# Patient Record
Sex: Female | Born: 2000 | Race: White | Hispanic: No | Marital: Single | Smoking: Never smoker
Health system: Southern US, Community
[De-identification: ages and names within clinical notes are randomized; demographics above are authoritative.]

## PROBLEM LIST (undated history)

## (undated) DIAGNOSIS — R112 Nausea with vomiting, unspecified: Secondary | ICD-10-CM

## (undated) DIAGNOSIS — F419 Anxiety disorder, unspecified: Secondary | ICD-10-CM

## (undated) DIAGNOSIS — E119 Type 2 diabetes mellitus without complications: Secondary | ICD-10-CM

## (undated) DIAGNOSIS — J45909 Unspecified asthma, uncomplicated: Secondary | ICD-10-CM

## (undated) DIAGNOSIS — F32A Depression, unspecified: Secondary | ICD-10-CM

## (undated) HISTORY — PX: ADENOIDECTOMY: SUR15

## (undated) HISTORY — PX: APPENDECTOMY: SHX54

## (undated) HISTORY — DX: Nausea with vomiting, unspecified: R11.2

## (undated) HISTORY — PX: OTHER SURGICAL HISTORY: SHX169

---
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
## 2018-01-09 ENCOUNTER — Ambulatory Visit: Payer: PRIVATE HEALTH INSURANCE | Admitting: Pediatrics

## 2018-01-19 ENCOUNTER — Other Ambulatory Visit: Payer: Self-pay | Admitting: Pediatrics

## 2018-01-19 DIAGNOSIS — N915 Oligomenorrhea, unspecified: Secondary | ICD-10-CM

## 2018-02-15 ENCOUNTER — Other Ambulatory Visit: Payer: Self-pay | Admitting: Pediatrics

## 2018-02-15 DIAGNOSIS — N915 Oligomenorrhea, unspecified: Secondary | ICD-10-CM

## 2018-04-08 ENCOUNTER — Other Ambulatory Visit: Payer: Self-pay | Admitting: Pediatrics

## 2018-04-08 DIAGNOSIS — J309 Allergic rhinitis, unspecified: Secondary | ICD-10-CM

## 2018-04-08 DIAGNOSIS — J4520 Mild intermittent asthma, uncomplicated: Secondary | ICD-10-CM

## 2018-05-14 ENCOUNTER — Encounter (HOSPITAL_COMMUNITY): Payer: Self-pay | Admitting: Emergency Medicine

## 2018-05-14 ENCOUNTER — Other Ambulatory Visit: Payer: Self-pay

## 2018-05-14 ENCOUNTER — Emergency Department (HOSPITAL_COMMUNITY)
Admission: EM | Admit: 2018-05-14 | Discharge: 2018-05-15 | Disposition: A | Discharge status: Home / Self Care | Payer: PRIVATE HEALTH INSURANCE | Attending: Emergency Medicine | Admitting: Emergency Medicine

## 2018-05-14 DIAGNOSIS — R51 Headache: Secondary | ICD-10-CM | POA: Diagnosis not present

## 2018-05-14 DIAGNOSIS — Z79899 Other long term (current) drug therapy: Secondary | ICD-10-CM | POA: Insufficient documentation

## 2018-05-14 DIAGNOSIS — J45909 Unspecified asthma, uncomplicated: Secondary | ICD-10-CM | POA: Diagnosis not present

## 2018-05-14 DIAGNOSIS — R519 Headache, unspecified: Secondary | ICD-10-CM

## 2018-05-14 LAB — GROUP A STREP BY PCR: Group A Strep by PCR: NOT DETECTED

## 2018-05-14 MED ORDER — KETOROLAC TROMETHAMINE 30 MG/ML IJ SOLN
30.0000 mg | Freq: Once | INTRAMUSCULAR | Status: AC
  Administered 2018-05-14: 30 mg via INTRAVENOUS
  Filled 2018-05-14: qty 1

## 2018-05-14 MED ORDER — METOCLOPRAMIDE HCL 5 MG/ML IJ SOLN
10.0000 mg | Freq: Once | INTRAMUSCULAR | Status: AC
  Administered 2018-05-14: 10 mg via INTRAVENOUS
  Filled 2018-05-14: qty 2

## 2018-05-14 MED ORDER — DIPHENHYDRAMINE HCL 25 MG PO CAPS
25.0000 mg | ORAL_CAPSULE | Freq: Once | ORAL | Status: AC
  Administered 2018-05-14: 25 mg via ORAL
  Filled 2018-05-14: qty 1

## 2018-05-14 MED ORDER — SODIUM CHLORIDE 0.9 % IV BOLUS
1000.0000 mL | Freq: Once | INTRAVENOUS | Status: AC
  Administered 2018-05-14: 1000 mL via INTRAVENOUS

## 2018-05-14 NOTE — ED Triage Notes (Signed)
Pt c/o headache since Sunday with n/v.

## 2018-05-15 MED ORDER — BUTALBITAL-APAP-CAFFEINE 50-325-40 MG PO TABS: 1.0000 | ORAL_TABLET | ORAL | 0 refills | Status: DC | PRN

## 2018-05-15 NOTE — Discharge Instructions (Addendum)
Follow-up with your doctor for recheck if needed.  Return here for any worsening symptoms

## 2018-05-16 NOTE — ED Provider Notes (Signed)
[facility]   CSN: [id number] Arrival date & time: 05/14/18  2038     History   Chief Complaint Chief Complaint  Patient presents with  . Headache    HPI Ruth DixonSierra M King is a 17 y.o. female.  HPI   Ruth DixonSierra M King is a 17 y.o. female who presents to the Emergency Department complaining of gradual onset of frontal headache for 1 day.  She describes the pain is a throbbing sensation across the front of her head and into her temples.  Headache is associated with nausea and vomiting.  She has been able to keep down small amounts of fluids.  She reports history of headaches that are similar to her current headache.  She is tried over-the-counter medications with minimal relief.  She denies sudden onset, neck pain, neck stiffness, fever, chest pain, shortness of breath, or visual changes.   Past Medical History:  Diagnosis Date  . Asthma   . Nausea & vomiting     Patient Active Problem List   Diagnosis Date Noted  . 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 Asthma, chronic 02/25/2016  . PCOS (polycystic ovarian syndrome) 09/23/2015  . Development delay 09/23/2015  . Learning disability 09/23/2015  . Nausea & vomiting     Past Surgical History:  Procedure Laterality Date  . ADENOIDECTOMY    . APPENDECTOMY    . tubes in ears       OB History   None      Home Medications    Prior to Admission medications   Medication Sig Start Date End Date Taking? Authorizing Provider  albuterol (PROVENTIL HFA;VENTOLIN HFA) 108 (90 Base) MCG/ACT inhaler Inhale 2 puffs into the lungs every 6 (six) hours as needed for wheezing or shortness of breath. 05/26/16  Yes Dettinger, Elige RadonJoshua A, MD  cetirizine (ZYRTEC) 10 MG tablet TAKE ONE TABLET BY MOUTH EVERY DAY Patient taking differently: Take 10 mg by mouth at bedtime.  04/09/18  Yes [name], Carol L, MD  fluticasone (FLONASE) 50 MCG/ACT nasal spray Place 2 sprays into both nostrils daily. Patient  taking differently: Place 2 sprays into both nostrils daily as needed for allergies or rhinitis.  05/26/16  Yes Dettinger, Elige RadonJoshua A, MD  LARIN FE 1.5/30 1.5-30 MG-MCG tablet TAKE 1 TABLET BY MOUTH DAILY Patient taking differently: Take 1 tablet by mouth at bedtime.  02/15/18  Yes [name], Carol L, MD  montelukast (SINGULAIR) 10 MG tablet TAKE ONE TABLET BY MOUTH AT BEDTIME Patient taking differently: Take 10 mg by mouth every morning.  10/30/17  Yes [name], Carol L, MD  butalbital-acetaminophen-caffeine (FIORICET, ESGIC) [id number] MG tablet Take 1 tablet by mouth every 4 (four) hours as needed for headache. 05/15/18 05/15/19  Pauline Ausriplett, Roselynne Lortz, PA-C    Family History Family History  Problem Relation Age of Onset  . Asthma Mother   . Gout Father   . Seizures Maternal Grandmother     Social History Social History   Tobacco Use  . Smoking status: Never Smoker  . Smokeless tobacco: Never Used  Substance Use Topics  . Alcohol use: No  . Drug use: No     Allergies   Bee venom; Amoxicillin; Azithromycin; and Cephalosporins   Review of Systems Review of Systems  Constitutional: Positive for appetite change. Negative for activity change and fever.  HENT: Negative for facial swelling and trouble swallowing.   Eyes: Negative for photophobia, pain and visual disturbance.  Respiratory: Negative for shortness of breath.   Cardiovascular:  Negative for chest pain.  Gastrointestinal: Positive for nausea and vomiting.  Musculoskeletal: Negative for neck pain and neck stiffness.  Skin: Negative for rash and wound.  Neurological: Positive for headaches. Negative for dizziness, syncope, facial asymmetry, speech difficulty, weakness and numbness.  Psychiatric/Behavioral: Negative for confusion and decreased concentration.  All other systems reviewed and are negative.    Physical Exam Updated Vital Signs BP 123/62 (BP Location: Left Arm)   Pulse 77   Temp 99.4 F (37.4 C) (Oral)   Resp 18    Ht 6' (1.829 m)   Wt 113.4 kg   LMP 05/06/2018   SpO2 100%   BMI 33.91 kg/m   Physical Exam  Constitutional: She is oriented to person, place, and time. She appears well-developed and well-nourished. No distress.  HENT:  Head: Normocephalic.  Mouth/Throat: Oropharynx is clear and moist.  Eyes: Pupils are equal, round, and reactive to light. Conjunctivae and EOM are normal.  Neck: Normal range of motion and phonation normal. Neck supple. No spinous process tenderness and no muscular tenderness present. No neck rigidity. No Kernig's sign noted.  Cardiovascular: Normal rate, regular rhythm and intact distal pulses.  Pulmonary/Chest: Effort normal and breath sounds normal. No respiratory distress.  Abdominal: Soft. She exhibits no distension. There is no tenderness.  Musculoskeletal: Normal range of motion.  Neurological: She is alert and oriented to person, place, and time. She has normal strength. No cranial nerve deficit or sensory deficit. Gait normal. GCS eye subscore is 4. GCS verbal subscore is 5. GCS motor subscore is 6.  Reflex Scores:      Tricep reflexes are 2+ on the right side and 2+ on the left side.      Bicep reflexes are 2+ on the right side and 2+ on the left side. CN III-XII grossly intact.  Speech clear, no pronator drift.  Grip strength is strong and symmetrical bilaterally.  Nml finger nose testing bilaterally  Skin: Skin is warm and dry. Capillary refill takes less than 2 seconds. No rash noted.  Psychiatric: She has a normal mood and affect. Thought content normal.  Nursing note and vitals reviewed.    ED Treatments / Results  Labs (all labs ordered are listed, but only abnormal results are displayed) Labs Reviewed  GROUP A STREP BY PCR    EKG None  Radiology No results found.  Procedures Procedures (including critical care time)  Medications Ordered in ED Medications  sodium chloride 0.9 % bolus 1,000 mL (0 mLs Intravenous Stopped 05/15/18 0034)    ketorolac (TORADOL) 30 MG/ML injection 30 mg (30 mg Intravenous Given 05/14/18 2255)  diphenhydrAMINE (BENADRYL) capsule 25 mg (25 mg Oral Given 05/14/18 2255)  metoCLOPramide (REGLAN) injection 10 mg (10 mg Intravenous Given 05/14/18 2255)     Initial Impression / Assessment and Plan / ED Course  I have reviewed the triage vital signs and the nursing notes.  Pertinent labs & imaging results that were available during my care of the patient were reviewed by me and considered in my medical decision making (see chart for details).     Patient with gradual onset of frontal headache.  No nuchal rigidity.  Labs reviewed.  Abdomen is soft and nontender on exam.  On recheck, patient reports feeling better after medications.  Nausea has resolved.  She states that she is ready for discharge home agrees to close outpatient follow-up if needed.  Return precautions discussed.  Final Clinical Impressions(s) / ED Diagnoses   Final diagnoses:  Headache  disorder    ED Discharge Orders         Ordered    butalbital-acetaminophen-caffeine (FIORICET, ESGIC) 50-325-40 MG tablet  Every 4 hours PRN     05/15/18 0033           Pauline Ausriplett, Elayah Klooster, PA-C 05/16/18 1309    Bethann BerkshireZammit, Joseph, MD 05/17/18 1219

## 2018-06-08 ENCOUNTER — Other Ambulatory Visit: Payer: Self-pay | Admitting: Pediatrics

## 2018-06-08 DIAGNOSIS — N915 Oligomenorrhea, unspecified: Secondary | ICD-10-CM

## 2018-06-08 NOTE — Telephone Encounter (Signed)
Last seen 12/27/08  Dr Darlyn Read  Dr Oswaldo Done PCP

## 2018-06-09 NOTE — Telephone Encounter (Signed)
Will call to schedule appt  

## 2018-06-20 ENCOUNTER — Encounter: Payer: Self-pay | Admitting: Family Medicine

## 2018-06-20 ENCOUNTER — Ambulatory Visit (INDEPENDENT_AMBULATORY_CARE_PROVIDER_SITE_OTHER): Payer: PRIVATE HEALTH INSURANCE | Admitting: Family Medicine

## 2018-06-20 VITALS — BP 150/100 | HR 97 | Temp 99.0°F | Ht 72.0 in | Wt 281.0 lb

## 2018-06-20 DIAGNOSIS — J069 Acute upper respiratory infection, unspecified: Secondary | ICD-10-CM | POA: Diagnosis not present

## 2018-06-20 LAB — CULTURE, GROUP A STREP

## 2018-06-20 LAB — RAPID STREP SCREEN (MED CTR MEBANE ONLY): Strep Gp A Ag, IA W/Reflex: NEGATIVE

## 2018-06-20 NOTE — Progress Notes (Signed)
BP (!) 150/100   Pulse 97   Temp 99 F (37.2 C) (Oral)   Ht 6' (1.829 m)   Wt 281 lb (127.5 kg)   BMI 38.11 kg/m    Subjective:    Patient ID: Ruth King, female    DOB: 01/31/2001, 17 y.o.   MRN: [id number]  HPI: Ruth King is a 17 y.o. female presenting on 06/20/2018 for Sore Throat (Wants meds for headaches refilled and inhaler); Headache; Nausea; and Generalized Body Aches   HPI Cough and body aches and congestion and sore throat Patient is coming in for cough and congestion and body aches and sore throat this been going on for just under a day.  She says it all started yesterday and is been worsening overnight.  She does admit to having a sister that is been sick since 3 days ago with similar symptoms but she just started last night.  She went to work today and just was generally not feeling well and achy so she came home from work and that is why she is here today.  She denies having used anything for it yet but is trying to stay hydrated.  She says she has been getting a little bit of diarrhea today and a little bit of nausea today as well.  She normally takes Singulair and Flonase and Zyrtec as needed.  Relevant past medical, surgical, family and social history reviewed and updated as indicated. Interim medical history since our last visit reviewed. Allergies and medications reviewed and updated.  Review of Systems  Constitutional: Positive for chills and fever.  HENT: Positive for congestion, postnasal drip, rhinorrhea, sinus pressure, sneezing and sore throat. Negative for ear discharge and ear pain.   Eyes: Negative for pain, redness and visual disturbance.  Respiratory: Positive for cough. Negative for chest tightness and shortness of breath.   Cardiovascular: Negative for chest pain and leg swelling.  Genitourinary: Negative for difficulty urinating and dysuria.  Musculoskeletal: Positive for myalgias. Negative for back pain and gait problem.  Skin: Negative  for rash.  Neurological: Negative for light-headedness and headaches.  Psychiatric/Behavioral: Negative for agitation and behavioral problems.  All other systems reviewed and are negative.   Per HPI unless specifically indicated above   Allergies as of 06/20/2018      Reactions   Bee Venom Shortness Of Breath   Amoxicillin Hives   Azithromycin Other (See Comments)   Not effective   Cephalosporins Hives      Medication List        Accurate as of 06/20/18  6:45 PM. Always use your most recent med list.          albuterol 108 (90 Base) MCG/ACT inhaler Commonly known as:  PROVENTIL HFA;VENTOLIN HFA Inhale 2 puffs into the lungs every 6 (six) hours as needed for wheezing or shortness of breath.   butalbital-acetaminophen-caffeine 50-325-40 MG tablet Commonly known as:  FIORICET, ESGIC Take 1 tablet by mouth every 4 (four) hours as needed for headache.   cetirizine 10 MG tablet Commonly known as:  ZYRTEC TAKE ONE TABLET BY MOUTH EVERY DAY   fluticasone 50 MCG/ACT nasal spray Commonly known as:  FLONASE Place 2 sprays into both nostrils daily.   montelukast 10 MG tablet Commonly known as:  SINGULAIR TAKE ONE TABLET BY MOUTH AT BEDTIME   norethindrone-ethinyl estradiol-iron 1.5-30 MG-MCG tablet Commonly known as:  MICROGESTIN FE,GILDESS FE,LOESTRIN FE Take 1 tablet by mouth daily. Needs to be seen for more refills  Objective:    BP (!) 150/100   Pulse 97   Temp 99 F (37.2 C) (Oral)   Ht 6' (1.829 m)   Wt 281 lb (127.5 kg)   BMI 38.11 kg/m   Wt Readings from Last 3 Encounters:  06/20/18 281 lb (127.5 kg) (>99 %, Z= 2.67)*  05/14/18 250 lb (113.4 kg) (>99 %, Z= 2.47)*  12/27/17 263 lb 8 oz (119.5 kg) (>99 %, Z= 2.54)*   * Growth percentiles are based on CDC (Girls, 2-20 Years) data.    Physical Exam  Constitutional: She is oriented to person, place, and time. She appears well-developed and well-nourished. No distress.  HENT:  Right Ear: Tympanic  membrane, external ear and ear canal normal.  Left Ear: Tympanic membrane, external ear and ear canal normal.  Nose: Mucosal edema and rhinorrhea present. No epistaxis. Right sinus exhibits no maxillary sinus tenderness and no frontal sinus tenderness. Left sinus exhibits no maxillary sinus tenderness and no frontal sinus tenderness.  Mouth/Throat: Uvula is midline and mucous membranes are normal. Posterior oropharyngeal edema present. No oropharyngeal exudate, posterior oropharyngeal erythema or tonsillar abscesses.  Eyes: Conjunctivae are normal.  Cardiovascular: Normal rate, regular rhythm, normal heart sounds and intact distal pulses.  No murmur heard. Pulmonary/Chest: Effort normal and breath sounds normal. No respiratory distress. She has no wheezes.  Musculoskeletal: Normal range of motion. She exhibits no edema.  Neurological: She is alert and oriented to person, place, and time. Coordination normal.  Skin: Skin is warm and dry. No rash noted. She is not diaphoretic.  Psychiatric: She has a normal mood and affect. Her behavior is normal.  Vitals reviewed.       Assessment & Plan:   Problem List Items Addressed This Visit    None    Visit Diagnoses    Viral upper respiratory infection    -  Primary   Relevant Orders   Rapid Strep Screen ([facility] ONLY)    Recommended Flonase and Mucinex and ibuprofen and nasal saline and humidifier and call back if worsens or does not improve after 5 to 10 days.  Follow up plan: Return if symptoms worsen or fail to improve.  Counseling provided for all of the vaccine components Orders Placed This Encounter  Procedures  . Rapid Strep Screen ([facility] ONLY)    Arville Care, MD [facility] 06/20/2018, 6:45 PM

## 2018-06-28 ENCOUNTER — Other Ambulatory Visit: Payer: Self-pay | Admitting: *Deleted

## 2018-06-28 DIAGNOSIS — J309 Allergic rhinitis, unspecified: Secondary | ICD-10-CM

## 2018-06-28 DIAGNOSIS — J4520 Mild intermittent asthma, uncomplicated: Secondary | ICD-10-CM

## 2018-06-28 MED ORDER — MONTELUKAST SODIUM 10 MG PO TABS: 10.0000 mg | ORAL_TABLET | Freq: Every day | ORAL | 0 refills | Status: DC

## 2018-07-02 DIAGNOSIS — Z79899 Other long term (current) drug therapy: Secondary | ICD-10-CM | POA: Diagnosis not present

## 2018-07-02 DIAGNOSIS — J45909 Unspecified asthma, uncomplicated: Secondary | ICD-10-CM | POA: Diagnosis not present

## 2018-07-02 DIAGNOSIS — R1012 Left upper quadrant pain: Secondary | ICD-10-CM | POA: Diagnosis present

## 2018-07-03 ENCOUNTER — Emergency Department (HOSPITAL_COMMUNITY)
Admission: EM | Admit: 2018-07-03 | Discharge: 2018-07-03 | Disposition: A | Discharge status: Home / Self Care | Payer: PRIVATE HEALTH INSURANCE | Attending: Emergency Medicine | Admitting: Emergency Medicine

## 2018-07-03 ENCOUNTER — Encounter (HOSPITAL_COMMUNITY): Payer: Self-pay | Admitting: *Deleted

## 2018-07-03 ENCOUNTER — Other Ambulatory Visit: Payer: Self-pay

## 2018-07-03 DIAGNOSIS — R1012 Left upper quadrant pain: Secondary | ICD-10-CM

## 2018-07-03 LAB — CBC WITH DIFFERENTIAL/PLATELET
Basophils Absolute: 0 10*3/uL (ref 0.0–0.1)
Basophils Relative: 0 %
EOS ABS: 0.2 10*3/uL (ref 0.0–0.7)
EOS PCT: 1 %
HCT: 38.6 % (ref 36.0–46.0)
Hemoglobin: 12.5 g/dL (ref 12.0–15.0)
LYMPHS ABS: 2.9 10*3/uL (ref 0.7–4.0)
LYMPHS PCT: 25 %
MCH: 28.6 pg (ref 26.0–34.0)
MCHC: 32.4 g/dL (ref 30.0–36.0)
MCV: 88.3 fL (ref 78.0–100.0)
MONO ABS: 0.6 10*3/uL (ref 0.1–1.0)
MONOS PCT: 5 %
Neutro Abs: 8 10*3/uL — ABNORMAL HIGH (ref 1.7–7.7)
Neutrophils Relative %: 69 %
PLATELETS: 369 10*3/uL (ref 150–400)
RBC: 4.37 MIL/uL (ref 3.87–5.11)
RDW: 13.9 % (ref 11.5–15.5)
WBC: 11.7 10*3/uL — ABNORMAL HIGH (ref 4.0–10.5)

## 2018-07-03 LAB — COMPREHENSIVE METABOLIC PANEL
ALBUMIN: 3.4 g/dL — AB (ref 3.5–5.0)
ALK PHOS: 71 U/L (ref 38–126)
ALT: 29 U/L (ref 0–44)
ANION GAP: 7 (ref 5–15)
AST: 29 U/L (ref 15–41)
BILIRUBIN TOTAL: 0.2 mg/dL — AB (ref 0.3–1.2)
BUN: 11 mg/dL (ref 6–20)
CALCIUM: 8.7 mg/dL — AB (ref 8.9–10.3)
CO2: 22 mmol/L (ref 22–32)
CREATININE: 0.52 mg/dL (ref 0.44–1.00)
Chloride: 111 mmol/L (ref 98–111)
GFR calc Af Amer: >60 mL/min (ref >60)
GFR calc non Af Amer: >60 mL/min (ref >60)
GLUCOSE: 166 mg/dL — AB (ref 70–99)
Potassium: 3.8 mmol/L (ref 3.5–5.1)
SODIUM: 140 mmol/L (ref 135–145)
Total Protein: 6.9 g/dL (ref 6.5–8.1)

## 2018-07-03 LAB — POC URINE PREG, ED: Preg Test, Ur: NEGATIVE

## 2018-07-03 LAB — URINALYSIS, ROUTINE W REFLEX MICROSCOPIC
Bilirubin Urine: NEGATIVE
Glucose, UA: 150 mg/dL — AB
Hgb urine dipstick: NEGATIVE
KETONES UR: NEGATIVE mg/dL
Leukocytes, UA: NEGATIVE
NITRITE: NEGATIVE
PROTEIN: NEGATIVE mg/dL
SPECIFIC GRAVITY, URINE: 1.02 (ref 1.005–1.030)
pH: 6 (ref 5.0–8.0)

## 2018-07-03 LAB — LIPASE, BLOOD: Lipase: 28 U/L (ref 11–51)

## 2018-07-03 MED ORDER — GI COCKTAIL ~~LOC~~
30.0000 mL | Freq: Once | ORAL | Status: AC
  Administered 2018-07-03: 30 mL via ORAL

## 2018-07-03 MED ORDER — DICYCLOMINE HCL 10 MG PO CAPS
ORAL_CAPSULE | ORAL | Status: AC
  Filled 2018-07-03: qty 1

## 2018-07-03 MED ORDER — DICYCLOMINE HCL 20 MG PO TABS: 20.0000 mg | ORAL_TABLET | Freq: Three times a day (TID) | ORAL | 0 refills | Status: DC

## 2018-07-03 MED ORDER — DICYCLOMINE HCL 10 MG PO CAPS
10.0000 mg | ORAL_CAPSULE | Freq: Once | ORAL | Status: AC
  Administered 2018-07-03: 10 mg via ORAL

## 2018-07-03 MED ORDER — PANTOPRAZOLE SODIUM 40 MG PO TBEC: 40.0000 mg | DELAYED_RELEASE_TABLET | Freq: Every day | ORAL | 2 refills | Status: DC

## 2018-07-03 MED ORDER — GI COCKTAIL ~~LOC~~
ORAL | Status: AC
  Filled 2018-07-03: qty 30

## 2018-07-03 NOTE — ED Provider Notes (Signed)
[facility] EMERGENCY DEPARTMENT Provider Note   CSN: [id number] Arrival date & time: 07/02/18  2357     History   Chief Complaint Chief Complaint  Patient presents with  . Abdominal Pain    HPI Ruth King is a 17 y.o. female.  Patient presents to the ER for abdominal pain.  Patient reports that she had onset of left upper abdominal pain after dinner tonight.  Initially the pain was severe and constant, now is improving somewhat.  She reports that the pain is waxing, waning, coming and going currently.  Initially she had nausea but that has resolved.  She has not had any diarrhea or constipation.  Denies urinary symptoms.  She has not had any fever.     Past Medical History:  Diagnosis Date  . Asthma   . Nausea & vomiting     Patient Active Problem List   Diagnosis Date Noted  . 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 Asthma, chronic 02/25/2016  . PCOS (polycystic ovarian syndrome) 09/23/2015  . Development delay 09/23/2015  . Learning disability 09/23/2015  . Nausea & vomiting     Past Surgical History:  Procedure Laterality Date  . ADENOIDECTOMY    . APPENDECTOMY    . tubes in ears       OB History   None      Home Medications    Prior to Admission medications   Medication Sig Start Date End Date Taking? Authorizing Provider  albuterol (PROVENTIL HFA;VENTOLIN HFA) 108 (90 Base) MCG/ACT inhaler Inhale 2 puffs into the lungs every 6 (six) hours as needed for wheezing or shortness of breath. 05/26/16   Dettinger, Elige Radon, MD  butalbital-acetaminophen-caffeine (FIORICET, ESGIC) [phone] MG tablet Take 1 tablet by mouth every 4 (four) hours as needed for headache. 05/15/18 05/15/19  Triplett, Tammy, PA-C  cetirizine (ZYRTEC) 10 MG tablet TAKE ONE TABLET BY MOUTH EVERY DAY Patient taking differently: Take 10 mg by mouth at bedtime.  04/09/18   Johna Sheriff, MD  dicyclomine (BENTYL) 20 MG tablet Take 1 tablet (20 mg total) by mouth 3 (three) times  daily before meals. 07/03/18   Gilda Crease, MD  fluticasone (FLONASE) 50 MCG/ACT nasal spray Place 2 sprays into both nostrils daily. Patient taking differently: Place 2 sprays into both nostrils daily as needed for allergies or rhinitis.  05/26/16   Dettinger, Elige Radon, MD  montelukast (SINGULAIR) 10 MG tablet Take 1 tablet (10 mg total) by mouth at bedtime. (Needs to be seen before next refill) 06/28/18   Johna Sheriff, MD  norethindrone-ethinyl estradiol-iron Elmarie Shiley FE 1.5/30) 1.5-30 MG-MCG tablet Take 1 tablet by mouth daily. Needs to be seen for more refills 06/08/18   Johna Sheriff, MD  pantoprazole (PROTONIX) 40 MG tablet Take 1 tablet (40 mg total) by mouth daily. 07/03/18   Gilda Crease, MD    Family History Family History  Problem Relation Age of Onset  . Asthma Mother   . Gout Father   . Seizures Maternal Grandmother     Social History Social History   Tobacco Use  . Smoking status: Never Smoker  . Smokeless tobacco: Never Used  Substance Use Topics  . Alcohol use: No  . Drug use: No     Allergies   Bee venom; Amoxicillin; Azithromycin; and Cephalosporins   Review of Systems Review of Systems  Gastrointestinal: Positive for abdominal pain and nausea.  All other systems reviewed and are negative.    Physical Exam  Updated Vital Signs BP 124/78 (BP Location: Right Arm)   Pulse 97   Temp 97.9 F (36.6 C) (Oral)   Resp 18   Ht 6' (1.829 m)   Wt 127.4 kg   LMP 06/06/2018   SpO2 100%   BMI 38.09 kg/m   Physical Exam  Constitutional: She is oriented to person, place, and time. She appears well-developed and well-nourished. No distress.  HENT:  Head: Normocephalic and atraumatic.  Right Ear: Hearing normal.  Left Ear: Hearing normal.  Nose: Nose normal.  Mouth/Throat: Oropharynx is clear and moist and mucous membranes are normal.  Eyes: Pupils are equal, round, and reactive to light. Conjunctivae and EOM are normal.  Neck: Normal  range of motion. Neck supple.  Cardiovascular: Regular rhythm, S1 normal and S2 normal. Exam reveals no gallop and no friction rub.  No murmur heard. Pulmonary/Chest: Effort normal and breath sounds normal. No respiratory distress. She exhibits no tenderness.  Abdominal: Soft. Normal appearance and bowel sounds are normal. There is no hepatosplenomegaly. There is tenderness in the left upper quadrant. There is no rebound, no guarding, no tenderness at McBurney's point and negative Murphy's sign. No hernia.  Musculoskeletal: Normal range of motion.  Neurological: She is alert and oriented to person, place, and time. She has normal strength. No cranial nerve deficit or sensory deficit. Coordination normal. GCS eye subscore is 4. GCS verbal subscore is 5. GCS motor subscore is 6.  Skin: Skin is warm, dry and intact. No rash noted. No cyanosis.  Psychiatric: She has a normal mood and affect. Her speech is normal and behavior is normal. Thought content normal.  Nursing note and vitals reviewed.    ED Treatments / Results  Labs (all labs ordered are listed, but only abnormal results are displayed) Labs Reviewed  CBC WITH DIFFERENTIAL/PLATELET - Abnormal; Notable for the following components:      Result Value   WBC 11.7 (*)    Neutro Abs 8.0 (*)    All other components within normal limits  COMPREHENSIVE METABOLIC PANEL - Abnormal; Notable for the following components:   Glucose, Bld 166 (*)    Calcium 8.7 (*)    Albumin 3.4 (*)    Total Bilirubin 0.2 (*)    All other components within normal limits  URINALYSIS, ROUTINE W REFLEX MICROSCOPIC - Abnormal; Notable for the following components:   Glucose, UA 150 (*)    All other components within normal limits  LIPASE, BLOOD  POC URINE PREG, ED    EKG None  Radiology No results found.  Procedures Procedures (including critical care time)  Medications Ordered in ED Medications  gi cocktail suspension (has no administration in time  range)  dicyclomine (BENTYL) 10 MG capsule (has no administration in time range)  gi cocktail (Maalox,Lidocaine,Donnatal) (30 mLs Oral Given 07/03/18 0127)  dicyclomine (BENTYL) capsule 10 mg (10 mg Oral Given 07/03/18 0127)     Initial Impression / Assessment and Plan / ED Course  I have reviewed the triage vital signs and the nursing notes.  Pertinent labs & imaging results that were available during my care of the patient were reviewed by me and considered in my medical decision making (see chart for details).     Resents to the ER for evaluation of abdominal pain that began after eating spaghetti for dinner tonight.  Pain was left upper abdomen.  Initially the pain was constant but at arrival to the ER it was coming and going.  She was given GI  cocktail and Bentyl and pain has completely resolved.  Her examination when she was having pain revealed very slight epigastric discomfort, predominantly left upper quadrant tenderness.  She did not have any right upper quadrant tenderness, negative Murphy's.  LFTs normal.  Lipase normal.  Urinalysis unremarkable.  Likely related to acid reflux, possibly intestinal cramping.  She has been having normal bowel movements, no concern for obstruction.  Will treat with Protonix, follow-up with GI if no improvement.  Final Clinical Impressions(s) / ED Diagnoses   Final diagnoses:  Left upper quadrant pain    ED Discharge Orders         Ordered    pantoprazole (PROTONIX) 40 MG tablet  Daily     07/03/18 0218    dicyclomine (BENTYL) 20 MG tablet  3 times daily before meals     07/03/18 0218           Gilda Crease, MD 07/03/18 [phone]

## 2018-07-03 NOTE — ED Triage Notes (Signed)
Pt c/o left upper abdominal pain that started tonight; pt states she feels nauseous but has not vomited; pt denies any diarrhea and states her last BM was today and it was normal

## 2018-07-05 ENCOUNTER — Other Ambulatory Visit: Payer: Self-pay | Admitting: Pediatrics

## 2018-07-05 DIAGNOSIS — N915 Oligomenorrhea, unspecified: Secondary | ICD-10-CM

## 2018-07-16 ENCOUNTER — Ambulatory Visit: Payer: PRIVATE HEALTH INSURANCE | Admitting: Pediatrics

## 2018-07-16 ENCOUNTER — Encounter: Payer: Self-pay | Admitting: Pediatrics

## 2018-07-16 VITALS — BP 119/81 | HR 89 | Temp 98.1°F | Ht 72.0 in | Wt 272.8 lb

## 2018-07-16 DIAGNOSIS — N915 Oligomenorrhea, unspecified: Secondary | ICD-10-CM

## 2018-07-16 DIAGNOSIS — I10 Essential (primary) hypertension: Secondary | ICD-10-CM

## 2018-07-16 DIAGNOSIS — J309 Allergic rhinitis, unspecified: Secondary | ICD-10-CM

## 2018-07-16 MED ORDER — NORETHIN ACE-ETH ESTRAD-FE 1.5-30 MG-MCG PO TABS: 1.0000 | ORAL_TABLET | Freq: Every day | ORAL | 1 refills | Status: DC

## 2018-07-16 MED ORDER — CETIRIZINE HCL 10 MG PO TABS: 10.0000 mg | ORAL_TABLET | Freq: Every day | ORAL | 3 refills | Status: DC

## 2018-07-16 MED ORDER — MONTELUKAST SODIUM 10 MG PO TABS: 10.0000 mg | ORAL_TABLET | Freq: Every day | ORAL | 3 refills | Status: DC

## 2018-07-16 NOTE — Progress Notes (Signed)
  Subjective:   Patient ID: Ruth King, female    DOB: 04/03/2001, 17 y.o.   MRN: [id number] CC: Contraception  HPI: Ruth King is a 17 y.o. female   Has been feeling well. No trouble recently with knee. Graduated from high school this past spring.  Now working at [employer] in [place], lifting, walking, carrying things all day.  Trying to keep sodas down, drinks sometimes one a day, sometimes a gatorade or green tea as well. Drinking water regularly.  Having regular periods on OCP.  Some ongoing allergy symptoms, cetirizine and singulair help some.  Relevant past medical, surgical, family and social history reviewed. Allergies and medications reviewed and updated. Social History   Tobacco Use  Smoking Status Never Smoker  Smokeless Tobacco Never Used   ROS: Per HPI   Objective:    BP 119/81   Pulse 89   Temp 98.1 F (36.7 C) (Oral)   Ht 6' (1.829 m)   Wt 272 lb 12.8 oz (123.7 kg)   BMI 37.00 kg/m   Wt Readings from Last 3 Encounters:  07/16/18 272 lb 12.8 oz (123.7 kg) (>99 %, Z= 2.63)*  07/03/18 280 lb 13.9 oz (127.4 kg) (>99 %, Z= 2.67)*  06/20/18 281 lb (127.5 kg) (>99 %, Z= 2.67)*   * Growth percentiles are based on CDC (Girls, 2-20 Years) data.    Gen: NAD, alert, cooperative with exam, NCAT EYES: EOMI, no conjunctival injection, or no icterus CV: NRRR, normal S1/S2, no murmur, distal pulses 2+ b/l Resp: CTABL, no wheezes, normal WOB Abd: +BS, soft, NTND. Ext: No edema, warm Neuro: Alert and oriented, strength equal b/l UE and LE, coordination grossly normal MSK: normal muscle bulk  Assessment & Plan:  Ruth King was seen today for contraception.  Diagnoses and all orders for this visit:  Hypertension, unspecified type Gave information for DASH eating plan, will try lifestyle modifications first. Follow up in 4 weeks.  Oligomenorrhea, unspecified type Continue below -     norethindrone-ethinyl estradiol-iron (LARIN FE 1.5/30) 1.5-30 MG-MCG tablet;  Take 1 tablet by mouth daily. (Needs to be seen before next refill)  Allergic rhinitis Stable, continue below -     montelukast (SINGULAIR) 10 MG tablet; Take 1 tablet (10 mg total) by mouth at bedtime. (Needs to be seen before next refill) -     cetirizine (ZYRTEC) 10 MG tablet; Take 1 tablet (10 mg total) by mouth at bedtime.  Declines flu shot  Follow up plan: Return in about 4 weeks (around 08/13/2018) for well exam. Rex Kras, MD [facility]

## 2018-07-16 NOTE — Patient Instructions (Signed)
DASH Eating Plan DASH stands for "Dietary Approaches to Stop Hypertension." The DASH eating plan is a healthy eating plan that has been shown to reduce high blood pressure (hypertension). It may also reduce your risk for type 2 diabetes, heart disease, and stroke. The DASH eating plan may also help with weight loss. What are tips for following this plan? General guidelines  Avoid eating more than 2,300 mg (milligrams) of salt (sodium) a day. If you have hypertension, you may need to reduce your sodium intake to 1,500 mg a day.  Limit alcohol intake to no more than 1 drink a day for nonpregnant women and 2 drinks a day for men. One drink equals 12 oz of beer, 5 oz of wine, or 1 oz of hard liquor.  Work with your health care provider to maintain a healthy body weight or to lose weight. Ask what an ideal weight is for you.  Get at least 30 minutes of exercise that causes your heart to beat faster (aerobic exercise) most days of the week. Activities may include walking, swimming, or biking.  Work with your health care provider or diet and nutrition specialist (dietitian) to adjust your eating plan to your individual calorie needs. Reading food labels  Check food labels for the amount of sodium per serving. Choose foods with less than 5 percent of the Daily Value of sodium. Generally, foods with less than 300 mg of sodium per serving fit into this eating plan.  To find whole grains, look for the word "whole" as the first word in the ingredient list. Shopping  Buy products labeled as "low-sodium" or "no salt added."  Buy fresh foods. Avoid canned foods and premade or frozen meals. Cooking  Avoid adding salt when cooking. Use salt-free seasonings or herbs instead of table salt or sea salt. Check with your health care provider or pharmacist before using salt substitutes.  Do not fry foods. Cook foods using healthy methods such as baking, boiling, grilling, and broiling instead.  Cook with  heart-healthy oils, such as olive, canola, soybean, or sunflower oil. Meal planning   Eat a balanced diet that includes: ? 5 or more servings of fruits and vegetables each day. At each meal, try to fill half of your plate with fruits and vegetables. ? Up to 6-8 servings of whole grains each day. ? Less than 6 oz of lean meat, poultry, or fish each day. A 3-oz serving of meat is about the same size as a deck of cards. One egg equals 1 oz. ? 2 servings of low-fat dairy each day. ? A serving of nuts, seeds, or beans 5 times each week. ? Heart-healthy fats. Healthy fats called Omega-3 fatty acids are found in foods such as flaxseeds and coldwater fish, like sardines, salmon, and mackerel.  Limit how much you eat of the following: ? Canned or prepackaged foods. ? Food that is high in trans fat, such as fried foods. ? Food that is high in saturated fat, such as fatty meat. ? Sweets, desserts, sugary drinks, and other foods with added sugar. ? Full-fat dairy products.  Do not salt foods before eating.  Try to eat at least 2 vegetarian meals each week.  Eat more home-cooked food and less restaurant, buffet, and fast food.  When eating at a restaurant, ask that your food be prepared with less salt or no salt, if possible. What foods are recommended? The items listed may not be a complete list. Talk with your dietitian about what   dietary choices are best for you. Grains Whole-grain or whole-wheat bread. Whole-grain or whole-wheat pasta. Brown rice. Oatmeal. Quinoa. Bulgur. Whole-grain and low-sodium cereals. Pita bread. Low-fat, low-sodium crackers. Whole-wheat flour tortillas. Vegetables Fresh or frozen vegetables (raw, steamed, roasted, or grilled). Low-sodium or reduced-sodium tomato and vegetable juice. Low-sodium or reduced-sodium tomato sauce and tomato paste. Low-sodium or reduced-sodium canned vegetables. Fruits All fresh, dried, or frozen fruit. Canned fruit in natural juice (without  added sugar). Meat and other protein foods Skinless chicken or turkey. Ground chicken or turkey. Pork with fat trimmed off. Fish and seafood. Egg whites. Dried beans, peas, or lentils. Unsalted nuts, nut butters, and seeds. Unsalted canned beans. Lean cuts of beef with fat trimmed off. Low-sodium, lean deli meat. Dairy Low-fat (1%) or fat-free (skim) milk. Fat-free, low-fat, or reduced-fat cheeses. Nonfat, low-sodium ricotta or cottage cheese. Low-fat or nonfat yogurt. Low-fat, low-sodium cheese. Fats and oils Soft margarine without trans fats. Vegetable oil. Low-fat, reduced-fat, or light mayonnaise and salad dressings (reduced-sodium). Canola, safflower, olive, soybean, and sunflower oils. Avocado. Seasoning and other foods Herbs. Spices. Seasoning mixes without salt. Unsalted popcorn and pretzels. Fat-free sweets. What foods are not recommended? The items listed may not be a complete list. Talk with your dietitian about what dietary choices are best for you. Grains Baked goods made with fat, such as croissants, muffins, or some breads. Dry pasta or rice meal packs. Vegetables Creamed or fried vegetables. Vegetables in a cheese sauce. Regular canned vegetables (not low-sodium or reduced-sodium). Regular canned tomato sauce and paste (not low-sodium or reduced-sodium). Regular tomato and vegetable juice (not low-sodium or reduced-sodium). Pickles. Olives. Fruits Canned fruit in a light or heavy syrup. Fried fruit. Fruit in cream or butter sauce. Meat and other protein foods Fatty cuts of meat. Ribs. Fried meat. Bacon. Sausage. Bologna and other processed lunch meats. Salami. Fatback. Hotdogs. Bratwurst. Salted nuts and seeds. Canned beans with added salt. Canned or smoked fish. Whole eggs or egg yolks. Chicken or turkey with skin. Dairy Whole or 2% milk, cream, and half-and-half. Whole or full-fat cream cheese. Whole-fat or sweetened yogurt. Full-fat cheese. Nondairy creamers. Whipped toppings.  Processed cheese and cheese spreads. Fats and oils Butter. Stick margarine. Lard. Shortening. Ghee. Bacon fat. Tropical oils, such as coconut, palm kernel, or palm oil. Seasoning and other foods Salted popcorn and pretzels. Onion salt, garlic salt, seasoned salt, table salt, and sea salt. Worcestershire sauce. Tartar sauce. Barbecue sauce. Teriyaki sauce. Soy sauce, including reduced-sodium. Steak sauce. Canned and packaged gravies. Fish sauce. Oyster sauce. Cocktail sauce. Horseradish that you find on the shelf. Ketchup. Mustard. Meat flavorings and tenderizers. Bouillon cubes. Hot sauce and Tabasco sauce. Premade or packaged marinades. Premade or packaged taco seasonings. Relishes. Regular salad dressings. Where to find more information:  National Heart, Lung, and Blood Institute: www.nhlbi.nih.gov  American Heart Association: www.heart.org Summary  The DASH eating plan is a healthy eating plan that has been shown to reduce high blood pressure (hypertension). It may also reduce your risk for type 2 diabetes, heart disease, and stroke.  With the DASH eating plan, you should limit salt (sodium) intake to 2,300 mg a day. If you have hypertension, you may need to reduce your sodium intake to 1,500 mg a day.  When on the DASH eating plan, aim to eat more fresh fruits and vegetables, whole grains, lean proteins, low-fat dairy, and heart-healthy fats.  Work with your health care provider or diet and nutrition specialist (dietitian) to adjust your eating plan to your individual   calorie needs. This information is not intended to replace advice given to you by your health care provider. Make sure you discuss any questions you have with your health care provider. Document Released: 09/08/2011 Document Revised: 09/12/2016 Document Reviewed: 09/12/2016 Elsevier Interactive Patient Education  2018 [employer]  

## 2018-08-09 ENCOUNTER — Encounter: Payer: PRIVATE HEALTH INSURANCE | Admitting: Pediatrics

## 2018-08-20 ENCOUNTER — Encounter: Payer: Self-pay | Admitting: Pediatrics

## 2018-08-20 ENCOUNTER — Ambulatory Visit (INDEPENDENT_AMBULATORY_CARE_PROVIDER_SITE_OTHER): Payer: PRIVATE HEALTH INSURANCE | Admitting: Pediatrics

## 2018-08-20 VITALS — BP 132/73 | HR 94 | Temp 97.6°F | Ht 72.0 in | Wt 283.2 lb

## 2018-08-20 DIAGNOSIS — Z0001 Encounter for general adult medical examination with abnormal findings: Secondary | ICD-10-CM | POA: Diagnosis not present

## 2018-08-20 DIAGNOSIS — G43809 Other migraine, not intractable, without status migrainosus: Secondary | ICD-10-CM

## 2018-08-20 DIAGNOSIS — Z6838 Body mass index (BMI) 38.0-38.9, adult: Secondary | ICD-10-CM

## 2018-08-20 DIAGNOSIS — N915 Oligomenorrhea, unspecified: Secondary | ICD-10-CM

## 2018-08-20 DIAGNOSIS — Z0000 Encounter for general adult medical examination without abnormal findings: Secondary | ICD-10-CM

## 2018-08-20 LAB — BAYER DCA HB A1C WAIVED: HB A1C: 6.6 % (ref <7.0)

## 2018-08-20 MED ORDER — RIZATRIPTAN BENZOATE 10 MG PO TABS: 5.0000 mg | ORAL_TABLET | ORAL | 2 refills | Status: DC | PRN

## 2018-08-20 MED ORDER — NORETHIN ACE-ETH ESTRAD-FE 1.5-30 MG-MCG PO TABS: 1.0000 | ORAL_TABLET | Freq: Every day | ORAL | 4 refills | Status: DC

## 2018-08-20 MED ORDER — TOPIRAMATE 25 MG PO TABS: 25.0000 mg | ORAL_TABLET | Freq: Two times a day (BID) | ORAL | 5 refills | Status: DC

## 2018-08-20 NOTE — Progress Notes (Signed)
Subjective:   Patient ID: Ruth King, female    DOB: 24-Jan-2001, 17 y.o.   MRN: [id number] CC: Annual Exam (for insurance)  HPI: Ruth King is a 17 y.o. female   Here today with mother.  Has been enjoying current job.  Stays fairly active.  Walking 3.5 miles daily on the job per her electronic device.  Having monthly periods now that she is on OCP.  Would like to decrease periods to every 3 months if possible.  Was seen by pediatric endocrinologist a couple times for oligomenorrhea and hyperandrogenim, also acanthosis nigricans and obesity. Last visit 01/2016.  Drinking regular soda sometimes.  Trying to drink plenty of fluids and water.  Migraines: Getting headaches 2-3 times a week.  Usually sensitive to light and sound.  Likes to lie down in a dark room when she is able to.  Was given Fioricet in the emergency room several weeks ago, helped some.  Headaches usually last for 2 to 3 hours.  If she took the Fioricet, sometimes lasted for the last.  Fioricet tended to make her tired.  Does not have auras before migraines.  Relevant past medical, surgical, family and social history reviewed. Allergies and medications reviewed and updated. Social History   Tobacco Use  Smoking Status Never Smoker  Smokeless Tobacco Never Used   ROS: All systems negative other than what is in the HPI.  Objective:    BP 132/73   Pulse 94   Temp 97.6 F (36.4 C) (Oral)   Ht 6' (1.829 m)   Wt 283 lb 3.2 oz (128.5 kg)   BMI 38.41 kg/m   Wt Readings from Last 3 Encounters:  08/20/18 283 lb 3.2 oz (128.5 kg) (>99 %, Z= 2.70)*  07/16/18 272 lb 12.8 oz (123.7 kg) (>99 %, Z= 2.63)*  07/03/18 280 lb 13.9 oz (127.4 kg) (>99 %, Z= 2.67)*   * Growth percentiles are based on CDC (Girls, 2-20 Years) data.    Gen: NAD, alert, cooperative with exam, NCAT EYES: EOMI, no conjunctival injection, or no icterus ENT:  TMs pearly gray b/l, OP without erythema LYMPH: no cervical LAD CV: NRRR, normal S1/S2,  no murmur, distal pulses 2+ b/l Resp: CTABL, no wheezes, normal WOB Abd: +BS, soft, NTND. no guarding or organomegaly Ext: No edema, warm Neuro: Alert and oriented, strength equal b/l UE and LE, coordination grossly normal MSK: normal muscle bulk  Assessment & Plan:  Ruth King was seen today for annual exam.  Diagnoses and all orders for this visit:  Encounter for preventive care -     CMP14+EGFR -     Lipid panel -     TSH -     Bayer DCA Hb A1c Waived  Oligomenorrhea, unspecified type -     norethindrone-ethinyl estradiol-iron (LARIN FE 1.5/30) 1.5-30 MG-MCG tablet; Take 1 tablet by mouth daily. Skip inactive pills x 2 mo, starting new pack immediately, then take for third month  Other migraine without status migrainosus, not intractable Trial of controller medicine.  Prescribe rizatriptan to take as needed for migraines.  Do not take more than twice a week. -     topiramate (TOPAMAX) 25 MG tablet; Take 1 tablet (25 mg total) by mouth 2 (two) times daily. -     rizatriptan (MAXALT) 10 MG tablet; Take 0.5-1 tablets (5-10 mg total) by mouth as needed for migraine. May repeat in 2 hours if needed  BMI 38.0-38.9,adult Discussed lifestyle changes, increasing physical activity, decreasing sugar intake, carbohydrate  intake.  Follow up plan: Return for 2-4 week for skin tag removal. Assunta Found, MD [place]

## 2018-08-21 LAB — CMP14+EGFR
ALBUMIN: 4.1 g/dL (ref 3.5–5.5)
ALK PHOS: 91 IU/L (ref 39–117)
ALT: 21 IU/L (ref 0–32)
AST: 23 IU/L (ref 0–40)
Albumin/Globulin Ratio: 1.6 (ref 1.2–2.2)
BUN / CREAT RATIO: 18 (ref 9–23)
BUN: 13 mg/dL (ref 6–20)
CO2: 21 mmol/L (ref 20–29)
Calcium: 9.6 mg/dL (ref 8.7–10.2)
Chloride: 102 mmol/L (ref 96–106)
Creatinine, Ser: 0.71 mg/dL (ref 0.57–1.00)
GFR calc non Af Amer: 124 mL/min/{1.73_m2} (ref >59)
GFR, EST AFRICAN AMERICAN: 143 mL/min/{1.73_m2} (ref >59)
GLOBULIN, TOTAL: 2.5 g/dL (ref 1.5–4.5)
Glucose: 143 mg/dL — ABNORMAL HIGH (ref 65–99)
Potassium: 4.4 mmol/L (ref 3.5–5.2)
SODIUM: 139 mmol/L (ref 134–144)
TOTAL PROTEIN: 6.6 g/dL (ref 6.0–8.5)

## 2018-08-21 LAB — LIPID PANEL
CHOLESTEROL TOTAL: 138 mg/dL (ref 100–169)
Chol/HDL Ratio: 4.3 ratio (ref 0.0–4.4)
HDL: 32 mg/dL — AB (ref >39)
LDL CALC: 74 mg/dL (ref 0–109)
Triglycerides: 159 mg/dL — ABNORMAL HIGH (ref 0–89)
VLDL CHOLESTEROL CAL: 32 mg/dL (ref 5–40)

## 2018-08-21 LAB — TSH: TSH: 1.73 u[IU]/mL (ref 0.450–4.500)

## 2018-08-28 ENCOUNTER — Encounter: Payer: Self-pay | Admitting: *Deleted

## 2018-08-28 ENCOUNTER — Encounter: Payer: Self-pay | Admitting: Physician Assistant

## 2018-08-28 ENCOUNTER — Ambulatory Visit: Payer: PRIVATE HEALTH INSURANCE | Admitting: Physician Assistant

## 2018-08-28 VITALS — BP 126/79 | HR 104 | Temp 98.5°F | Ht 72.0 in | Wt 277.2 lb

## 2018-08-28 DIAGNOSIS — R509 Fever, unspecified: Secondary | ICD-10-CM | POA: Diagnosis not present

## 2018-08-28 DIAGNOSIS — J209 Acute bronchitis, unspecified: Secondary | ICD-10-CM

## 2018-08-28 DIAGNOSIS — J019 Acute sinusitis, unspecified: Secondary | ICD-10-CM

## 2018-08-28 LAB — VERITOR FLU A/B WAIVED
Influenza A: NEGATIVE
Influenza B: NEGATIVE

## 2018-08-28 LAB — RAPID STREP SCREEN (MED CTR MEBANE ONLY): STREP GP A AG, IA W/REFLEX: NEGATIVE

## 2018-08-28 LAB — CULTURE, GROUP A STREP

## 2018-08-28 MED ORDER — AMOXICILLIN 250 MG/5ML PO SUSR: 250.0000 mg | Freq: Three times a day (TID) | ORAL | 0 refills | Status: DC

## 2018-08-28 NOTE — Progress Notes (Signed)
BP 126/79   Pulse (!) 104   Temp 98.5 F (36.9 C) (Oral)   Ht 6' (1.829 m)   Wt 277 lb 3.2 oz (125.7 kg)   BMI 37.60 kg/m    Subjective:    Patient ID: Ruth DixonSierra M King, female    DOB: 01/29/2001, 17 y.o.   MRN: [id number]  HPI: Ruth DixonSierra M King is a 17 y.o. female presenting on 08/28/2018 for Generalized Body Aches (since Saturday, has had chills, but no known fever, no appetite); Sore Throat; Cough; and Nasal Congestion  This patient has had many days of sinus headache and postnasal drainage. There is copious drainage at times. Denies any fever at this time. There has been a history of sinus infections in the past.  No history of sinus surgery. There is cough at night. It has become more prevalent in recent days.   Past Medical History:  Diagnosis Date  . Asthma   . Nausea & vomiting    Relevant past medical, surgical, family and social history reviewed and updated as indicated. Interim medical history since our last visit reviewed. Allergies and medications reviewed and updated. DATA REVIEWED: CHART IN EPIC  Family History reviewed for pertinent findings.  Review of Systems  Constitutional: Positive for chills, fatigue and fever. Negative for activity change and appetite change.  HENT: Positive for congestion, postnasal drip, sinus pressure, sinus pain and sore throat.   Eyes: Negative.   Respiratory: Positive for cough and wheezing.   Cardiovascular: Negative.  Negative for chest pain, palpitations and leg swelling.  Gastrointestinal: Negative.   Genitourinary: Negative.   Musculoskeletal: Negative.   Skin: Negative.   Neurological: Positive for headaches.    Allergies as of 08/28/2018      Reactions   Bee Venom Shortness Of Breath   Azithromycin Other (See Comments)   Not effective   Cephalosporins Hives      Medication List        Accurate as of 08/28/18  6:54 PM. Always use your most recent med list.          albuterol 108 (90 Base) MCG/ACT  inhaler Commonly known as:  PROVENTIL HFA;VENTOLIN HFA Inhale 2 puffs into the lungs every 6 (six) hours as needed for wheezing or shortness of breath.   amoxicillin 250 MG/5ML suspension Commonly known as:  AMOXIL Take 5 mLs (250 mg total) by mouth 3 (three) times daily.   cetirizine 10 MG tablet Commonly known as:  ZYRTEC Take 1 tablet (10 mg total) by mouth at bedtime.   fluticasone 50 MCG/ACT nasal spray Commonly known as:  FLONASE Place 2 sprays into both nostrils daily.   montelukast 10 MG tablet Commonly known as:  SINGULAIR Take 1 tablet (10 mg total) by mouth at bedtime. (Needs to be seen before next refill)   norethindrone-ethinyl estradiol-iron 1.5-30 MG-MCG tablet Commonly known as:  MICROGESTIN FE,GILDESS FE,LOESTRIN FE Take 1 tablet by mouth daily. Skip inactive pills x 2 mo, starting new pack immediately, then take for third month   rizatriptan 10 MG tablet Commonly known as:  MAXALT Take 0.5-1 tablets (5-10 mg total) by mouth as needed for migraine. May repeat in 2 hours if needed   topiramate 25 MG tablet Commonly known as:  TOPAMAX Take 1 tablet (25 mg total) by mouth 2 (two) times daily.          Objective:    BP 126/79   Pulse (!) 104   Temp 98.5 F (36.9 C) (Oral)  Ht 6' (1.829 m)   Wt 277 lb 3.2 oz (125.7 kg)   BMI 37.60 kg/m   Allergies  Allergen Reactions  . Bee Venom Shortness Of Breath  . Azithromycin Other (See Comments)    Not effective   . Cephalosporins Hives    Wt Readings from Last 3 Encounters:  08/28/18 277 lb 3.2 oz (125.7 kg) (>99 %, Z= 2.66)*  08/20/18 283 lb 3.2 oz (128.5 kg) (>99 %, Z= 2.70)*  07/16/18 272 lb 12.8 oz (123.7 kg) (>99 %, Z= 2.63)*   * Growth percentiles are based on CDC (Girls, 2-20 Years) data.    Physical Exam  Constitutional: She is oriented to person, place, and time. She appears well-developed and well-nourished.  HENT:  Head: Normocephalic and atraumatic.  Right Ear: Tympanic membrane and  external ear normal. No middle ear effusion.  Left Ear: Tympanic membrane and external ear normal.  No middle ear effusion.  Nose: Mucosal edema and rhinorrhea present. Right sinus exhibits no maxillary sinus tenderness. Left sinus exhibits no maxillary sinus tenderness.  Mouth/Throat: Uvula is midline. Posterior oropharyngeal erythema present.  Eyes: Pupils are equal, round, and reactive to light. Conjunctivae and EOM are normal. Right eye exhibits no discharge. Left eye exhibits no discharge.  Neck: Normal range of motion.  Cardiovascular: Normal rate, regular rhythm and normal heart sounds.  Pulmonary/Chest: Effort normal and breath sounds normal. No respiratory distress. She has no wheezes.  Abdominal: Soft.  Lymphadenopathy:    She has no cervical adenopathy.  Neurological: She is alert and oriented to person, place, and time.  Skin: Skin is warm and dry.  Psychiatric: She has a normal mood and affect.    Results for orders placed or performed in visit on 08/28/18  Rapid Strep Screen ([facility] ONLY)  Result Value Ref Range   Strep Gp A Ag, IA W/Reflex Negative Negative  Culture, Group A Strep  Result Value Ref Range   Strep A Culture CANCELED   Veritor Flu A/B Waived  Result Value Ref Range   Influenza A Negative Negative   Influenza B Negative Negative      Assessment & Plan:   1. Fever, unspecified fever cause - Veritor Flu A/B Waived - Rapid Strep Screen ([facility] ONLY) - Culture, Group A Strep  2. Acute bronchitis, unspecified organism - amoxicillin (AMOXIL) 250 MG/5ML suspension; Take 5 mLs (250 mg total) by mouth 3 (three) times daily.  Dispense: 150 mL; Refill: 0  3. Acute non-recurrent sinusitis, unspecified location - amoxicillin (AMOXIL) 250 MG/5ML suspension; Take 5 mLs (250 mg total) by mouth 3 (three) times daily.  Dispense: 150 mL; Refill: 0   Continue all other maintenance medications as listed above.  Follow up plan: No follow-ups on  file.  Educational handout given for survey  Remus Loffler PA-C [facility] [address]  [place], [place] [address] [phone]   08/28/2018, 6:54 PM

## 2018-09-28 ENCOUNTER — Ambulatory Visit: Payer: PRIVATE HEALTH INSURANCE | Admitting: Pediatrics

## 2018-09-28 ENCOUNTER — Telehealth: Payer: Self-pay | Admitting: Pediatrics

## 2018-09-28 ENCOUNTER — Encounter: Payer: Self-pay | Admitting: Pediatrics

## 2018-09-28 VITALS — BP 120/79 | HR 98 | Temp 99.1°F | Ht 72.0 in | Wt 271.6 lb

## 2018-09-28 DIAGNOSIS — J029 Acute pharyngitis, unspecified: Secondary | ICD-10-CM | POA: Diagnosis not present

## 2018-09-28 DIAGNOSIS — E119 Type 2 diabetes mellitus without complications: Secondary | ICD-10-CM

## 2018-09-28 DIAGNOSIS — H65111 Acute and subacute allergic otitis media (mucoid) (sanguinous) (serous), right ear: Secondary | ICD-10-CM

## 2018-09-28 DIAGNOSIS — H65191 Other acute nonsuppurative otitis media, right ear: Secondary | ICD-10-CM

## 2018-09-28 LAB — RAPID STREP SCREEN (MED CTR MEBANE ONLY): Strep Gp A Ag, IA W/Reflex: NEGATIVE

## 2018-09-28 LAB — CULTURE, GROUP A STREP

## 2018-09-28 MED ORDER — AMOXICILLIN 250 MG PO CHEW: 500.0000 mg | CHEWABLE_TABLET | Freq: Two times a day (BID) | ORAL | 0 refills | Status: AC

## 2018-09-28 NOTE — Telephone Encounter (Signed)
Pt's mother is on ROI and advised of pt's test results as below and advised pt has f/u appt to come back in and see Oswaldo DoneVincent 1/24    Patient's blood sugar is elevated to the point that she has diabetes. The levels are not so high she needs to be on medicine yet or checking her blood sugars regularly, but we do need to get improve blood sugars to prevent long-term problems that happen with diabetes. This is done mostly with weight decrease and diet changes.   I want to get her into see a nutritionist, to further discuss things we can do to improve her blood sugar levels. I went ahead and put in that referral. If OK with pt/mom, can I put in referral to get her back in to see endocrinologist? Can you also schedule appointment to come back and see me in 3 months? If patient/mom have further questions, okay to schedule her in to see me sooner to discuss. May take some time to get back into see the endocrinologist.

## 2018-09-28 NOTE — Progress Notes (Addendum)
  Subjective:   Patient ID: Ruth King, female    DOB: 04/05/2001, 17 y.o.   MRN: [id number] CC: Nasal Congestion (1 week); Facial Pain; and Cough  HPI: Ruth King is a 17 y.o. female   Sick for about the last 7 days.  Woke up this morning feeling worse.  Has had some subjective fevers off and on for last few days.  Nasal congestion bothering her the most at the beginning.  Now feels achy and tired.  Some coughing.  Some ear pain both sides.  At last visit 08/2018, blood sugars on labs were elevated to the point of diabetes.  Attempts to reach patient via phone and letter in the mail apparently unsuccessful, patient says she did not know about the diagnosis.  She has been mostly drinking water.  Continues working very physically active job, walking all day long.  Avoiding snacks.  Trying to eat mostly healthy foods.  Relevant past medical, surgical, family and social history reviewed. Allergies and medications reviewed and updated. Social History   Tobacco Use  Smoking Status Never Smoker  Smokeless Tobacco Never Used   ROS: Per HPI   Objective:    BP 120/79   Pulse 98   Temp 99.1 F (37.3 C) (Oral)   Ht 6' (1.829 m)   Wt 271 lb 9.3 oz (123.2 kg)   BMI 36.83 kg/m   Wt Readings from Last 3 Encounters:  09/28/18 271 lb 9.3 oz (123.2 kg) (>99 %, Z= 2.64)*  08/28/18 277 lb 3.2 oz (125.7 kg) (>99 %, Z= 2.66)*  08/20/18 283 lb 3.2 oz (128.5 kg) (>99 %, Z= 2.70)*   * Growth percentiles are based on CDC (Girls, 2-20 Years) data.    Gen: NAD, alert, cooperative with exam, NCAT EYES: EOMI, no conjunctival injection, or no icterus ENT: Right TM red, with white effusion, left TM dull, with clear effusion, OP without erythema LYMPH: no cervical LAD CV: NRRR, normal S1/S2, no murmur, distal pulses 2+ b/l Resp: CTABL, no wheezes, normal WOB Ext: No edema, warm Neuro: Alert and oriented MSK: normal muscle bulk  Assessment & Plan:  [name] was seen today for nasal congestion,  facial pain and cough.  Diagnoses and all orders for this visit:  Sore throat Rapid strep negative. -     Rapid Strep Screen ([facility] ONLY) -     Culture, Group A Strep  Acute mucoid otitis media of right ear Treat with amoxicillin for 7 days  Diabetes mellitus without complication (HCC) Briefly discussed with patient, blood sugars are elevated to the point that she does have diabetes.  She does not need to be on medicines at this time.  She does not need to check her blood sugars at home at this time.  Has lost 12 pounds since that visit.  Has follow-up appointment with me next month, will discuss further then.  Patient gave okay for me to call her mom to further discuss diagnosis as well. D/w mom, will start metformin.  Follow up plan: Within 4 to 6 weeks as scheduled. Rex Krasarol Kaylor Simenson, MD [facility]

## 2018-09-28 NOTE — Telephone Encounter (Signed)
PT mother has called states that pt was seen earlier today and was told she was diabetic and the pt is extremely upset and mom is calling to see exactly happened in the pt apt

## 2018-09-28 NOTE — Patient Instructions (Signed)

## 2018-09-29 MED ORDER — METFORMIN HCL 500 MG PO TABS: 500.0000 mg | ORAL_TABLET | Freq: Every day | ORAL | 3 refills | Status: DC

## 2018-09-29 NOTE — Addendum Note (Signed)
Addended by: [name], Olisa Quesnel L on: 09/29/2018 11:10 AM   Modules accepted: Orders

## 2018-09-30 LAB — CULTURE, GROUP A STREP: STREP A CULTURE: NEGATIVE

## 2018-10-08 ENCOUNTER — Ambulatory Visit: Payer: PRIVATE HEALTH INSURANCE | Admitting: Pediatrics

## 2018-10-08 ENCOUNTER — Encounter: Payer: Self-pay | Admitting: Pediatrics

## 2018-10-08 VITALS — BP 131/86 | HR 94 | Temp 97.5°F | Ht 72.0 in | Wt 274.0 lb

## 2018-10-08 DIAGNOSIS — L918 Other hypertrophic disorders of the skin: Secondary | ICD-10-CM | POA: Diagnosis not present

## 2018-10-08 DIAGNOSIS — J4520 Mild intermittent asthma, uncomplicated: Secondary | ICD-10-CM | POA: Diagnosis not present

## 2018-10-08 DIAGNOSIS — E119 Type 2 diabetes mellitus without complications: Secondary | ICD-10-CM | POA: Diagnosis not present

## 2018-10-08 MED ORDER — ALBUTEROL SULFATE HFA 108 (90 BASE) MCG/ACT IN AERS: 2.0000 | INHALATION_SPRAY | Freq: Four times a day (QID) | RESPIRATORY_TRACT | 3 refills | Status: AC | PRN

## 2018-10-08 NOTE — Progress Notes (Signed)
  Subjective:   Patient ID: Ruth King, female    DOB: 01/07/2001, 18 y.o.   MRN: [id number] CC: Mole Removal  HPI: Ruth King is a 18 y.o. female   Recent diagnosis of diabetes.  Started metformin this morning.  Did okay.  Has been cutting back on milk intake.  Drinking mostly water.  Trying to cut out sugary foods.  Would like moles removed from around her neck.  They frequently get caught in things such as her collar, especially at work when she often gets hot and sweaty.  She needs a refill of her albuterol inhaler.  She rarely needs it.  Takes it for shortness of breath with exertion when needed.  Relevant past medical, surgical, family and social history reviewed. Allergies and medications reviewed and updated. Social History   Tobacco Use  Smoking Status Never Smoker  Smokeless Tobacco Never Used   ROS: Per HPI   Objective:    BP 131/86   Pulse 94   Temp (!) 97.5 F (36.4 C) (Oral)   Ht 6' (1.829 m)   Wt 274 lb (124.3 kg)   BMI 37.16 kg/m   Wt Readings from Last 3 Encounters:  10/08/18 274 lb (124.3 kg) (>99 %, Z= 2.65)*  09/28/18 271 lb 9.3 oz (123.2 kg) (>99 %, Z= 2.64)*  08/28/18 277 lb 3.2 oz (125.7 kg) (>99 %, Z= 2.66)*   * Growth percentiles are based on CDC (Girls, 2-20 Years) data.    Gen: NAD, alert, cooperative with exam, NCAT EYES: EOMI, no conjunctival injection, or no icterus ENT:  TMs pearly gray b/l, OP without erythema LYMPH: no cervical LAD CV: NRRR, normal S1/S2, no murmur, distal pulses 2+ b/l Resp: CTABL, no wheezes, normal WOB Abd: +BS, soft, NTND. no guarding or organomegaly Ext: No edema, warm Neuro: Alert and oriented, strength equal b/l UE and LE, coordination grossly normal MSK: normal muscle bulk Skin: Multiple skin tags, flesh-colored to light brown around neck ranging in size from less than 36mm to 4 mm pedunculated.  Some are slightly red.  Assessment & Plan:  Ruth King was seen today for mole removal.  Diagnoses and all  orders for this visit:  Skin tag Discussed options.  Patient wants removed.  Can do with lidocaine or without.  Patient wanted to try without first.  Mild intermittent asthma without complication -     albuterol (PROVENTIL HFA;VENTOLIN HFA) 108 (90 Base) MCG/ACT inhaler; Inhale 2 puffs into the lungs every 6 (six) hours as needed for wheezing or shortness of breath.  Diabetes mellitus without complication (HCC) Discussed diagnosis, started metformin.  Will repeat A1c next visit.  Patient prefers medical information to go through her mother rather than directly to her.  Procedure:  After risks, benefits and alternatives discussed with pt and pt decided to proceed, lesions were cleaned on both sides of her neck using Betadine.  Using scissors 4 R side of neck skin tags and two left side of neck skin tags were removed, each 1-87mm. R neck skin tag mildly irritated. No bleeding following procedure. Pt tolerated procedure well.     Follow up plan: As scheduled in 2 months Rex Kras, MD [facility]

## 2018-10-17 ENCOUNTER — Ambulatory Visit: Payer: PRIVATE HEALTH INSURANCE | Admitting: Pediatrics

## 2018-10-26 ENCOUNTER — Ambulatory Visit: Payer: PRIVATE HEALTH INSURANCE | Admitting: Pediatrics

## 2018-11-05 ENCOUNTER — Encounter: Payer: Self-pay | Admitting: Nurse Practitioner

## 2018-11-05 ENCOUNTER — Ambulatory Visit: Payer: PRIVATE HEALTH INSURANCE | Admitting: Nurse Practitioner

## 2018-11-05 VITALS — BP 130/87 | HR 91 | Temp 97.4°F | Ht 72.0 in | Wt 265.0 lb

## 2018-11-05 DIAGNOSIS — J029 Acute pharyngitis, unspecified: Secondary | ICD-10-CM | POA: Diagnosis not present

## 2018-11-05 DIAGNOSIS — J020 Streptococcal pharyngitis: Secondary | ICD-10-CM | POA: Diagnosis not present

## 2018-11-05 MED ORDER — AMOXICILLIN 875 MG PO TABS: 875.0000 mg | ORAL_TABLET | Freq: Two times a day (BID) | ORAL | 0 refills | Status: DC

## 2018-11-05 NOTE — Patient Instructions (Signed)

## 2018-11-05 NOTE — Progress Notes (Signed)
   Subjective:    Patient ID: Ruth King, female    DOB: 23-Aug-2001, 18 y.o.   MRN: [id number]   Chief Complaint: Sore Throat and Nasal Congestion   HPI Patient come sin today c/o sore throat and nasal congestion. Started about 3 days ago. Has been taking alkasletzer which has not helped. Sore throat has gotten worse and she has voice changes.    Review of Systems  Constitutional: Positive for chills and fever (?).  HENT: Positive for congestion, sore throat, trouble swallowing and voice change.   Respiratory: Positive for cough (productive- greenish).   Gastrointestinal: Negative.   Musculoskeletal: Negative.   Neurological: Negative for headaches.  Psychiatric/Behavioral: Negative.   All other systems reviewed and are negative.      Objective:   Physical Exam Vitals signs and nursing note reviewed.  Constitutional:      General: She is in acute distress (mild).     Appearance: She is well-developed. She is obese.  HENT:     Right Ear: Hearing, tympanic membrane, ear canal and external ear normal.     Left Ear: Hearing, tympanic membrane, ear canal and external ear normal.     Nose: Mucosal edema, congestion and rhinorrhea present.     Right Turbinates: Swollen.     Left Turbinates: Swollen.     Right Sinus: No maxillary sinus tenderness or frontal sinus tenderness.     Left Sinus: No maxillary sinus tenderness or frontal sinus tenderness.     Mouth/Throat:     Lips: Pink.     Pharynx: Posterior oropharyngeal erythema and uvula swelling present.     Tonsils: No tonsillar exudate or tonsillar abscesses. Swelling: 0 on the right. 0 on the left.  Neck:     Musculoskeletal: Normal range of motion.  Cardiovascular:     Rate and Rhythm: Normal rate and regular rhythm.     Heart sounds: Normal heart sounds.  Pulmonary:     Effort: Pulmonary effort is normal.     Breath sounds: Normal breath sounds.  Lymphadenopathy:     Cervical: No cervical adenopathy.  Skin:  General: Skin is warm and dry.  Neurological:     General: No focal deficit present.     Mental Status: She is alert and oriented to person, place, and time.  Psychiatric:        Mood and Affect: Mood normal.        Behavior: Behavior normal.    BP 130/87   Pulse 91   Temp (!) 97.4 F (36.3 C) (Oral)   Ht 6' (1.829 m)   Wt 265 lb (120.2 kg)   BMI 35.94 kg/m   STREP POSITIVE        Assessment & Plan:  Ruth King in today with chief complaint of Sore Throat and Nasal Congestion   1. Sore throat - Rapid Strep Screen ([facility] ONLY)  2. Strep pharyngitis Force fluids Motrin or tylenol OTC OTC decongestant Throat lozenges if help New toothbrush in 3 days  - amoxicillin (AMOXIL) 875 MG tablet; Take 1 tablet (875 mg total) by mouth 2 (two) times daily. 1 po BID  Dispense: 20 tablet; Refill: 0   [name], FNP

## 2018-11-06 LAB — RAPID STREP SCREEN (MED CTR MEBANE ONLY): Strep Gp A Ag, IA W/Reflex: POSITIVE — AB

## 2018-11-13 ENCOUNTER — Other Ambulatory Visit: Payer: Self-pay | Admitting: Pediatrics

## 2018-11-13 DIAGNOSIS — G43809 Other migraine, not intractable, without status migrainosus: Secondary | ICD-10-CM

## 2018-11-16 ENCOUNTER — Ambulatory Visit: Payer: PRIVATE HEALTH INSURANCE | Admitting: Family Medicine

## 2018-11-16 ENCOUNTER — Encounter: Payer: Self-pay | Admitting: Family Medicine

## 2018-11-16 VITALS — BP 132/71 | HR 79 | Temp 97.0°F | Ht 72.0 in | Wt 264.6 lb

## 2018-11-16 DIAGNOSIS — E119 Type 2 diabetes mellitus without complications: Secondary | ICD-10-CM | POA: Insufficient documentation

## 2018-11-16 DIAGNOSIS — E1169 Type 2 diabetes mellitus with other specified complication: Secondary | ICD-10-CM | POA: Insufficient documentation

## 2018-11-16 LAB — BAYER DCA HB A1C WAIVED: HB A1C: 5.7 % (ref <7.0)

## 2018-11-16 NOTE — Progress Notes (Signed)
BP 132/71   Pulse 79   Temp (!) 97 F (36.1 C) (Oral)   Ht 6' (1.829 m)   Wt 264 lb 9.6 oz (120 kg)   BMI 35.89 kg/m    Subjective:    Patient ID: Ruth King, female    DOB: 2001-03-02, 18 y.o.   MRN: [id number]  HPI: Ruth King is a 18 y.o. female presenting on 11/16/2018 for Diabetes (4 week re check )   HPI Type 2 diabetes mellitus,  her A1c was 6.6 which is borderline at that time, still some concern about whether is type II or type I, do not see where the testing has been done to discern between the 2 but we will see where her A1c is this time based on the metformin and diet control and if it is worse then we will need to do the full testing for possible type 1 diabetes. Patient comes in today for recheck of his diabetes. Patient has been currently taking metformin. Patient is not currently on an ACE inhibitor/ARB. Patient has not seen an ophthalmologist this year. Patient denies any issues with their feet.   Relevant past medical, surgical, family and social history reviewed and updated as indicated. Interim medical history since our last visit reviewed. Allergies and medications reviewed and updated.  Review of Systems  Constitutional: Negative for chills and fever.  Eyes: Negative for visual disturbance.  Respiratory: Negative for chest tightness and shortness of breath.   Cardiovascular: Negative for chest pain and leg swelling.  Musculoskeletal: Negative for back pain and gait problem.  Skin: Negative for rash.  Neurological: Negative for light-headedness and headaches.  Psychiatric/Behavioral: Negative for agitation and behavioral problems.  All other systems reviewed and are negative.   Per HPI unless specifically indicated above        Objective:    BP 132/71   Pulse 79   Temp (!) 97 F (36.1 C) (Oral)   Ht 6' (1.829 m)   Wt 264 lb 9.6 oz (120 kg)   BMI 35.89 kg/m   Wt Readings from Last 3 Encounters:  11/16/18 264 lb 9.6 oz (120 kg) (>99  %, Z= 2.60)*  11/05/18 265 lb (120.2 kg) (>99 %, Z= 2.60)*  10/08/18 274 lb (124.3 kg) (>99 %, Z= 2.65)*   * Growth percentiles are based on CDC (Girls, 2-20 Years) data.    Physical Exam Vitals signs and nursing note reviewed.  Constitutional:      General: She is not in acute distress.    Appearance: She is well-developed. She is not diaphoretic.  Eyes:     Conjunctiva/sclera: Conjunctivae normal.  Cardiovascular:     Rate and Rhythm: Normal rate and regular rhythm.     Heart sounds: Normal heart sounds. No murmur.  Pulmonary:     Effort: Pulmonary effort is normal. No respiratory distress.     Breath sounds: Normal breath sounds. No wheezing.  Musculoskeletal: Normal range of motion.        General: No tenderness.  Skin:    General: Skin is warm and dry.     Findings: No rash.  Neurological:     Mental Status: She is alert and oriented to person, place, and time.     Coordination: Coordination normal.  Psychiatric:        Behavior: Behavior normal.     Patient's last A1c was 6.6, will recheck today    Assessment & Plan:   Problem List Items Addressed This Visit  Endocrine   Diabetes mellitus without complication (HCC) - Primary   Relevant Orders   Bayer DCA Hb A1c Waived      Will check A1c and if it is worsened then we will need to do full testing for possible type 1 diabetes Follow up plan: Return in about 3 months (around 02/14/2019), or if symptoms worsen or fail to improve, for Diabetes recheck.  Counseling provided for all of the vaccine components Orders Placed This Encounter  Procedures  . Bayer [facility] Hb A1c Waived    Arville Care, MD [facility] 11/16/2018, 8:46 AM

## 2018-12-17 ENCOUNTER — Other Ambulatory Visit: Payer: Self-pay | Admitting: Family Medicine

## 2018-12-17 DIAGNOSIS — G43809 Other migraine, not intractable, without status migrainosus: Secondary | ICD-10-CM

## 2018-12-27 ENCOUNTER — Ambulatory Visit (INDEPENDENT_AMBULATORY_CARE_PROVIDER_SITE_OTHER): Payer: PRIVATE HEALTH INSURANCE | Admitting: Family Medicine

## 2018-12-27 ENCOUNTER — Other Ambulatory Visit: Payer: Self-pay

## 2018-12-27 ENCOUNTER — Encounter: Payer: Self-pay | Admitting: Family Medicine

## 2018-12-27 DIAGNOSIS — G43809 Other migraine, not intractable, without status migrainosus: Secondary | ICD-10-CM | POA: Diagnosis not present

## 2018-12-27 MED ORDER — RIZATRIPTAN BENZOATE 10 MG PO TABS: ORAL_TABLET | ORAL | 0 refills | Status: DC

## 2018-12-27 NOTE — Progress Notes (Signed)
Virtual Visit via telephone Note  I connected with Ruth King on 12/27/18 at 1413 by telephone and verified that I am speaking with the correct person using two identifiers. Ruth King is currently located at home and no other people are currently with her during visit. The provider, Elige Radon Naftali Carchi, MD is located in their office at time of visit.  Call ended at 1423  I discussed the limitations, risks, security and privacy concerns of performing an evaluation and management service by telephone and the availability of in person appointments. I also discussed with the patient that there may be a patient responsible charge related to this service. The patient expressed understanding and agreed to proceed.   History and Present Illness:   1. Other migraine without status migrainosus, not intractable   bad migraine for 2 days, has taken 2 Maxalt and had to leave work from [employer].  Headache is doing better since Maxalt and ice pack and rest. Denies visual changes or cp.  Headache is in the front on both sides. She has had them like this before.  No cough or congestion or fevers or sore throat.  She has normal allergies but nothing worse.  Outpatient Encounter Medications as of 12/27/2018  Medication Sig  . albuterol (PROVENTIL HFA;VENTOLIN HFA) 108 (90 Base) MCG/ACT inhaler Inhale 2 puffs into the lungs every 6 (six) hours as needed for wheezing or shortness of breath.  . cetirizine (ZYRTEC) 10 MG tablet Take 1 tablet (10 mg total) by mouth at bedtime.  . fluticasone (FLONASE) 50 MCG/ACT nasal spray Place 2 sprays into both nostrils daily. (Patient taking differently: Place 2 sprays into both nostrils daily as needed for allergies or rhinitis. )  . metFORMIN (GLUCOPHAGE) 500 MG tablet Take 1 tablet (500 mg total) by mouth daily with breakfast.  . montelukast (SINGULAIR) 10 MG tablet Take 1 tablet (10 mg total) by mouth at bedtime. (Needs to be seen before next refill)  .  norethindrone-ethinyl estradiol-iron (LARIN FE 1.5/30) 1.5-30 MG-MCG tablet Take 1 tablet by mouth daily. Skip inactive pills x 2 mo, starting new pack immediately, then take for third month  . rizatriptan (MAXALT) 10 MG tablet TAKE ONE-HALF TO ONE TABLET BY MOUTH AS NEEDED FOR MIGRAINE. MAY REPEAT IN TWO HOURS IF NEEDED  . topiramate (TOPAMAX) 25 MG tablet Take 1 tablet (25 mg total) by mouth 2 (two) times daily.   No facility-administered encounter medications on file as of 12/27/2018.     Review of Systems  Constitutional: Negative for chills and fever.  HENT: Positive for rhinorrhea (Patient says she is had allergies) and sneezing. Negative for congestion, sinus pressure, sinus pain and sore throat.   Eyes: Positive for photophobia. Negative for redness and visual disturbance.  Respiratory: Negative for cough, chest tightness and shortness of breath.   Cardiovascular: Negative for chest pain and leg swelling.  Genitourinary: Negative for difficulty urinating and dysuria.  Musculoskeletal: Negative for back pain and gait problem.  Skin: Negative for rash.  Neurological: Positive for headaches. Negative for dizziness and light-headedness.  Psychiatric/Behavioral: Negative for agitation and behavioral problems.  All other systems reviewed and are negative.   Observations/Objective: Patient sounds comfortable and not in distress on the phone.  Assessment and Plan: Problem List Items Addressed This Visit    None    Visit Diagnoses    Other migraine without status migrainosus, not intractable    -  Primary   Relevant Medications   rizatriptan (MAXALT)  10 MG tablet       Follow Up Instructions: Patient sounds like she is having 1 of her typical migraines, she has seasonal allergies but those have been going on for some time with the weather changes and not running.  Patient mainly needs a note for work because of migraine that she missed and they will let her go back until she has  been cleared   I discussed the assessment and treatment plan with the patient. The patient was provided an opportunity to ask questions and all were answered. The patient agreed with the plan and demonstrated an understanding of the instructions.   The patient was advised to call back or seek an in-person evaluation if the symptoms worsen or if the condition fails to improve as anticipated.  The above assessment and management plan was discussed with the patient. The patient verbalized understanding of and has agreed to the management plan. Patient is aware to call the clinic if symptoms persist or worsen. Patient is aware when to return to the clinic for a follow-up visit. Patient educated on when it is appropriate to go to the emergency department.    I provided 10 minutes of non-face-to-face time during this encounter.    Nils Pyle, MD

## 2019-01-18 ENCOUNTER — Other Ambulatory Visit: Payer: Self-pay | Admitting: Pediatrics

## 2019-01-18 DIAGNOSIS — G43809 Other migraine, not intractable, without status migrainosus: Secondary | ICD-10-CM

## 2019-01-24 ENCOUNTER — Other Ambulatory Visit: Payer: Self-pay

## 2019-01-24 ENCOUNTER — Ambulatory Visit (INDEPENDENT_AMBULATORY_CARE_PROVIDER_SITE_OTHER): Payer: PRIVATE HEALTH INSURANCE | Admitting: Family Medicine

## 2019-01-24 ENCOUNTER — Encounter: Payer: Self-pay | Admitting: Family Medicine

## 2019-01-24 DIAGNOSIS — G43019 Migraine without aura, intractable, without status migrainosus: Secondary | ICD-10-CM | POA: Diagnosis not present

## 2019-01-24 DIAGNOSIS — G43909 Migraine, unspecified, not intractable, without status migrainosus: Secondary | ICD-10-CM | POA: Insufficient documentation

## 2019-01-24 MED ORDER — UBROGEPANT 50 MG PO TABS: 50.0000 mg | ORAL_TABLET | ORAL | 0 refills | Status: DC

## 2019-01-24 NOTE — Progress Notes (Signed)
Virtual Visit via telephone Note Due to COVID-19, visit is conducted virtually and was requested by patient.   I connected with Ruth King on 01/24/19 at 0845 by telephone and verified that I am speaking with the correct person using two identifiers. Ruth King is currently located at home and family is currently with them during visit. The provider, Kari Baars, FNP is located in their office at time of visit.  I discussed the limitations, risks, security and privacy concerns of performing an evaluation and management service by telephone and the availability of in person appointments. I also discussed with the patient that there may be a patient responsible charge related to this service. The patient expressed understanding and agreed to proceed.  Subjective:  Patient ID: Ruth King, female    DOB: November 26, 2000, 18 y.o.   MRN: [id number]  Chief Complaint:  Headache   HPI: Ruth King is a 18 y.o. female presenting on 01/24/2019 for Headache   Pt reports an ongoing migraine headache. States this started yesterday and has not subsided. States the pain is throbbing and located in the back of her head. She denies phonophobia, photophobia, nausea, vomiting, or visual disturbances. She denies an aura. States the pain is 10/10 at worst. She takes her Topamax as a preventative and Maxalt as an abortive. Pt states this is not helping this time. States she took the Maxalt at least 3 times yesterday without relief of her symptoms. She has also tried Excedrin and Aleve without relief of symptoms. She has had to miss work due to the headache.    Relevant past medical, surgical, family, and social history reviewed and updated as indicated.  Allergies and medications reviewed and updated.   Past Medical History:  Diagnosis Date  . Asthma   . Nausea & vomiting     Past Surgical History:  Procedure Laterality Date  . ADENOIDECTOMY    . APPENDECTOMY    . tubes in ears       Social History   Socioeconomic History  . Marital status: Single    Spouse name: Not on file  . Number of children: Not on file  . Years of education: Not on file  . Highest education level: Not on file  Occupational History  . Not on file  Social Needs  . Financial resource strain: Not on file  . Food insecurity:    Worry: Not on file    Inability: Not on file  . Transportation needs:    Medical: Not on file    Non-medical: Not on file  Tobacco Use  . Smoking status: Never Smoker  . Smokeless tobacco: Never Used  Substance and Sexual Activity  . Alcohol use: No  . Drug use: No  . Sexual activity: Not on file  Lifestyle  . Physical activity:    Days per week: Not on file    Minutes per session: Not on file  . Stress: Not on file  Relationships  . Social connections:    Talks on phone: Not on file    Gets together: Not on file    Attends religious service: Not on file    Active member of club or organization: Not on file    Attends meetings of clubs or organizations: Not on file    Relationship status: Not on file  . Intimate partner violence:    Fear of current or ex partner: Not on file    Emotionally abused: Not on file  Physically abused: Not on file    Forced sexual activity: Not on file  Other Topics Concern  . Not on file  Social History Narrative   Lives at home with mom, dad and two sisters, attends [employer] is in the 10th grade.     Outpatient Encounter Medications as of 01/24/2019  Medication Sig  . albuterol (PROVENTIL HFA;VENTOLIN HFA) 108 (90 Base) MCG/ACT inhaler Inhale 2 puffs into the lungs every 6 (six) hours as needed for wheezing or shortness of breath.  . cetirizine (ZYRTEC) 10 MG tablet Take 1 tablet (10 mg total) by mouth at bedtime.  . fluticasone (FLONASE) 50 MCG/ACT nasal spray Place 2 sprays into both nostrils daily. (Patient taking differently: Place 2 sprays into both nostrils daily as needed for allergies or rhinitis. )  .  metFORMIN (GLUCOPHAGE) 500 MG tablet Take 1 tablet (500 mg total) by mouth daily with breakfast.  . montelukast (SINGULAIR) 10 MG tablet Take 1 tablet (10 mg total) by mouth at bedtime. (Needs to be seen before next refill)  . norethindrone-ethinyl estradiol-iron (LARIN FE 1.5/30) 1.5-30 MG-MCG tablet Take 1 tablet by mouth daily. Skip inactive pills x 2 mo, starting new pack immediately, then take for third month  . rizatriptan (MAXALT) 10 MG tablet TAKE ONE-HALF TO ONE TABLET BY MOUTH AS NEEDED FOR MIGRAINE. MAY REPEAT IN TWO HOURS IF NEEDED  . topiramate (TOPAMAX) 25 MG tablet TAKE 1 TABLET BY MOUTH TWICE DAILY  . Ubrogepant (UBRELVY) 50 MG TABS Take 50 mg by mouth as directed. Take at onset of headache, may repeat after 2 hours if headache still present. Max 2 tabs per day.   No facility-administered encounter medications on file as of 01/24/2019.     Allergies  Allergen Reactions  . Bee Venom Shortness Of Breath  . Azithromycin Other (See Comments)    Not effective   . Cephalosporins Hives    Review of Systems  Constitutional: Negative for chills, fatigue and fever.  HENT: Negative for congestion, sinus pressure and sinus pain.   Eyes: Negative for photophobia and visual disturbance.  Respiratory: Negative for cough and shortness of breath.   Gastrointestinal: Negative for nausea and vomiting.  Musculoskeletal: Negative for neck pain and neck stiffness.  Neurological: Positive for headaches. Negative for dizziness, syncope, weakness, light-headedness and numbness.  Psychiatric/Behavioral: Negative for confusion.  All other systems reviewed and are negative.        Observations/Objective: No vital signs or physical exam, this was a telephone or virtual health encounter.  Pt alert and oriented, answers all questions appropriately, and able to speak in full sentences.    Assessment and Plan: Ruth King was seen today for headache.  Diagnoses and all orders for this visit:   Intractable migraine without aura and without status migrainosus Due to failed preventative therapy with Topamax and abortive therapy with Maxalt, will trial abortive therapy with Ubrelvy. Pt aware of proper dosing. Report any new or worsening symptoms. Medications as prescribed. Follow up in 2 weeks to determine if new medications are beneficial.  -     Ubrogepant (UBRELVY) 50 MG TABS; Take 50 mg by mouth as directed. Take at onset of headache, may repeat after 2 hours if headache still present. Max 2 tabs per day.     Follow Up Instructions: Return in about 2 weeks (around 02/07/2019), or if symptoms worsen or fail to improve, for Migraine.    I discussed the assessment and treatment plan with the patient. The patient was provided  an opportunity to ask questions and all were answered. The patient agreed with the plan and demonstrated an understanding of the instructions.   The patient was advised to call back or seek an in-person evaluation if the symptoms worsen or if the condition fails to improve as anticipated.  The above assessment and management plan was discussed with the patient. The patient verbalized understanding of and has agreed to the management plan. Patient is aware to call the clinic if symptoms persist or worsen. Patient is aware when to return to the clinic for a follow-up visit. Patient educated on when it is appropriate to go to the emergency department.    I provided 15 minutes of non-face-to-face time during this encounter. The call started at 0845. The call ended at 0900.   Kari Baars, FNP-C [facility] [address] [place], [place] [address] [phone]

## 2019-02-01 ENCOUNTER — Ambulatory Visit: Payer: PRIVATE HEALTH INSURANCE | Admitting: Family Medicine

## 2019-02-01 ENCOUNTER — Encounter: Payer: Self-pay | Admitting: Family Medicine

## 2019-02-01 ENCOUNTER — Other Ambulatory Visit: Payer: Self-pay

## 2019-02-01 ENCOUNTER — Ambulatory Visit (INDEPENDENT_AMBULATORY_CARE_PROVIDER_SITE_OTHER): Payer: PRIVATE HEALTH INSURANCE

## 2019-02-01 VITALS — BP 138/88 | HR 70 | Temp 97.9°F | Ht 72.0 in | Wt 247.0 lb

## 2019-02-01 DIAGNOSIS — R52 Pain, unspecified: Secondary | ICD-10-CM

## 2019-02-01 DIAGNOSIS — M25531 Pain in right wrist: Secondary | ICD-10-CM | POA: Diagnosis not present

## 2019-02-01 MED ORDER — NAPROXEN 500 MG PO TABS: 500.0000 mg | ORAL_TABLET | Freq: Two times a day (BID) | ORAL | 0 refills | Status: AC

## 2019-02-01 NOTE — Patient Instructions (Signed)
Wrist Pain, Adult  There are many things that can cause wrist pain. Some common causes include:   An injury to the wrist area.   Overuse of the joint.   A condition that causes too much pressure to be put on a nerve in the wrist (carpal tunnel syndrome).   Wear and tear of the joints that happens as a person gets older (osteoarthritis).   Other types of arthritis.  Sometimes, the cause of wrist pain is not known. Often, the pain goes away when you follow your doctor's instructions for helping pain at home, such as resting or icing your wrist. If your wrist pain does not go away, it is important to tell your doctor.  Follow these instructions at home:   Rest the wrist area for 48 hours or more, or as long as told by your doctor.   If a splint or elastic bandage has been put on your wrist, use it as told by your doctor.  ? Take off the splint or bandage only as told by your doctor.  ? Loosen the splint or bandage if your fingers tingle, lose feeling (get numb), or turn cold or blue.   If directed, apply ice to the injured area:  ? If you have a removable splint or elastic bandage, remove it as told by your doctor.  ? Put ice in a plastic bag.  ? Place a towel between your skin and the bag or between your splint or bandage and the bag.  ? Leave the ice on for 20 minutes, 2-3 times a day.     Keep your arm raised (elevated) above the level of your heart while you are sitting or lying down.   Take over-the-counter and prescription medicines only as told by your doctor.   Keep all follow-up visits as told by your doctor. This is important.  Contact a doctor if:   You have a sudden sharp pain in the wrist, hand, or arm that is different or new.   The swelling or bruising on your wrist or hand gets worse.   Your skin becomes red, gets a rash, or has open sores.   Your pain does not get better or it gets worse.  Get help right away if:   You lose feeling in your fingers or hand.   Your fingers turn white,  very red, or cold and blue.   You cannot move your fingers.   You have a fever or chills.  This information is not intended to replace advice given to you by your health care provider. Make sure you discuss any questions you have with your health care provider.  Document Released: 03/07/2008 Document Revised: 04/14/2016 Document Reviewed: 04/07/2016  Elsevier Interactive Patient Education  2019 Elsevier Inc[employer]

## 2019-02-01 NOTE — Progress Notes (Signed)
Subjective:  Patient ID: Ruth King, female    DOB: May 05, 2001, 18 y.o.   MRN: [id number]  Chief Complaint:  Wrist Pain (RIGHT)   HPI: Ruth King is a 18 y.o. female presenting on 02/01/2019 for Wrist Pain (RIGHT)   Wrist Pain   The pain is present in the right wrist. This is a new problem. The current episode started in the past 7 days. There has been a history of trauma. The problem occurs daily. The problem has been waxing and waning. The quality of the pain is described as aching and sharp. The pain is at a severity of 7/10. The pain is moderate. Pertinent negatives include no fever, inability to bear weight, itching, joint locking, joint swelling, limited range of motion, numbness, stiffness or tingling. The symptoms are aggravated by activity. She has tried NSAIDS for the symptoms. The treatment provided significant relief. Family history does not include gout or rheumatoid arthritis. There is no history of diabetes, gout, osteoarthritis or rheumatoid arthritis.     Relevant past medical, surgical, family, and social history reviewed and updated as indicated.  Allergies and medications reviewed and updated.   Past Medical History:  Diagnosis Date  . Asthma   . Nausea & vomiting     Past Surgical History:  Procedure Laterality Date  . ADENOIDECTOMY    . APPENDECTOMY    . tubes in ears      Social History   Socioeconomic History  . Marital status: Single    Spouse name: Not on file  . Number of children: Not on file  . Years of education: Not on file  . Highest education level: Not on file  Occupational History  . Not on file  Social Needs  . Financial resource strain: Not on file  . Food insecurity:    Worry: Not on file    Inability: Not on file  . Transportation needs:    Medical: Not on file    Non-medical: Not on file  Tobacco Use  . Smoking status: Never Smoker  . Smokeless tobacco: Never Used  Substance and Sexual Activity  . Alcohol use:  No  . Drug use: No  . Sexual activity: Not on file  Lifestyle  . Physical activity:    Days per week: Not on file    Minutes per session: Not on file  . Stress: Not on file  Relationships  . Social connections:    Talks on phone: Not on file    Gets together: Not on file    Attends religious service: Not on file    Active member of club or organization: Not on file    Attends meetings of clubs or organizations: Not on file    Relationship status: Not on file  . Intimate partner violence:    Fear of current or ex partner: Not on file    Emotionally abused: Not on file    Physically abused: Not on file    Forced sexual activity: Not on file  Other Topics Concern  . Not on file  Social History Narrative   Lives at home with mom, dad and two sisters, attends [employer] is in the 10th grade.     Outpatient Encounter Medications as of 02/01/2019  Medication Sig  . albuterol (PROVENTIL HFA;VENTOLIN HFA) 108 (90 Base) MCG/ACT inhaler Inhale 2 puffs into the lungs every 6 (six) hours as needed for wheezing or shortness of breath.  . cetirizine (ZYRTEC) 10 MG  tablet Take 1 tablet (10 mg total) by mouth at bedtime.  . fluticasone (FLONASE) 50 MCG/ACT nasal spray Place 2 sprays into both nostrils daily. (Patient taking differently: Place 2 sprays into both nostrils daily as needed for allergies or rhinitis. )  . metFORMIN (GLUCOPHAGE) 500 MG tablet Take 1 tablet (500 mg total) by mouth daily with breakfast.  . montelukast (SINGULAIR) 10 MG tablet Take 1 tablet (10 mg total) by mouth at bedtime. (Needs to be seen before next refill)  . norethindrone-ethinyl estradiol-iron (LARIN FE 1.5/30) 1.5-30 MG-MCG tablet Take 1 tablet by mouth daily. Skip inactive pills x 2 mo, starting new pack immediately, then take for third month  . rizatriptan (MAXALT) 10 MG tablet TAKE ONE-HALF TO ONE TABLET BY MOUTH AS NEEDED FOR MIGRAINE. MAY REPEAT IN TWO HOURS IF NEEDED  . topiramate (TOPAMAX) 25 MG tablet  TAKE 1 TABLET BY MOUTH TWICE DAILY  . Ubrogepant (UBRELVY) 50 MG TABS Take 50 mg by mouth as directed. Take at onset of headache, may repeat after 2 hours if headache still present. Max 2 tabs per day.  . naproxen (NAPROSYN) 500 MG tablet Take 1 tablet (500 mg total) by mouth 2 (two) times daily with a meal for 14 days.   No facility-administered encounter medications on file as of 02/01/2019.     Allergies  Allergen Reactions  . Bee Venom Shortness Of Breath  . Azithromycin Other (See Comments)    Not effective   . Cephalosporins Hives    Review of Systems  Constitutional: Negative for chills, fatigue and fever.  Respiratory: Negative for cough and shortness of breath.   Cardiovascular: Negative for chest pain, palpitations and leg swelling.  Musculoskeletal: Positive for arthralgias (right wrist). Negative for back pain, gait problem, gout, joint swelling, myalgias, neck pain, neck stiffness and stiffness.  Skin: Negative for itching.  Neurological: Negative for tingling, weakness and numbness.  Psychiatric/Behavioral: Negative for confusion.  All other systems reviewed and are negative.       Objective:  BP 138/88   Pulse 70   Temp 97.9 F (36.6 C) (Oral)   Ht 6' (1.829 m)   Wt 247 lb (112 kg)   BMI 33.50 kg/m    Wt Readings from Last 3 Encounters:  02/01/19 247 lb (112 kg) (>99 %, Z= 2.47)*  11/16/18 264 lb 9.6 oz (120 kg) (>99 %, Z= 2.60)*  11/05/18 265 lb (120.2 kg) (>99 %, Z= 2.60)*   * Growth percentiles are based on CDC (Girls, 2-20 Years) data.    Physical Exam Vitals signs and nursing note reviewed.  Constitutional:      General: She is not in acute distress.    Appearance: Normal appearance. She is obese. She is not ill-appearing or toxic-appearing.  HENT:     Head: Normocephalic and atraumatic.     Mouth/Throat:     Mouth: Mucous membranes are moist.     Pharynx: Oropharynx is clear.  Eyes:     Conjunctiva/sclera: Conjunctivae normal.     Pupils:  Pupils are equal, round, and reactive to light.  Neck:     Musculoskeletal: Normal range of motion and neck supple.  Cardiovascular:     Rate and Rhythm: Normal rate and regular rhythm.     Pulses: Normal pulses.     Heart sounds: Normal heart sounds. No murmur. No friction rub. No gallop.   Pulmonary:     Effort: Pulmonary effort is normal. No respiratory distress.     Breath sounds:  Normal breath sounds.  Musculoskeletal:     Right elbow: Normal.    Right wrist: She exhibits tenderness. She exhibits normal range of motion, no bony tenderness, no swelling, no effusion, no crepitus, no deformity and no laceration.     Right forearm: Normal.     Right hand: Normal.  Skin:    General: Skin is warm and dry.     Capillary Refill: Capillary refill takes less than 2 seconds.  Neurological:     General: No focal deficit present.     Mental Status: She is alert and oriented to person, place, and time.  Psychiatric:        Mood and Affect: Mood normal.        Behavior: Behavior normal. Behavior is cooperative.        Thought Content: Thought content normal.        Judgment: Judgment normal.     Results for orders placed or performed in visit on 11/16/18  Bayer DCA Hb A1c Waived  Result Value Ref Range   HB A1C (BAYER DCA - WAIVED) 5.7 <7.0 %     X-Ray: right wrist: No acute findings. Preliminary x-ray reading by Kari BaarsMichelle Jaiceon Collister, FNP-C, [facility].   Pertinent labs & imaging results that were available during my care of the patient were reviewed by me and considered in my medical decision making.  Assessment & Plan:  [name] was seen today for wrist pain.  Diagnoses and all orders for this visit:  Right wrist pain Right wrist xray negative. Symptomatic care discussed. Velcro splint applied in office. Medications as prescribed. RICE discussed. Report any new or worsening symptoms.  -     DG Wrist Complete Right; Future -     naproxen (NAPROSYN) 500 MG tablet; Take 1 tablet (500 mg total)  by mouth 2 (two) times daily with a meal for 14 days.     Continue all other maintenance medications.  Follow up plan: Return in about 6 weeks (around 03/15/2019), or if symptoms worsen or fail to improve, for wrist pain.  Educational handout given for wrist pain  The above assessment and management plan was discussed with the patient. The patient verbalized understanding of and has agreed to the management plan. Patient is aware to call the clinic if symptoms persist or worsen. Patient is aware when to return to the clinic for a follow-up visit. Patient educated on when it is appropriate to go to the emergency department.   Kari BaarsMichelle Meigan Pates, FNP-C [facility] [phone]

## 2019-02-14 ENCOUNTER — Encounter: Payer: Self-pay | Admitting: Family Medicine

## 2019-02-14 ENCOUNTER — Ambulatory Visit (INDEPENDENT_AMBULATORY_CARE_PROVIDER_SITE_OTHER): Payer: PRIVATE HEALTH INSURANCE | Admitting: Family Medicine

## 2019-02-14 ENCOUNTER — Other Ambulatory Visit: Payer: Self-pay

## 2019-02-14 DIAGNOSIS — G43019 Migraine without aura, intractable, without status migrainosus: Secondary | ICD-10-CM | POA: Diagnosis not present

## 2019-02-14 DIAGNOSIS — E119 Type 2 diabetes mellitus without complications: Secondary | ICD-10-CM | POA: Diagnosis not present

## 2019-02-14 NOTE — Progress Notes (Signed)
Virtual Visit via telephone Note  I connected with Ruth DixonSierra M King on 02/14/19 at 0808 by telephone and verified that I am speaking with the correct person using two identifiers. Ruth King is currently located at home and no other people are currently with her during visit. The provider, Elige RadonJoshua A Dettinger, MD is located in their office at time of visit.  Call ended at 0818  I discussed the limitations, risks, security and privacy concerns of performing an evaluation and management service by telephone and the availability of in person appointments. I also discussed with the patient that there may be a patient responsible charge related to this service. The patient expressed understanding and agreed to proceed.   History and Present Illness: Headaches/ migraines Patient was given Bernita Raisinubrelvy and had migraine and it did ease off after ubrelvy it did because less severe. She has a history of migraines for some time.  She has migraine triggers and does get them more than once pre month. It does make her sleepy  Type 2 diabetes mellitus Patient comes in today for recheck of his diabetes. Patient has been currently taking metformin, diet has been doing well. Patient is not currently on an ACE inhibitor/ARB. Patient has not seen an ophthalmologist this year. Patient denies any issues with their feet.  Last a1c was 5.7  No diagnosis found.  Outpatient Encounter Medications as of 02/14/2019  Medication Sig  . albuterol (PROVENTIL HFA;VENTOLIN HFA) 108 (90 Base) MCG/ACT inhaler Inhale 2 puffs into the lungs every 6 (six) hours as needed for wheezing or shortness of breath.  . cetirizine (ZYRTEC) 10 MG tablet Take 1 tablet (10 mg total) by mouth at bedtime.  . fluticasone (FLONASE) 50 MCG/ACT nasal spray Place 2 sprays into both nostrils daily. (Patient taking differently: Place 2 sprays into both nostrils daily as needed for allergies or rhinitis. )  . metFORMIN (GLUCOPHAGE) 500 MG tablet Take 1  tablet (500 mg total) by mouth daily with breakfast.  . montelukast (SINGULAIR) 10 MG tablet Take 1 tablet (10 mg total) by mouth at bedtime. (Needs to be seen before next refill)  . naproxen (NAPROSYN) 500 MG tablet Take 1 tablet (500 mg total) by mouth 2 (two) times daily with a meal for 14 days.  . norethindrone-ethinyl estradiol-iron (LARIN FE 1.5/30) 1.5-30 MG-MCG tablet Take 1 tablet by mouth daily. Skip inactive pills x 2 mo, starting new pack immediately, then take for third month  . rizatriptan (MAXALT) 10 MG tablet TAKE ONE-HALF TO ONE TABLET BY MOUTH AS NEEDED FOR MIGRAINE. MAY REPEAT IN TWO HOURS IF NEEDED  . topiramate (TOPAMAX) 25 MG tablet TAKE 1 TABLET BY MOUTH TWICE DAILY  . Ubrogepant (UBRELVY) 50 MG TABS Take 50 mg by mouth as directed. Take at onset of headache, may repeat after 2 hours if headache still present. Max 2 tabs per day.   No facility-administered encounter medications on file as of 02/14/2019.     Review of Systems  Constitutional: Negative for chills and fever.  Eyes: Negative for visual disturbance.  Respiratory: Negative for chest tightness and shortness of breath.   Cardiovascular: Negative for chest pain and leg swelling.  Musculoskeletal: Negative for back pain and gait problem.  Skin: Negative for rash.  Neurological: Positive for headaches. Negative for dizziness and light-headedness.  Psychiatric/Behavioral: Negative for agitation and behavioral problems.  All other systems reviewed and are negative.   Observations/Objective: Patient sounds comfortable on the phone and in no acute distress  Assessment  and Plan: Problem List Items Addressed This Visit      Cardiovascular and Mediastinum   Intractable migraine without aura and without status migrainosus - Primary     Endocrine   Diabetes mellitus without complication (HCC)       Follow Up Instructions:  Follow up 3 months, she did like the Ubrelvy and uses it for migraines but it does make  her very tired so she still keeps the Maxalt for days that she does not want to be so groggy.  Her sugars have been doing well, continue metformin.   I discussed the assessment and treatment plan with the patient. The patient was provided an opportunity to ask questions and all were answered. The patient agreed with the plan and demonstrated an understanding of the instructions.   The patient was advised to call back or seek an in-person evaluation if the symptoms worsen or if the condition fails to improve as anticipated.  The above assessment and management plan was discussed with the patient. The patient verbalized understanding of and has agreed to the management plan. Patient is aware to call the clinic if symptoms persist or worsen. Patient is aware when to return to the clinic for a follow-up visit. Patient educated on when it is appropriate to go to the emergency department.    I provided 10 minutes of non-face-to-face time during this encounter.    Nils Pyle, MD

## 2019-02-15 ENCOUNTER — Ambulatory Visit: Payer: PRIVATE HEALTH INSURANCE | Admitting: Family Medicine

## 2019-02-15 ENCOUNTER — Other Ambulatory Visit: Payer: Self-pay | Admitting: Family Medicine

## 2019-02-15 DIAGNOSIS — G43809 Other migraine, not intractable, without status migrainosus: Secondary | ICD-10-CM

## 2019-03-05 ENCOUNTER — Other Ambulatory Visit: Payer: Self-pay

## 2019-03-05 ENCOUNTER — Ambulatory Visit (INDEPENDENT_AMBULATORY_CARE_PROVIDER_SITE_OTHER): Payer: PRIVATE HEALTH INSURANCE | Admitting: Family

## 2019-03-05 ENCOUNTER — Encounter: Payer: Self-pay | Admitting: Family

## 2019-03-05 DIAGNOSIS — H66003 Acute suppurative otitis media without spontaneous rupture of ear drum, bilateral: Secondary | ICD-10-CM

## 2019-03-05 MED ORDER — DOXYCYCLINE HYCLATE 100 MG PO TABS: 100.0000 mg | ORAL_TABLET | Freq: Two times a day (BID) | ORAL | 0 refills | Status: DC

## 2019-03-05 NOTE — Progress Notes (Signed)
   Virtual Visit via telephone Note  I connected with Ruth King on 03/05/19 at 2:46 AM by telephone and verified that I am speaking with the correct person using two identifiers. Ruth King is currently located at work and no one is currently with her during visit. The provider, [name], FNP is located in their office at time of visit.  I discussed the limitations, risks, security and privacy concerns of performing an evaluation and management service by telephone and the availability of in person appointments. I also discussed with the patient that there may be a patient responsible charge related to this service. The patient expressed understanding and agreed to proceed.   History and Present Illness:  Otalgia   There is pain in both ears. This is a new problem. The current episode started in the past 7 days. The problem occurs every few hours. There has been no fever. The pain is at a severity of 8/10. Associated symptoms include coughing (some) and headaches. Pertinent negatives include no ear discharge, rhinorrhea or sore throat. Associated symptoms comments: Dizziness . She has tried nothing for the symptoms. The treatment provided no relief.      Review of Systems  HENT: Positive for ear pain. Negative for ear discharge, rhinorrhea and sore throat.   Respiratory: Positive for cough (some).   Neurological: Positive for headaches.  All other systems reviewed and are negative.    Observations/Objective: No SOB or distress noted  Assessment and Plan: 1. Acute suppurative otitis media of both ears without spontaneous rupture of tympanic membranes, recurrence not specified - Take meds as prescribed - Use a cool mist humidifier  -Use saline nose sprays frequently -Force fluids -For any cough or congestion  Use plain Mucinex- regular strength or max strength is fine -For fever or aces or pains- take tylenol or ibuprofen. -Throat lozenges if help -RTO if symptoms  worsens or do not improve - doxycycline (VIBRA-TABS) 100 MG tablet; Take 1 tablet (100 mg total) by mouth 2 (two) times daily.  Dispense: 20 tablet; Refill: 0     I discussed the assessment and treatment plan with the patient. The patient was provided an opportunity to ask questions and all were answered. The patient agreed with the plan and demonstrated an understanding of the instructions.   The patient was advised to call back or seek an in-person evaluation if the symptoms worsen or if the condition fails to improve as anticipated.  The above assessment and management plan was discussed with the patient. The patient verbalized understanding of and has agreed to the management plan. Patient is aware to call the clinic if symptoms persist or worsen. Patient is aware when to return to the clinic for a follow-up visit. Patient educated on when it is appropriate to go to the emergency department.   Time call ended:  3:01pm  I provided 15 minutes of non-face-to-face time during this encounter.    [name], FNP

## 2019-03-07 ENCOUNTER — Telehealth: Payer: Self-pay | Admitting: Family Medicine

## 2019-03-07 NOTE — Telephone Encounter (Signed)
Had tele visit with Neysa Bonito 6/2-please advise

## 2019-03-07 NOTE — Telephone Encounter (Signed)
PT mother has called states that the pt is  Needing a note for work, pt had televisit earlier this week with Neysa Bonito and pt was told not to put anything in her ears due to ear infection. Pt job is requiring her to wear ear plugs in certain areas and pt is wanting note stating that she can't due to ear infection. Please call mom once letter is ready

## 2019-03-07 NOTE — Telephone Encounter (Signed)
Letter placed up front - patient aware.  °

## 2019-03-07 NOTE — Telephone Encounter (Signed)
Ok for patient not to wear ear plugs for next week.

## 2019-03-11 ENCOUNTER — Other Ambulatory Visit: Payer: Self-pay

## 2019-03-11 ENCOUNTER — Telehealth (INDEPENDENT_AMBULATORY_CARE_PROVIDER_SITE_OTHER): Payer: PRIVATE HEALTH INSURANCE | Admitting: Family Medicine

## 2019-03-11 ENCOUNTER — Encounter: Payer: Self-pay | Admitting: Family Medicine

## 2019-03-11 DIAGNOSIS — H8303 Labyrinthitis, bilateral: Secondary | ICD-10-CM

## 2019-03-11 DIAGNOSIS — F339 Major depressive disorder, recurrent, unspecified: Secondary | ICD-10-CM | POA: Diagnosis not present

## 2019-03-11 MED ORDER — PREDNISONE 20 MG PO TABS: ORAL_TABLET | ORAL | 0 refills | Status: DC

## 2019-03-11 NOTE — Progress Notes (Signed)
Virtual Visit via my chart video note  I connected with Ruth King on 03/11/19 at 1443 by video and verified that I am speaking with the correct person using two identifiers. Ruth King is currently located at home and no other people are currently with her during visit. The provider, Elige RadonJoshua A Iris Hairston, MD is located in their office at time of visit.  Call ended at 1459  I discussed the limitations, risks, security and privacy concerns of performing an evaluation and management service by telephone and the availability of in person appointments. I also discussed with the patient that there may be a patient responsible charge related to this service. The patient expressed understanding and agreed to proceed.   History and Present Illness: Patient has pain and spinning and dizziness. Both ears are hurting. And she has been taking the doxycycline and zyrtec and flonase and nothing is improving. Patient denies any drainage or fevers or chills. Patient denies shortness of breath.    Patient has been having depression that has been going for 2 years.  She has been feeling down and depressed. She had a passing thoughts of suicide many months ago.  She wants to go to counseling.  She says a lot of it stems from different relationships over the past few years, she had a girlfriend that did not end well last year and that led to a lot more depression.  She says it is come and gone over the past 2 years but is significantly up more recently especially with the coronavirus and being at home more.  She has a lack of desire and just feeling generally down and crying spells and more anxiety as well.  No diagnosis found.  Outpatient Encounter Medications as of 03/11/2019  Medication Sig  . albuterol (PROVENTIL HFA;VENTOLIN HFA) 108 (90 Base) MCG/ACT inhaler Inhale 2 puffs into the lungs every 6 (six) hours as needed for wheezing or shortness of breath.  . cetirizine (ZYRTEC) 10 MG tablet Take 1  tablet (10 mg total) by mouth at bedtime.  Marland Kitchen. doxycycline (VIBRA-TABS) 100 MG tablet Take 1 tablet (100 mg total) by mouth 2 (two) times daily.  . fluticasone (FLONASE) 50 MCG/ACT nasal spray Place 2 sprays into both nostrils daily. (Patient taking differently: Place 2 sprays into both nostrils daily as needed for allergies or rhinitis. )  . metFORMIN (GLUCOPHAGE) 500 MG tablet Take 1 tablet (500 mg total) by mouth daily with breakfast.  . montelukast (SINGULAIR) 10 MG tablet Take 1 tablet (10 mg total) by mouth at bedtime. (Needs to be seen before next refill)  . norethindrone-ethinyl estradiol-iron (LARIN FE 1.5/30) 1.5-30 MG-MCG tablet Take 1 tablet by mouth daily. Skip inactive pills x 2 mo, starting new pack immediately, then take for third month  . rizatriptan (MAXALT) 10 MG tablet TAKE ONE-HALF TO ONE TABLET BY MOUTH AS NEEDED FOR MIGRAINE, MAY REPEAT IN TWO HOURS IF NEEDED  . topiramate (TOPAMAX) 25 MG tablet TAKE 1 TABLET BY MOUTH TWICE DAILY  . Ubrogepant (UBRELVY) 50 MG TABS Take 50 mg by mouth as directed. Take at onset of headache, may repeat after 2 hours if headache still present. Max 2 tabs per day.   No facility-administered encounter medications on file as of 03/11/2019.     Review of Systems  Constitutional: Negative for chills and fever.  HENT: Positive for ear pain and sinus pain. Negative for congestion, ear discharge, rhinorrhea, sinus pressure, sneezing and sore throat.   Eyes: Negative for  redness and visual disturbance.  Respiratory: Negative for chest tightness and shortness of breath.   Cardiovascular: Negative for chest pain and leg swelling.  Musculoskeletal: Negative for back pain and gait problem.  Skin: Negative for rash.  Neurological: Positive for dizziness and headaches. Negative for weakness and light-headedness.  Psychiatric/Behavioral: Negative for agitation and behavioral problems.  All other systems reviewed and are negative.   Observations/Objective:  Wound looks comfortable on the video visit, no signs of acute distress  Assessment and Plan: Problem List Items Addressed This Visit      Other   Depression, recurrent ([place])   Relevant Orders   Ambulatory referral to Psychology    Other Visit Diagnoses    Labyrinthitis of both ears    -  Primary   Relevant Medications   predniSONE (DELTASONE) 20 MG tablet       Follow Up Instructions: We will do referral to psychology for the patient,  She is still on doxycycline but with the dizziness and spinning sensation it sounds like labyrinthitis   I discussed the assessment and treatment plan with the patient. The patient was provided an opportunity to ask questions and all were answered. The patient agreed with the plan and demonstrated an understanding of the instructions.   The patient was advised to call back or seek an in-person evaluation if the symptoms worsen or if the condition fails to improve as anticipated.  The above assessment and management plan was discussed with the patient. The patient verbalized understanding of and has agreed to the management plan. Patient is aware to call the clinic if symptoms persist or worsen. Patient is aware when to return to the clinic for a follow-up visit. Patient educated on when it is appropriate to go to the emergency department.    I provided 16 minutes of non-face-to-face time during this encounter.    Worthy Rancher, MD

## 2019-03-12 ENCOUNTER — Other Ambulatory Visit: Payer: Self-pay | Admitting: Family Medicine

## 2019-03-12 DIAGNOSIS — G43809 Other migraine, not intractable, without status migrainosus: Secondary | ICD-10-CM

## 2019-03-18 ENCOUNTER — Other Ambulatory Visit: Payer: Self-pay | Admitting: *Deleted

## 2019-03-18 DIAGNOSIS — E119 Type 2 diabetes mellitus without complications: Secondary | ICD-10-CM

## 2019-03-18 MED ORDER — METFORMIN HCL 500 MG PO TABS: 500.0000 mg | ORAL_TABLET | Freq: Every day | ORAL | 2 refills | Status: DC

## 2019-03-20 ENCOUNTER — Telehealth (INDEPENDENT_AMBULATORY_CARE_PROVIDER_SITE_OTHER): Payer: PRIVATE HEALTH INSURANCE | Admitting: Family Medicine

## 2019-03-20 ENCOUNTER — Encounter: Payer: Self-pay | Admitting: Family Medicine

## 2019-03-20 DIAGNOSIS — G43019 Migraine without aura, intractable, without status migrainosus: Secondary | ICD-10-CM

## 2019-03-20 NOTE — Progress Notes (Signed)
Virtual Visit via my chart video note  I connected with Ruth DixonSierra M Tourigny on 03/20/19 at 1459 by video and verified that I am speaking with the correct person using two identifiers. Ruth DixonSierra M Schlesinger is currently located at home and no other people are currently with her during visit. The provider, Elige RadonJoshua A Charnae Lill, MD is located in their office at time of visit.  Call ended at 1506  I discussed the limitations, risks, security and privacy concerns of performing an evaluation and management service by video and the availability of in person appointments. I also discussed with the patient that there may be a patient responsible charge related to this service. The patient expressed understanding and agreed to proceed.   History and Present Illness: Patient has had a migraine since yesterday.  She used topamax and maxalt and it is better today.  She says it is like her usual migraines and is on both sides and goes towards the back.  She said yesterday she had a lot of photophobia and phonophobia but today she is doing better in that regards.  No diagnosis found.  Outpatient Encounter Medications as of 03/20/2019  Medication Sig  . albuterol (PROVENTIL HFA;VENTOLIN HFA) 108 (90 Base) MCG/ACT inhaler Inhale 2 puffs into the lungs every 6 (six) hours as needed for wheezing or shortness of breath.  . cetirizine (ZYRTEC) 10 MG tablet Take 1 tablet (10 mg total) by mouth at bedtime.  Marland Kitchen. doxycycline (VIBRA-TABS) 100 MG tablet Take 1 tablet (100 mg total) by mouth 2 (two) times daily.  . fluticasone (FLONASE) 50 MCG/ACT nasal spray Place 2 sprays into both nostrils daily. (Patient taking differently: Place 2 sprays into both nostrils daily as needed for allergies or rhinitis. )  . metFORMIN (GLUCOPHAGE) 500 MG tablet Take 1 tablet (500 mg total) by mouth daily with breakfast.  . montelukast (SINGULAIR) 10 MG tablet Take 1 tablet (10 mg total) by mouth at bedtime. (Needs to be seen before next refill)  .  norethindrone-ethinyl estradiol-iron (LARIN FE 1.5/30) 1.5-30 MG-MCG tablet Take 1 tablet by mouth daily. Skip inactive pills x 2 mo, starting new pack immediately, then take for third month  . predniSONE (DELTASONE) 20 MG tablet 2 po at same time daily for 5 days  . rizatriptan (MAXALT) 10 MG tablet TAKE ONE-HALF TO ONE TABLET BY MOUTH AS NEEDED FOR MIGRAINE, MAY REPEAT IN TWO HOURS IF NEEDED  . topiramate (TOPAMAX) 25 MG tablet TAKE 1 TABLET BY MOUTH TWICE DAILY  . Ubrogepant (UBRELVY) 50 MG TABS Take 50 mg by mouth as directed. Take at onset of headache, may repeat after 2 hours if headache still present. Max 2 tabs per day.   No facility-administered encounter medications on file as of 03/20/2019.     Review of Systems  Constitutional: Negative for chills and fever.  Eyes: Negative for visual disturbance.  Respiratory: Negative for chest tightness and shortness of breath.   Cardiovascular: Negative for chest pain and leg swelling.  Musculoskeletal: Negative for back pain and gait problem.  Skin: Negative for rash.  Neurological: Positive for headaches. Negative for dizziness, weakness, light-headedness and numbness.  Psychiatric/Behavioral: Negative for agitation and behavioral problems.  All other systems reviewed and are negative.   Observations/Objective: Patient sounds and looks comfortable via video  Assessment and Plan: Problem List Items Addressed This Visit      Cardiovascular and Mediastinum   Intractable migraine without aura and without status migrainosus - Primary  Follow Up Instructions:  As needed, continue current medication, may gently patient needed a note for work because she missed.   I discussed the assessment and treatment plan with the patient. The patient was provided an opportunity to ask questions and all were answered. The patient agreed with the plan and demonstrated an understanding of the instructions.   The patient was advised to call back  or seek an in-person evaluation if the symptoms worsen or if the condition fails to improve as anticipated.  The above assessment and management plan was discussed with the patient. The patient verbalized understanding of and has agreed to the management plan. Patient is aware to call the clinic if symptoms persist or worsen. Patient is aware when to return to the clinic for a follow-up visit. Patient educated on when it is appropriate to go to the emergency department.    I provided 7 minutes of non-face-to-face time during this encounter.    Worthy Rancher, MD

## 2019-03-21 ENCOUNTER — Ambulatory Visit: Payer: No Typology Code available for payment source | Admitting: Professional

## 2019-03-21 ENCOUNTER — Encounter: Payer: Self-pay | Admitting: Family Medicine

## 2019-03-23 ENCOUNTER — Ambulatory Visit (INDEPENDENT_AMBULATORY_CARE_PROVIDER_SITE_OTHER): Payer: No Typology Code available for payment source | Admitting: Professional

## 2019-03-23 DIAGNOSIS — F411 Generalized anxiety disorder: Secondary | ICD-10-CM | POA: Diagnosis not present

## 2019-03-23 DIAGNOSIS — F331 Major depressive disorder, recurrent, moderate: Secondary | ICD-10-CM | POA: Diagnosis not present

## 2019-03-26 ENCOUNTER — Other Ambulatory Visit: Payer: Self-pay

## 2019-03-26 ENCOUNTER — Ambulatory Visit (INDEPENDENT_AMBULATORY_CARE_PROVIDER_SITE_OTHER): Payer: PRIVATE HEALTH INSURANCE | Admitting: Family Medicine

## 2019-03-26 DIAGNOSIS — G43909 Migraine, unspecified, not intractable, without status migrainosus: Secondary | ICD-10-CM

## 2019-03-26 NOTE — Patient Instructions (Signed)
We discussed that estrogen-containing birth control can cause increased frequency of migraine headaches.  We discussed alternatives including Nexplanon and Mirena IUD.  Both of these protect against pregnancy fairly well and have less frequency of impact on migraines.  I would like you discussed this with Dr. Warrick Parisian going forward should you decide you want to make the switch.  Additionally, we also talked about FMLA paperwork.  This may be of benefit to you if you have frequent migraine headaches and frequently have to miss work as a result.

## 2019-03-26 NOTE — Progress Notes (Signed)
Telephone visit  Subjective: CC: migraine PCP: Dettinger, Fransisca Kaufmann, MD Ruth King is a 18 y.o. female calls for telephone consult today. Patient provides verbal consent for consult held via phone.  Location of patient: home Location of provider: Working remotely from home Others present for call: none  1. Migraine Patient reports that she woke up around 1:00 in the morning with a migraine headache that lasted most of the day.  She took her medications as prescribed and the migraine has since resolved.  Unfortunately, she did miss work and needs a note to return to work.  She denies any nausea, vomiting, ongoing headache, visual disturbance, dizziness, falls or loss of consciousness.    ROS: Per HPI  Allergies  Allergen Reactions  . Bee Venom Shortness Of Breath  . Azithromycin Other (See Comments)    Not effective   . Cephalosporins Hives   Past Medical History:  Diagnosis Date  . Asthma   . Nausea & vomiting     Current Outpatient Medications:  .  albuterol (PROVENTIL HFA;VENTOLIN HFA) 108 (90 Base) MCG/ACT inhaler, Inhale 2 puffs into the lungs every 6 (six) hours as needed for wheezing or shortness of breath., Disp: 2 Inhaler, Rfl: 3 .  cetirizine (ZYRTEC) 10 MG tablet, Take 1 tablet (10 mg total) by mouth at bedtime., Disp: 90 tablet, Rfl: 3 .  fluticasone (FLONASE) 50 MCG/ACT nasal spray, Place 2 sprays into both nostrils daily. (Patient taking differently: Place 2 sprays into both nostrils daily as needed for allergies or rhinitis. ), Disp: 16 g, Rfl: 6 .  metFORMIN (GLUCOPHAGE) 500 MG tablet, Take 1 tablet (500 mg total) by mouth daily with breakfast., Disp: 30 tablet, Rfl: 2 .  montelukast (SINGULAIR) 10 MG tablet, Take 1 tablet (10 mg total) by mouth at bedtime. (Needs to be seen before next refill), Disp: 90 tablet, Rfl: 3 .  norethindrone-ethinyl estradiol-iron (LARIN FE 1.5/30) 1.5-30 MG-MCG tablet, Take 1 tablet by mouth daily. Skip inactive pills x 2 mo,  starting new pack immediately, then take for third month, Disp: 84 tablet, Rfl: 4 .  rizatriptan (MAXALT) 10 MG tablet, TAKE ONE-HALF TO ONE TABLET BY MOUTH AS NEEDED FOR MIGRAINE, MAY REPEAT IN TWO HOURS IF NEEDED, Disp: 10 tablet, Rfl: 0 .  topiramate (TOPAMAX) 25 MG tablet, TAKE 1 TABLET BY MOUTH TWICE DAILY, Disp: 60 tablet, Rfl: 2 .  Ubrogepant (UBRELVY) 50 MG TABS, Take 50 mg by mouth as directed. Take at onset of headache, may repeat after 2 hours if headache still present. Max 2 tabs per day., Disp: 30 tablet, Rfl: 0  Assessment/ Plan: 18 y.o. female   1. Migraine without status migrainosus, not intractable, unspecified migraine type Resolved with medications.  She needs note and I have placed this in her chart.  I did discuss with her that she should consider alternative form of birth control.  I wonder if the estrogen in her birth control pills increasing the frequency of migraine headaches.  We discussed alternatives including Nexplanon or IUD.  I will defer to her PCP for ongoing discussion of this.  Follow-up PRN   Start time: 4:09pm End time: 4:17pm  Total time spent on patient care (including telephone call/ virtual visit): 13 minutes  [place], [place] [phone]

## 2019-03-27 ENCOUNTER — Ambulatory Visit: Payer: No Typology Code available for payment source | Admitting: Professional

## 2019-03-28 ENCOUNTER — Other Ambulatory Visit: Payer: Self-pay

## 2019-03-28 ENCOUNTER — Ambulatory Visit (INDEPENDENT_AMBULATORY_CARE_PROVIDER_SITE_OTHER): Payer: PRIVATE HEALTH INSURANCE | Admitting: Family Medicine

## 2019-03-28 DIAGNOSIS — G43019 Migraine without aura, intractable, without status migrainosus: Secondary | ICD-10-CM

## 2019-03-28 NOTE — Progress Notes (Signed)
Telephone visit  Subjective: CC: Migraine headache PCP: Dettinger, Elige RadonJoshua A, MD ONG:EXBMWUHPI:Ruth King is a 18 y.o. female calls for telephone consult today. Patient provides verbal consent for consult held via phone.  Location of patient: Home Location of provider: [facility] Others present for call: None  1.  Migraine headache Patient reports that she had recurrence of migraine headache yesterday during work.  She feels that this is triggered by becoming upset at work.  She did not have to leave work but does state that it was pretty severe yesterday such that she had to take her migraine medications.  She is compliant with Topamax as prophylaxis.  She is never seen a neurologist but would be amenable to seeing one.  Denies any blurry vision, double vision, loss of vision, balance issues, confusion or slurred speech.  She does report mild nausea and fatigue but states that the fatigue is related to use of the migraine medicine.  She often becomes sleepy afterwards.  ROS: Per HPI  Allergies  Allergen Reactions  . Bee Venom Shortness Of Breath  . Azithromycin Other (See Comments)    Not effective   . Cephalosporins Hives   Past Medical History:  Diagnosis Date  . Asthma   . Nausea & vomiting     Current Outpatient Medications:  .  albuterol (PROVENTIL HFA;VENTOLIN HFA) 108 (90 Base) MCG/ACT inhaler, Inhale 2 puffs into the lungs every 6 (six) hours as needed for wheezing or shortness of breath., Disp: 2 Inhaler, Rfl: 3 .  cetirizine (ZYRTEC) 10 MG tablet, Take 1 tablet (10 mg total) by mouth at bedtime., Disp: 90 tablet, Rfl: 3 .  fluticasone (FLONASE) 50 MCG/ACT nasal spray, Place 2 sprays into both nostrils daily. (Patient taking differently: Place 2 sprays into both nostrils daily as needed for allergies or rhinitis. ), Disp: 16 g, Rfl: 6 .  metFORMIN (GLUCOPHAGE) 500 MG tablet, Take 1 tablet (500 mg total) by mouth daily with breakfast., Disp: 30 tablet, Rfl: 2 .  montelukast  (SINGULAIR) 10 MG tablet, Take 1 tablet (10 mg total) by mouth at bedtime. (Needs to be seen before next refill), Disp: 90 tablet, Rfl: 3 .  norethindrone-ethinyl estradiol-iron (LARIN FE 1.5/30) 1.5-30 MG-MCG tablet, Take 1 tablet by mouth daily. Skip inactive pills x 2 mo, starting new pack immediately, then take for third month, Disp: 84 tablet, Rfl: 4 .  rizatriptan (MAXALT) 10 MG tablet, TAKE ONE-HALF TO ONE TABLET BY MOUTH AS NEEDED FOR MIGRAINE, MAY REPEAT IN TWO HOURS IF NEEDED, Disp: 10 tablet, Rfl: 0 .  topiramate (TOPAMAX) 25 MG tablet, TAKE 1 TABLET BY MOUTH TWICE DAILY, Disp: 60 tablet, Rfl: 2 .  Ubrogepant (UBRELVY) 50 MG TABS, Take 50 mg by mouth as directed. Take at onset of headache, may repeat after 2 hours if headache still present. Max 2 tabs per day., Disp: 30 tablet, Rfl: 0  Assessment/ Plan: 18 y.o. female   1. Intractable migraine without aura and without status migrainosus Ongoing and frequent migraine headaches.  I have placed a referral to neurology for further evaluation.  Have also asked that patient follow-up with her PCP next week for discussion of birth control alternatives.  She may be a good candidate for the injectables migraine medication given recurrent migraine headaches.  I placed a new note in her file for her employer.  We discussed red flag signs and symptoms which would warrant emergent evaluation emergency department. - Ambulatory referral to Neurology   Start time: 1:06pm End time:  1:11pm  Total time spent on patient care (including telephone call/ virtual visit): 9 minutes  [place], [place] [phone]

## 2019-04-01 ENCOUNTER — Ambulatory Visit (INDEPENDENT_AMBULATORY_CARE_PROVIDER_SITE_OTHER): Payer: No Typology Code available for payment source | Admitting: Professional

## 2019-04-01 DIAGNOSIS — F331 Major depressive disorder, recurrent, moderate: Secondary | ICD-10-CM | POA: Diagnosis not present

## 2019-04-01 DIAGNOSIS — F411 Generalized anxiety disorder: Secondary | ICD-10-CM

## 2019-04-03 ENCOUNTER — Ambulatory Visit (INDEPENDENT_AMBULATORY_CARE_PROVIDER_SITE_OTHER): Payer: PRIVATE HEALTH INSURANCE | Admitting: Family Medicine

## 2019-04-03 ENCOUNTER — Encounter: Payer: Self-pay | Admitting: Family Medicine

## 2019-04-03 DIAGNOSIS — G43009 Migraine without aura, not intractable, without status migrainosus: Secondary | ICD-10-CM | POA: Diagnosis not present

## 2019-04-03 DIAGNOSIS — G43809 Other migraine, not intractable, without status migrainosus: Secondary | ICD-10-CM | POA: Diagnosis not present

## 2019-04-03 MED ORDER — RIZATRIPTAN BENZOATE 10 MG PO TABS: ORAL_TABLET | ORAL | 5 refills | Status: DC

## 2019-04-03 MED ORDER — UBRELVY 50 MG PO TABS: 50.0000 mg | ORAL_TABLET | ORAL | 5 refills | Status: DC

## 2019-04-03 MED ORDER — TOPIRAMATE 50 MG PO TABS: 50.0000 mg | ORAL_TABLET | Freq: Two times a day (BID) | ORAL | 1 refills | Status: DC

## 2019-04-03 NOTE — Progress Notes (Signed)
Virtual Visit via telephone Note  I connected with Laurell Roof on 04/03/19 at 1143 by telephone and verified that I am speaking with the correct person using two identifiers. ESMEE FALLAW is currently located at home and no other people are currently with her during visit. The provider, Fransisca Kaufmann , MD is located in their office at time of visit.  Call ended at 1202  I discussed the limitations, risks, security and privacy concerns of performing an evaluation and management service by telephone and the availability of in person appointments. I also discussed with the patient that there may be a patient responsible charge related to this service. The patient expressed understanding and agreed to proceed.   History and Present Illness: Patient is calling in for migraine recheck.  She had one major migraine this month but had been doing a lot better on the topiramate.  She has been having only minor headaches every week but yesterday was the first major headache of the month where she actually use the Abrol V.  Most of the time she is using the Maxalt and the Topamax.  She would like to increase on the Topamax to see if I can help more in the prevention of the minor migraines.  Patient went to the female doctor said that she thinks the birth control had something to do with the migraines.   Patient doesn't think it is the birth control that causes the migraines.  She would like to continue birth control  No diagnosis found.  Outpatient Encounter Medications as of 04/03/2019  Medication Sig  . albuterol (PROVENTIL HFA;VENTOLIN HFA) 108 (90 Base) MCG/ACT inhaler Inhale 2 puffs into the lungs every 6 (six) hours as needed for wheezing or shortness of breath.  . cetirizine (ZYRTEC) 10 MG tablet Take 1 tablet (10 mg total) by mouth at bedtime.  . fluticasone (FLONASE) 50 MCG/ACT nasal spray Place 2 sprays into both nostrils daily. (Patient taking differently: Place 2 sprays into both  nostrils daily as needed for allergies or rhinitis. )  . metFORMIN (GLUCOPHAGE) 500 MG tablet Take 1 tablet (500 mg total) by mouth daily with breakfast.  . montelukast (SINGULAIR) 10 MG tablet Take 1 tablet (10 mg total) by mouth at bedtime. (Needs to be seen before next refill)  . norethindrone-ethinyl estradiol-iron (LARIN FE 1.5/30) 1.5-30 MG-MCG tablet Take 1 tablet by mouth daily. Skip inactive pills x 2 mo, starting new pack immediately, then take for third month  . rizatriptan (MAXALT) 10 MG tablet TAKE ONE-HALF TO ONE TABLET BY MOUTH AS NEEDED FOR MIGRAINE, MAY REPEAT IN TWO HOURS IF NEEDED  . topiramate (TOPAMAX) 25 MG tablet TAKE 1 TABLET BY MOUTH TWICE DAILY  . Ubrogepant (UBRELVY) 50 MG TABS Take 50 mg by mouth as directed. Take at onset of headache, may repeat after 2 hours if headache still present. Max 2 tabs per day.   No facility-administered encounter medications on file as of 04/03/2019.     Review of Systems  Constitutional: Negative for chills and fever.  Eyes: Negative for redness and visual disturbance.  Respiratory: Negative for chest tightness and shortness of breath.   Cardiovascular: Negative for chest pain and leg swelling.  Musculoskeletal: Negative for back pain and gait problem.  Skin: Negative for rash.  Neurological: Positive for headaches. Negative for dizziness and light-headedness.  Psychiatric/Behavioral: Negative for agitation and behavioral problems.  All other systems reviewed and are negative.   Observations/Objective: Patient sounds comfortable and in no acute  distress  Assessment and Plan: Problem List Items Addressed This Visit      Cardiovascular and Mediastinum   Migraine headache - Primary   Relevant Medications   topiramate (TOPAMAX) 50 MG tablet   rizatriptan (MAXALT) 10 MG tablet   Ubrogepant (UBRELVY) 50 MG TABS       Follow Up Instructions:  Follow-up in 3 to 4 months for recheck for depression and physical exam   I  discussed the assessment and treatment plan with the patient. The patient was provided an opportunity to ask questions and all were answered. The patient agreed with the plan and demonstrated an understanding of the instructions.   The patient was advised to call back or seek an in-person evaluation if the symptoms worsen or if the condition fails to improve as anticipated.  The above assessment and management plan was discussed with the patient. The patient verbalized understanding of and has agreed to the management plan. Patient is aware to call the clinic if symptoms persist or worsen. Patient is aware when to return to the clinic for a follow-up visit. Patient educated on when it is appropriate to go to the emergency department.    I provided 9 minutes of non-face-to-face time during this encounter.    Nils PyleJoshua A , MD

## 2019-04-08 ENCOUNTER — Ambulatory Visit (INDEPENDENT_AMBULATORY_CARE_PROVIDER_SITE_OTHER): Payer: PRIVATE HEALTH INSURANCE | Admitting: Family

## 2019-04-08 ENCOUNTER — Ambulatory Visit (INDEPENDENT_AMBULATORY_CARE_PROVIDER_SITE_OTHER): Payer: No Typology Code available for payment source | Admitting: Professional

## 2019-04-08 ENCOUNTER — Other Ambulatory Visit: Payer: Self-pay

## 2019-04-08 ENCOUNTER — Encounter: Payer: Self-pay | Admitting: Family

## 2019-04-08 DIAGNOSIS — F411 Generalized anxiety disorder: Secondary | ICD-10-CM

## 2019-04-08 DIAGNOSIS — R112 Nausea with vomiting, unspecified: Secondary | ICD-10-CM

## 2019-04-08 DIAGNOSIS — F331 Major depressive disorder, recurrent, moderate: Secondary | ICD-10-CM | POA: Diagnosis not present

## 2019-04-08 DIAGNOSIS — R197 Diarrhea, unspecified: Secondary | ICD-10-CM | POA: Diagnosis not present

## 2019-04-08 MED ORDER — ONDANSETRON HCL 4 MG PO TABS: 4.0000 mg | ORAL_TABLET | Freq: Three times a day (TID) | ORAL | 0 refills | Status: DC | PRN

## 2019-04-08 NOTE — Progress Notes (Signed)
   Virtual Visit via telephone Note  I connected with Ruth King on 04/08/19 at 12:56 pm by telephone and verified that I am speaking with the correct person using two identifiers. Ruth King is currently located at home  and no one is currently with her during visit. The provider, Evelina Dun, FNP is located in their office at time of visit.  I discussed the limitations, risks, security and privacy concerns of performing an evaluation and management service by telephone and the availability of in person appointments. I also discussed with the patient that there may be a patient responsible charge related to this service. The patient expressed understanding and agreed to proceed.   History and Present Illness:  Diarrhea  This is a new problem. The current episode started in the past 7 days. The problem occurs 5 to 10 times per day. The problem has been gradually improving. The stool consistency is described as watery. Associated symptoms include headaches and vomiting. Pertinent negatives include no abdominal pain, bloating or fever. Nothing aggravates the symptoms. She has tried bismuth subsalicylate for the symptoms. The treatment provided mild relief.      Review of Systems  Constitutional: Negative for fever.  Gastrointestinal: Positive for diarrhea and vomiting. Negative for abdominal pain and bloating.  Neurological: Positive for headaches.  All other systems reviewed and are negative.    Observations/Objective: No SOB or distress   Assessment and Plan: Ruth King comes in today with chief complaint of No chief complaint on file.   Diagnosis and orders addressed:  1. Nausea and vomiting, intractability of vomiting not specified, unspecified vomiting type - ondansetron (ZOFRAN) 4 MG tablet; Take 1 tablet (4 mg total) by mouth every 8 (eight) hours as needed for nausea or vomiting.  Dispense: 20 tablet; Refill: 0  2. Diarrhea, unspecified type  Bland diet  Force fluids Tylenol as needed  Imodium as needed, zofran as needed Return work noted given to return to work RTO if symptoms worsen or do not improve       I discussed the assessment and treatment plan with the patient. The patient was provided an opportunity to ask questions and all were answered. The patient agreed with the plan and demonstrated an understanding of the instructions.   The patient was advised to call back or seek an in-person evaluation if the symptoms worsen or if the condition fails to improve as anticipated.  The above assessment and management plan was discussed with the patient. The patient verbalized understanding of and has agreed to the management plan. Patient is aware to call the clinic if symptoms persist or worsen. Patient is aware when to return to the clinic for a follow-up visit. Patient educated on when it is appropriate to go to the emergency department.   Time call ended:  1:05 pm  I provided 9 minutes of non-face-to-face time during this encounter.    Evelina Dun, FNP

## 2019-04-10 ENCOUNTER — Other Ambulatory Visit: Payer: Self-pay

## 2019-04-10 ENCOUNTER — Encounter: Payer: Self-pay | Admitting: Family Medicine

## 2019-04-10 ENCOUNTER — Ambulatory Visit (INDEPENDENT_AMBULATORY_CARE_PROVIDER_SITE_OTHER): Payer: PRIVATE HEALTH INSURANCE | Admitting: Family Medicine

## 2019-04-10 DIAGNOSIS — R112 Nausea with vomiting, unspecified: Secondary | ICD-10-CM

## 2019-04-10 NOTE — Progress Notes (Signed)
Virtual Visit via telephone Note Due to COVID-19, visit is conducted virtually and was requested by patient. This visit type was conducted due to national recommendations for restrictions regarding the COVID-19 Pandemic (e.g. social distancing) in an effort to limit this patient's exposure and mitigate transmission in our community. All issues noted in this document were discussed and addressed.  A physical exam was not performed with this format.   I connected with Ruth DixonSierra M Depolo on 04/10/19 at 0945 by telephone and verified that I am speaking with the correct person using two identifiers. Ruth DixonSierra M Suddeth is currently located at home and family is currently with them during visit. The provider, Kari BaarsMichelle Kacie Huxtable, FNP is located in their office at time of visit.  I discussed the limitations, risks, security and privacy concerns of performing an evaluation and management service by telephone and the availability of in person appointments. I also discussed with the patient that there may be a patient responsible charge related to this service. The patient expressed understanding and agreed to proceed.  Subjective:  Patient ID: Ruth King, female    DOB: 02/27/2001, 18 y.o.   MRN: [id number]  Chief Complaint:  Nausea   HPI: Ruth DixonSierra M Heinrichs is a 18 y.o. female presenting on 04/10/2019 for Nausea   Pt reports continued nausea with minimal vomiting. States she was seen on 04/08/2019 for n/v/d. She reports the diarrhea has stopped. States the vomiting has decreased significantly. States she still becomes nauseated after eating heavy or greasy foods. Pt states she is able to hold liquids and some foods down. States the zofran is beneficial for several hours. She denies weakness, fatigue, fever, dizziness, syncope, hematochezia, melena, hematemesis, abdominal pain, chest pain, confusion, or sick contacts. States she has been out of work due to the nausea and vomiting. She denies decreased urination.    Emesis  This is a new problem. The current episode started in the past 7 days. The problem occurs less than 2 times per day. The problem has been gradually improving. The emesis has an appearance of stomach contents. There has been no fever. Pertinent negatives include no abdominal pain, arthralgias, chest pain, chills, coughing, diarrhea, dizziness, fever, headaches, myalgias, sweats, URI or weight loss. She has tried increased fluids and sleep (Zofran) for the symptoms. The treatment provided significant relief.     Relevant past medical, surgical, family, and social history reviewed and updated as indicated.  Allergies and medications reviewed and updated.   Past Medical History:  Diagnosis Date  . Asthma   . Nausea & vomiting     Past Surgical History:  Procedure Laterality Date  . ADENOIDECTOMY    . APPENDECTOMY    . tubes in ears      Social History   Socioeconomic History  . Marital status: Single    Spouse name: Not on file  . Number of children: Not on file  . Years of education: Not on file  . Highest education level: Not on file  Occupational History  . Not on file  Social Needs  . Financial resource strain: Not on file  . Food insecurity    Worry: Not on file    Inability: Not on file  . Transportation needs    Medical: Not on file    Non-medical: Not on file  Tobacco Use  . Smoking status: Never Smoker  . Smokeless tobacco: Never Used  Substance and Sexual Activity  . Alcohol use: No  . Drug use: No  .  Sexual activity: Not on file  Lifestyle  . Physical activity    Days per week: Not on file    Minutes per session: Not on file  . Stress: Not on file  Relationships  . Social Musicianconnections    Talks on phone: Not on file    Gets together: Not on file    Attends religious service: Not on file    Active member of club or organization: Not on file    Attends meetings of clubs or organizations: Not on file    Relationship status: Not on file  .  Intimate partner violence    Fear of current or ex partner: Not on file    Emotionally abused: Not on file    Physically abused: Not on file    Forced sexual activity: Not on file  Other Topics Concern  . Not on file  Social History Narrative   Lives at home with mom, dad and two sisters, attends [employer] is in the 10th grade.     Outpatient Encounter Medications as of 04/10/2019  Medication Sig  . albuterol (PROVENTIL HFA;VENTOLIN HFA) 108 (90 Base) MCG/ACT inhaler Inhale 2 puffs into the lungs every 6 (six) hours as needed for wheezing or shortness of breath.  . cetirizine (ZYRTEC) 10 MG tablet Take 1 tablet (10 mg total) by mouth at bedtime.  . fluticasone (FLONASE) 50 MCG/ACT nasal spray Place 2 sprays into both nostrils daily. (Patient taking differently: Place 2 sprays into both nostrils daily as needed for allergies or rhinitis. )  . metFORMIN (GLUCOPHAGE) 500 MG tablet Take 1 tablet (500 mg total) by mouth daily with breakfast.  . montelukast (SINGULAIR) 10 MG tablet Take 1 tablet (10 mg total) by mouth at bedtime. (Needs to be seen before next refill)  . norethindrone-ethinyl estradiol-iron (LARIN FE 1.5/30) 1.5-30 MG-MCG tablet Take 1 tablet by mouth daily. Skip inactive pills x 2 mo, starting new pack immediately, then take for third month  . ondansetron (ZOFRAN) 4 MG tablet Take 1 tablet (4 mg total) by mouth every 8 (eight) hours as needed for nausea or vomiting.  . rizatriptan (MAXALT) 10 MG tablet May repeat in 2 hours if needed  . topiramate (TOPAMAX) 50 MG tablet Take 1 tablet (50 mg total) by mouth 2 (two) times daily.  Marland Kitchen. Ubrogepant (UBRELVY) 50 MG TABS Take 50 mg by mouth as directed. Take at onset of headache, may repeat after 2 hours if headache still present. Max 2 tabs per day.   No facility-administered encounter medications on file as of 04/10/2019.     Allergies  Allergen Reactions  . Bee Venom Shortness Of Breath  . Azithromycin Other (See Comments)    Not  effective   . Cephalosporins Hives    Review of Systems  Constitutional: Negative for chills, fatigue, fever, unexpected weight change and weight loss.  Respiratory: Negative for cough, chest tightness and shortness of breath.   Cardiovascular: Negative for chest pain and palpitations.  Gastrointestinal: Positive for nausea and vomiting. Negative for abdominal distention, abdominal pain, anal bleeding, blood in stool, constipation, diarrhea and rectal pain.  Genitourinary: Negative for decreased urine volume and difficulty urinating.  Musculoskeletal: Negative for arthralgias, back pain and myalgias.  Neurological: Negative for dizziness, tremors, seizures, syncope, facial asymmetry, speech difficulty, weakness, light-headedness, numbness and headaches.  Psychiatric/Behavioral: Negative for confusion.  All other systems reviewed and are negative.        Observations/Objective: No vital signs or physical exam, this was a  telephone or virtual health encounter.  Pt alert and oriented, answers all questions appropriately, and able to speak in full sentences.    Assessment and Plan: Nicoya was seen today for nausea.  Diagnoses and all orders for this visit:  Nausea and vomiting in adult BRAT diet discussed. Continue Zofran as needed. Advance diet as tolerated. If symptoms persist for more than 3 days follow up needed. Report any new or worsening symptoms. Work note provided.     Follow Up Instructions: Return if symptoms worsen or fail to improve.    I discussed the assessment and treatment plan with the patient. The patient was provided an opportunity to ask questions and all were answered. The patient agreed with the plan and demonstrated an understanding of the instructions.   The patient was advised to call back or seek an in-person evaluation if the symptoms worsen or if the condition fails to improve as anticipated.  The above assessment and management plan was discussed  with the patient. The patient verbalized understanding of and has agreed to the management plan. Patient is aware to call the clinic if symptoms persist or worsen. Patient is aware when to return to the clinic for a follow-up visit. Patient educated on when it is appropriate to go to the emergency department.    I provided 15 minutes of non-face-to-face time during this encounter. The call started at 0945. The call ended at 1000. The other time was used for coordination of care.    Monia Pouch, FNP-C [facility] [address] [place], [place] [address] [phone]

## 2019-04-15 ENCOUNTER — Ambulatory Visit (INDEPENDENT_AMBULATORY_CARE_PROVIDER_SITE_OTHER): Payer: No Typology Code available for payment source | Admitting: Professional

## 2019-04-15 DIAGNOSIS — F411 Generalized anxiety disorder: Secondary | ICD-10-CM

## 2019-04-15 DIAGNOSIS — F331 Major depressive disorder, recurrent, moderate: Secondary | ICD-10-CM | POA: Diagnosis not present

## 2019-04-22 ENCOUNTER — Ambulatory Visit: Payer: No Typology Code available for payment source | Admitting: Professional

## 2019-04-29 ENCOUNTER — Encounter: Payer: Self-pay | Admitting: Neurology

## 2019-04-29 ENCOUNTER — Other Ambulatory Visit: Payer: Self-pay

## 2019-04-29 ENCOUNTER — Telehealth: Payer: Self-pay | Admitting: Neurology

## 2019-04-29 ENCOUNTER — Ambulatory Visit: Payer: PRIVATE HEALTH INSURANCE | Admitting: Neurology

## 2019-04-29 VITALS — BP 129/82 | HR 83 | Ht 72.0 in | Wt 242.0 lb

## 2019-04-29 DIAGNOSIS — G4489 Other headache syndrome: Secondary | ICD-10-CM

## 2019-04-29 DIAGNOSIS — G43119 Migraine with aura, intractable, without status migrainosus: Secondary | ICD-10-CM | POA: Diagnosis not present

## 2019-04-29 DIAGNOSIS — R0681 Apnea, not elsewhere classified: Secondary | ICD-10-CM

## 2019-04-29 DIAGNOSIS — R0683 Snoring: Secondary | ICD-10-CM | POA: Diagnosis not present

## 2019-04-29 NOTE — Telephone Encounter (Signed)
medcost order sent to GI. They will obtain the auth and reach out to the patient to schedule.  

## 2019-04-29 NOTE — Progress Notes (Signed)
Subjective:    Patient ID: Ruth King is a 18 y.o. female.  HPI     Huston FoleySaima Altovise Wahler, MD, PhD [facility] [address], [address] [address] [place], [place] [address]  Dear Dr. Nadine CountsGottschalk,  I saw your patient, Elam DutchSierra Malicki, upon your kind request in my neurologic clinic today for initial consultation of her recurrent headaches.  The patient is accompanied by her sister today. As you know, Ruth King is a 18 year old right-handed woman with an underlying medical history of allergies, asthma, and obesity, who reports recurrent migrainous headaches for the past at least 1 year. I reviewed your phone note from 03/28/2019.  She has been on Maxalt for abortive treatment and recently started on Ubrelvy, and for prevention she has been on Topamax.  Topamax was recently increased from 25 mg twice daily to 50 mg twice daily about 3 weeks ago.  She denies any side effects and feels like her medication regimen has helped.  She also endorsed stress recently.  She changed her jobs and had stress reduction she feels.  Her sister has had migrainous headaches.  They also have a brother, unclear if he has migraines.  Her maternal grandmother has sleep apnea. She has not taken any other preventative medications.  She denies any side effects with the topamax.  Of note, she does endorse daytime somnolence.  Her Epworth sleepiness score is 8 out of 24, fatigue severity score is 24 out of 63.  She endorses snoring.  She is single, no children, lives with parents and sister.  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  Her bedtime is generally between 9 and 11 PM.  She snores and has been noted to have pauses in her breathing.  She is a non-smoker and does not utilize alcohol or illicit drugs or caffeine on a daily basis.  She has a family history of migraines in her mother.  Her maternal uncle also has migraines.  She has one-sided headaches which are throbbing typically.  Her  headache frequency can be up to 3 or 4 times a week but lately less.  She has a visual aura in the form of black dots in front of her vision.  She can tell by the visual symptoms that she is about to have a migraine.  She has associated sound and light sensitivity.  She has had nausea but typically no vomiting, has taken nausea medication.  Her Past Medical History Is Significant For: Past Medical History:  Diagnosis Date  . Asthma   . Nausea & vomiting     Her Past Surgical History Is Significant For: Past Surgical History:  Procedure Laterality Date  . ADENOIDECTOMY    . APPENDECTOMY    . tubes in ears      Her Family History Is Significant For: Family History  Problem Relation Age of Onset  . Asthma Mother   . Gout Father   . Seizures Maternal Grandmother     Her Social History Is Significant For: Social History   Socioeconomic History  . Marital status: Single    Spouse name: Not on file  . Number of children: Not on file  . Years of education: Not on file  . Highest education level: Not on file  Occupational History  . Not on file  Social Needs  . Financial resource strain: Not on file  . Food insecurity    Worry: Not on file  Inability: Not on file  . Transportation needs    Medical: Not on file    Non-medical: Not on file  Tobacco Use  . Smoking status: Never Smoker  . Smokeless tobacco: Never Used  Substance and Sexual Activity  . Alcohol use: No  . Drug use: No  . Sexual activity: Not on file  Lifestyle  . Physical activity    Days per week: Not on file    Minutes per session: Not on file  . Stress: Not on file  Relationships  . Social Musicianconnections    Talks on phone: Not on file    Gets together: Not on file    Attends religious service: Not on file    Active member of club or organization: Not on file    Attends meetings of clubs or organizations: Not on file    Relationship status: Not on file  Other Topics Concern  . Not on file  Social  History Narrative   Lives at home with mom, dad and two sisters, attends [employer] is in the 10th grade.     Her Allergies Are:  Allergies  Allergen Reactions  . Bee Venom Shortness Of Breath  . Azithromycin Other (See Comments)    Not effective   . Cephalosporins Hives  :   Her Current Medications Are:  Outpatient Encounter Medications as of 04/29/2019  Medication Sig  . albuterol (PROVENTIL HFA;VENTOLIN HFA) 108 (90 Base) MCG/ACT inhaler Inhale 2 puffs into the lungs every 6 (six) hours as needed for wheezing or shortness of breath.  . cetirizine (ZYRTEC) 10 MG tablet Take 1 tablet (10 mg total) by mouth at bedtime.  . fluticasone (FLONASE) 50 MCG/ACT nasal spray Place 2 sprays into both nostrils daily. (Patient taking differently: Place 2 sprays into both nostrils daily as needed for allergies or rhinitis. )  . metFORMIN (GLUCOPHAGE) 500 MG tablet Take 1 tablet (500 mg total) by mouth daily with breakfast.  . montelukast (SINGULAIR) 10 MG tablet Take 1 tablet (10 mg total) by mouth at bedtime. (Needs to be seen before next refill)  . norethindrone-ethinyl estradiol-iron (LARIN FE 1.5/30) 1.5-30 MG-MCG tablet Take 1 tablet by mouth daily. Skip inactive pills x 2 mo, starting new pack immediately, then take for third month  . ondansetron (ZOFRAN) 4 MG tablet Take 1 tablet (4 mg total) by mouth every 8 (eight) hours as needed for nausea or vomiting.  . rizatriptan (MAXALT) 10 MG tablet May repeat in 2 hours if needed  . topiramate (TOPAMAX) 50 MG tablet Take 1 tablet (50 mg total) by mouth 2 (two) times daily.  Marland Kitchen. Ubrogepant (UBRELVY) 50 MG TABS Take 50 mg by mouth as directed. Take at onset of headache, may repeat after 2 hours if headache still present. Max 2 tabs per day.   No facility-administered encounter medications on file as of 04/29/2019.   :   Review of Systems:  Out of a complete 14 point review of systems, all are reviewed and negative with the exception of these  symptoms as listed below:  Review of Systems  Neurological:       Pt presents today to discuss her migraines. Pt has migraines 3-4 times per week and they can last all day. She has associated N/V, light and sound sensitivity. Pt has never had a sleep study but does endorse snoring.  Epworth Sleepiness Scale 0= would never doze 1= slight chance of dozing 2= moderate chance of dozing 3= high chance of dozing  Sitting and reading: 3 Watching TV: 0 Sitting inactive in a public place (ex. Theater or meeting): 0 As a passenger in a car for an hour without a break: 3 Lying down to rest in the afternoon: 2 Sitting and talking to someone: 0 Sitting quietly after lunch (no alcohol): 0 In a car, while stopped in traffic: 0 Total: 8     Objective:  Neurological Exam  Physical Exam Physical Examination:   Vitals:   04/29/19 1414  BP: 129/82  Pulse: 83    General Examination: The patient is a very pleasant 18 y.o. female in no acute distress. She appears well-developed and well-nourished and well groomed.   HEENT: Normocephalic, atraumatic, pupils are equal, round and reactive to light and accommodation. Funduscopic exam is normal with sharp disc margins noted. Extraocular tracking is good without limitation to gaze excursion or nystagmus noted. Normal smooth pursuit is noted. Hearing is grossly intact. Tympanic membranes are clear bilaterally. Face is symmetric with normal facial animation and normal facial sensation. Speech is clear with no dysarthria noted. There is no hypophonia. There is no lip, neck/head, jaw or voice tremor. Neck is supple with full range of passive and active motion. There are no carotid bruits on auscultation. Oropharynx exam reveals: mild mouth dryness, adequate dental hygiene and mild airway crowding, due to Small airway entry, tonsils of 1-2+, left side little easier to see than right.  Tongue protrudes centrally in palate elevates symmetrically.  Mallampati is  class II.  Neck circumference is 17 inches.   Heart: S1+S2+0, regular and normal without murmurs, rubs or gallops noted.   Abdomen: Soft, non-tender and non-distended with normal bowel sounds appreciated on auscultation.  Extremities: There is no pitting edema in the distal lower extremities bilaterally. Pedal pulses are intact.  Skin: Warm and dry without trophic changes noted.  Musculoskeletal: exam reveals no obvious joint deformities, tenderness or joint swelling or erythema.   Neurologically:  Mental status: The patient is awake, alert and oriented in all 4 spheres. Her immediate and remote memory, attention, language skills and fund of knowledge are appropriate. There is no evidence of aphasia, agnosia, apraxia or anomia. Speech is clear with normal prosody and enunciation. Thought process is linear. King is normal and affect is normal.  Cranial nerves II - XII are as described above under HEENT exam. In addition: shoulder shrug is normal with equal shoulder height noted. Motor exam: Normal bulk, strength and tone is noted. There is no drift, tremor or rebound. Romberg is negative. Reflexes are 2+ throughout. Babinski: Toes are flexor bilaterally. Fine motor skills and coordination: intact with normal finger taps, normal hand movements, normal rapid alternating patting, normal foot taps and normal foot agility.  Cerebellar testing: No dysmetria or intention tremor on finger to nose testing. Heel to shin is unremarkable bilaterally. There is no truncal or gait ataxia.  Sensory exam: intact to light touch in the upper and lower extremities.  Gait, station and balance: She stands easily. No veering to one side is noted. No leaning to one side is noted. Posture is age-appropriate and stance is narrow based. Gait shows normal stride length and pace. No problems turning are noted. tandem walk is unremarkable.  Assessment and Plan:  Assessment and Plan:  In summary, Ruth DixonSierra M Leitz is a very  pleasant 18 y.o.-year old female with an underlying medical history of allergies, asthma, and obesity, who Presents for evaluation of her headaches.  Her history is in keeping with migraines withAura.  She has been on preventative medication with Topamax which was recently increased to 50 mg twice daily.  There is room to increase it further if need be.  She denies any side effects, in particular, she denies any tingling or changes in taste.  She has been on abortive treatment in the form of rizatriptan and more recently in the form of Ubrelvy, Which is a reasonable approach.  She is advised to continue with her current medication regimen.  Diagnostic testing would include brain MRI as well as a sleep study to rule out underlying obstructive sleep apnea.  She does endorse snoring and also pauses in her breathing while asleep.  She would be willing to proceed with testing for diagnostic completion.  We will keep her posted as to her brain MRI result as well as sleep study.  If she has obstructive sleep apnea I will recommend treatment for this in the form of AutoPap or CPAP at home. We talked about common headache triggers today.  She has noticed stress as a trigger.  She is advised to stay well-hydrated with water.  I plan to see her back after testing.  I answered all their questions today and the patient and her sister were in agreement.  Thank you very much for allowing me to participate in the care of this nice patient. If I can be of any further assistance to you please do not hesitate to call me at [phone].  Sincerely,   Star Age, MD, PhD

## 2019-04-29 NOTE — Patient Instructions (Addendum)
Your migraine medications are appropriate, please continue with the Topiramate 50 mg twice daily, there is room to increase further if needed.  You can continue with the Ubrelvy and rizatriptan as needed.  Your neurological exam is normal which is reassuring.  Nevertheless, for diagnostic completion, I would recommend a brain MRI with and without contrast as well as a sleep study to rule out underlying obstructive sleep apnea.  Untreated obstructive sleep apnea can cause recurrent headaches and daytime sleepiness.  Please do not drive if he feels sleepy.  We will call you to schedule your brain scan and your sleep study.  We will do blood work today to ensure your kidney function is okay. I will see you back in about 3 months. If you have obstructive sleep apnea I will recommend treatment for this in the form of CPAP or AutoPap machine.  We will call you with your test results in the interim. Please remember, common headache triggers are: sleep deprivation, dehydration, overheating, stress, hypoglycemia or skipping meals and blood sugar fluctuations, excessive pain medications or excessive alcohol use or caffeine withdrawal. Some people have food triggers such as aged cheese, orange juice or chocolate, especially dark chocolate, or MSG (monosodium glutamate). Try to avoid these headache triggers as much possible. It may be helpful to keep a headache diary to figure out what makes your headaches worse or brings them on and what alleviates them. Some people report headache onset after exercise but studies have shown that regular exercise may actually prevent headaches from coming. If you have exercise-induced headaches, please make sure that you drink plenty of fluid before and after exercising and that you do not over do it and do not overheat.

## 2019-04-30 ENCOUNTER — Telehealth: Payer: Self-pay

## 2019-04-30 LAB — COMPREHENSIVE METABOLIC PANEL
ALT: 22 IU/L (ref 0–32)
AST: 13 IU/L (ref 0–40)
Albumin/Globulin Ratio: 2.3 — ABNORMAL HIGH (ref 1.2–2.2)
Albumin: 4.4 g/dL (ref 3.9–5.0)
Alkaline Phosphatase: 67 IU/L (ref 39–117)
BUN/Creatinine Ratio: 13 (ref 9–23)
BUN: 9 mg/dL (ref 6–20)
Bilirubin Total: <0.2 mg/dL (ref 0.0–1.2)
CO2: 20 mmol/L (ref 20–29)
Calcium: 9.5 mg/dL (ref 8.7–10.2)
Chloride: 105 mmol/L (ref 96–106)
Creatinine, Ser: 0.69 mg/dL (ref 0.57–1.00)
GFR calc Af Amer: 146 mL/min/{1.73_m2} (ref >59)
GFR calc non Af Amer: 127 mL/min/{1.73_m2} (ref >59)
Globulin, Total: 1.9 g/dL (ref 1.5–4.5)
Glucose: 89 mg/dL (ref 65–99)
Potassium: 4.2 mmol/L (ref 3.5–5.2)
Sodium: 141 mmol/L (ref 134–144)
Total Protein: 6.3 g/dL (ref 6.0–8.5)

## 2019-04-30 NOTE — Progress Notes (Signed)
Electrolytes, kidney and liver function are good. Please update [name]

## 2019-04-30 NOTE — Telephone Encounter (Signed)
-----   Message from Star Age, MD sent at 04/30/2019  7:42 AM EDT ----- Electrolytes, kidney and liver function are good. Please update [name]

## 2019-04-30 NOTE — Telephone Encounter (Signed)
I called pt to discuss her labs. No answer, left a message asking her to call me back. 

## 2019-05-01 NOTE — Telephone Encounter (Signed)
I called pt again, no answer, VM is not set up. Will send a mychart message.

## 2019-05-12 ENCOUNTER — Other Ambulatory Visit: Payer: Self-pay | Admitting: Family Medicine

## 2019-05-12 DIAGNOSIS — G43809 Other migraine, not intractable, without status migrainosus: Secondary | ICD-10-CM

## 2019-05-14 ENCOUNTER — Telehealth: Payer: Self-pay

## 2019-05-14 NOTE — Telephone Encounter (Signed)
We have attempted to call the patient two times to schedule sleep study.  Patient has been unavailable at the phone numbers we have on file and has not returned our calls. If patient calls back we will schedule them for their sleep study.  

## 2019-05-27 ENCOUNTER — Ambulatory Visit (INDEPENDENT_AMBULATORY_CARE_PROVIDER_SITE_OTHER): Payer: PRIVATE HEALTH INSURANCE | Admitting: Nurse Practitioner

## 2019-05-27 ENCOUNTER — Other Ambulatory Visit: Payer: Self-pay

## 2019-05-27 ENCOUNTER — Encounter: Payer: Self-pay | Admitting: Nurse Practitioner

## 2019-05-27 DIAGNOSIS — G43109 Migraine with aura, not intractable, without status migrainosus: Secondary | ICD-10-CM

## 2019-05-27 NOTE — Progress Notes (Signed)
   Virtual Visit via telephone Note Due to COVID-19 pandemic this visit was conducted virtually. This visit type was conducted due to national recommendations for restrictions regarding the COVID-19 Pandemic (e.g. social distancing, sheltering in place) in an effort to limit this patient's exposure and mitigate transmission in our community. All issues noted in this document were discussed and addressed.  A physical exam was not performed with this format.  I connected with Ruth King on 05/27/19 at 10:30 by telephone and verified that I am speaking with the correct person using two identifiers. Ruth King is currently located at home and no one is currently with her during visit. The provider, Mary-Margaret Hassell Done, FNP is located in their office at time of visit.  I discussed the limitations, risks, security and privacy concerns of performing an evaluation and management service by telephone and the availability of in person appointments. I also discussed with the patient that there may be a patient responsible charge related to this service. The patient expressed understanding and agreed to proceed.   History and Present Illness:   Chief Complaint: Headache   HPI Patient contacted to discuss headaches. She has a history of migraine and is on topamax and maxalt. This last headache started this morning around 1am. She has taken a maxalt, which has helped some. Her headaches will usually last for 10 hours when sh ehas one. She rates pain 9/10 today which is no different then what she normally has. These occur 1-4x in a month. Have gotten better since she has a new job.     Review of Systems  Constitutional: Negative for diaphoresis and weight loss.  Eyes: Negative for blurred vision, double vision and pain.  Respiratory: Negative for shortness of breath.   Cardiovascular: Negative for chest pain, palpitations, orthopnea and leg swelling.  Gastrointestinal: Negative for abdominal  pain.  Skin: Negative for rash.  Neurological: Negative for dizziness, sensory change, loss of consciousness, weakness and headaches.  Endo/Heme/Allergies: Negative for polydipsia. Does not bruise/bleed easily.  Psychiatric/Behavioral: Negative for memory loss. The patient does not have insomnia.   All other systems reviewed and are negative.    Observations/Objective: Alert and oriented- answers all questions appropriately Moderate distress  Assessment and Plan: Ruth King in today with chief complaint of Headache   1. Migraine with aura and without status migrainosus, not intractable Take zofran Moist compress to forehead Try to sleep off.  Work not esent to my chart Discussed getting FMLA papers filled out    Follow Up Instructions: prn    I discussed the assessment and treatment plan with the patient. The patient was provided an opportunity to ask questions and all were answered. The patient agreed with the plan and demonstrated an understanding of the instructions.   The patient was advised to call back or seek an in-person evaluation if the symptoms worsen or if the condition fails to improve as anticipated.  The above assessment and management plan was discussed with the patient. The patient verbalized understanding of and has agreed to the management plan. Patient is aware to call the clinic if symptoms persist or worsen. Patient is aware when to return to the clinic for a follow-up visit. Patient educated on when it is appropriate to go to the emergency department.   Time call ended:  10:40 I provided 10 minutes of non-face-to-face time during this encounter.    Mary-Margaret Hassell Done, FNP

## 2019-06-03 ENCOUNTER — Other Ambulatory Visit: Payer: PRIVATE HEALTH INSURANCE

## 2019-06-04 ENCOUNTER — Encounter: Payer: Self-pay | Admitting: Family Medicine

## 2019-06-04 ENCOUNTER — Other Ambulatory Visit: Payer: Self-pay

## 2019-06-04 ENCOUNTER — Ambulatory Visit (INDEPENDENT_AMBULATORY_CARE_PROVIDER_SITE_OTHER): Payer: PRIVATE HEALTH INSURANCE | Admitting: Family Medicine

## 2019-06-04 DIAGNOSIS — G43009 Migraine without aura, not intractable, without status migrainosus: Secondary | ICD-10-CM

## 2019-06-04 DIAGNOSIS — R110 Nausea: Secondary | ICD-10-CM | POA: Diagnosis not present

## 2019-06-04 NOTE — Progress Notes (Signed)
Virtual Visit via telephone Note Due to COVID-19 pandemic this visit was conducted virtually. This visit type was conducted due to national recommendations for restrictions regarding the COVID-19 Pandemic (e.g. social distancing, sheltering in place) in an effort to limit this patient's exposure and mitigate transmission in our community. All issues noted in this document were discussed and addressed.  A physical exam was not performed with this format.   I connected with [name] on 06/04/19 at 1220 by telephone and verified that I am speaking with the correct person using two identifiers. Raoul Pitch DONNEL STACHOWICZ is currently located at hme and family is currently with them during visit. The provider, Kari Baars, FNP is located in their office at time of visit.  I discussed the limitations, risks, security and privacy concerns of performing an evaluation and management service by telephone and the availability of in person appointments. I also discussed with the patient that there may be a patient responsible charge related to this service. The patient expressed understanding and agreed to proceed.  Subjective:  Patient ID: Ruth King, female    DOB: [birth year], 18 y.o.   MRN: [id number]  Chief Complaint:  Headache   HPI: Ruth King is a 18 y.o. female presenting on 06/04/2019 for Headache   Pt states she woke up this morning with a migraine headache and nausea. Pt states she was unable to go to work today due to the headache. States she has taken all of her medications and the headache has almost resolved. States she still has slight nausea. States she has taken zofran with some relief. She has a MRI ordered and follow up with neurology.   Headache  This is a chronic problem. The current episode started today. The problem has been gradually improving. The pain is located in the occipital region. The pain quality is similar to prior headaches. The quality of the pain is described as  throbbing and aching. The pain is at a severity of 2/10. The pain is mild. Associated symptoms include nausea, photophobia and vomiting. Pertinent negatives include no abdominal pain, coughing, dizziness, fever, numbness, seizures or weakness. The symptoms are aggravated by bright light and noise. She has tried triptans and beta blockers for the symptoms. The treatment provided significant relief. Her past medical history is significant for migraine headaches.     Relevant past medical, surgical, family, and social history reviewed and updated as indicated.  Allergies and medications reviewed and updated.   Past Medical History:  Diagnosis Date  . Asthma   . Nausea & vomiting     Past Surgical History:  Procedure Laterality Date  . ADENOIDECTOMY    . APPENDECTOMY    . tubes in ears      Social History   Socioeconomic History  . Marital status: Single    Spouse name: Not on file  . Number of children: Not on file  . Years of education: Not on file  . Highest education level: Not on file  Occupational History  . Not on file  Social Needs  . Financial resource strain: Not on file  . Food insecurity    Worry: Not on file    Inability: Not on file  . Transportation needs    Medical: Not on file    Non-medical: Not on file  Tobacco Use  . Smoking status: Never Smoker  . Smokeless tobacco: Never Used  Substance and Sexual Activity  . Alcohol use: No  . Drug use: No  .  Sexual activity: Not on file  Lifestyle  . Physical activity    Days per week: Not on file    Minutes per session: Not on file  . Stress: Not on file  Relationships  . Social Musicianconnections    Talks on phone: Not on file    Gets together: Not on file    Attends religious service: Not on file    Active member of club or organization: Not on file    Attends meetings of clubs or organizations: Not on file    Relationship status: Not on file  . Intimate partner violence    Fear of current or ex partner: Not  on file    Emotionally abused: Not on file    Physically abused: Not on file    Forced sexual activity: Not on file  Other Topics Concern  . Not on file  Social History Narrative   Lives at home with mom, dad and two sisters, attends [employer] is in the 10th grade.     Outpatient Encounter Medications as of 06/04/2019  Medication Sig  . albuterol (PROVENTIL HFA;VENTOLIN HFA) 108 (90 Base) MCG/ACT inhaler Inhale 2 puffs into the lungs every 6 (six) hours as needed for wheezing or shortness of breath.  . cetirizine (ZYRTEC) 10 MG tablet Take 1 tablet (10 mg total) by mouth at bedtime.  . fluticasone (FLONASE) 50 MCG/ACT nasal spray Place 2 sprays into both nostrils daily. (Patient taking differently: Place 2 sprays into both nostrils daily as needed for allergies or rhinitis. )  . metFORMIN (GLUCOPHAGE) 500 MG tablet Take 1 tablet (500 mg total) by mouth daily with breakfast.  . montelukast (SINGULAIR) 10 MG tablet Take 1 tablet (10 mg total) by mouth at bedtime. (Needs to be seen before next refill)  . norethindrone-ethinyl estradiol-iron (LARIN FE 1.5/30) 1.5-30 MG-MCG tablet Take 1 tablet by mouth daily. Skip inactive pills x 2 mo, starting new pack immediately, then take for third month  . ondansetron (ZOFRAN) 4 MG tablet Take 1 tablet (4 mg total) by mouth every 8 (eight) hours as needed for nausea or vomiting.  . rizatriptan (MAXALT) 10 MG tablet May repeat in 2 hours if needed  . topiramate (TOPAMAX) 50 MG tablet Take 1 tablet (50 mg total) by mouth 2 (two) times daily.  Marland Kitchen. Ubrogepant (UBRELVY) 50 MG TABS Take 50 mg by mouth as directed. Take at onset of headache, may repeat after 2 hours if headache still present. Max 2 tabs per day.   No facility-administered encounter medications on file as of 06/04/2019.     Allergies  Allergen Reactions  . Bee Venom Shortness Of Breath  . Azithromycin Other (See Comments)    Not effective   . Cephalosporins Hives    Review of Systems   Constitutional: Negative for activity change, appetite change, chills, diaphoresis, fatigue, fever and unexpected weight change.  Eyes: Positive for photophobia. Negative for visual disturbance.  Respiratory: Negative for cough and shortness of breath.   Cardiovascular: Negative for chest pain and palpitations.  Gastrointestinal: Positive for nausea and vomiting. Negative for abdominal distention, abdominal pain, anal bleeding, blood in stool, constipation, diarrhea and rectal pain.  Neurological: Positive for headaches. Negative for dizziness, tremors, seizures, syncope, facial asymmetry, speech difficulty, weakness, light-headedness and numbness.  Psychiatric/Behavioral: Negative for confusion.  All other systems reviewed and are negative.        Observations/Objective: No vital signs or physical exam, this was a telephone or virtual health encounter.  Pt  alert and oriented, answers all questions appropriately, and able to speak in full sentences.    Assessment and Plan: Harlynn was seen today for headache.  Diagnoses and all orders for this visit:  Migraine without aura and without status migrainosus, not intractable Nausea in adult Headache has almost resolved. Pt needs work note for today. Symptomatic care discussed. Medications as prescribed. Have MRI as ordered and follow up with neurology as scheduled. Report any new or worsening symptoms.     Follow Up Instructions: Return if symptoms worsen or fail to improve.    I discussed the assessment and treatment plan with the patient. The patient was provided an opportunity to ask questions and all were answered. The patient agreed with the plan and demonstrated an understanding of the instructions.   The patient was advised to call back or seek an in-person evaluation if the symptoms worsen or if the condition fails to improve as anticipated.  The above assessment and management plan was discussed with the patient. The patient  verbalized understanding of and has agreed to the management plan. Patient is aware to call the clinic if symptoms persist or worsen. Patient is aware when to return to the clinic for a follow-up visit. Patient educated on when it is appropriate to go to the emergency department.    I provided 10 minutes of non-face-to-face time during this encounter. The call started at 1220. The call ended at 1230. The other time was used for coordination of care.    Monia Pouch, FNP-C [facility] [address] [place], [place] [id number] [phone] 06/04/19

## 2019-06-06 ENCOUNTER — Other Ambulatory Visit: Payer: Self-pay | Admitting: Family Medicine

## 2019-06-06 DIAGNOSIS — E119 Type 2 diabetes mellitus without complications: Secondary | ICD-10-CM

## 2019-06-13 ENCOUNTER — Ambulatory Visit (INDEPENDENT_AMBULATORY_CARE_PROVIDER_SITE_OTHER): Payer: PRIVATE HEALTH INSURANCE | Admitting: Family Medicine

## 2019-06-13 ENCOUNTER — Other Ambulatory Visit: Payer: Self-pay

## 2019-06-13 ENCOUNTER — Encounter: Payer: Self-pay | Admitting: Family Medicine

## 2019-06-13 DIAGNOSIS — G43009 Migraine without aura, not intractable, without status migrainosus: Secondary | ICD-10-CM | POA: Diagnosis not present

## 2019-06-13 NOTE — Progress Notes (Signed)
Virtual Visit via telephone Note  I connected with Ruth King on 06/13/19 at 0857 by telephone and verified that I am speaking with the correct person using two identifiers. Ruth King is currently located at home and no other people are currently with her during visit. The provider, Fransisca Kaufmann Dettinger, MD is located in their office at time of visit.  Call ended at [phone]  I discussed the limitations, risks, security and privacy concerns of performing an evaluation and management service by telephone and the availability of in person appointments. I also discussed with the patient that there may be a patient responsible charge related to this service. The patient expressed understanding and agreed to proceed.   History and Present Illness: Patient is calling in with a complaint of migraine headache that started yesterday when she awoke.  It was in the frontal region on both sides. She is having trouble with lights and sounds and she used ubrelvy and it did help for a few hours. Her headache is much improved today. She is calling in for a not for work.  No diagnosis found.  Outpatient Encounter Medications as of 06/13/2019  Medication Sig  . albuterol (PROVENTIL HFA;VENTOLIN HFA) 108 (90 Base) MCG/ACT inhaler Inhale 2 puffs into the lungs every 6 (six) hours as needed for wheezing or shortness of breath.  . cetirizine (ZYRTEC) 10 MG tablet Take 1 tablet (10 mg total) by mouth at bedtime.  . fluticasone (FLONASE) 50 MCG/ACT nasal spray Place 2 sprays into both nostrils daily. (Patient taking differently: Place 2 sprays into both nostrils daily as needed for allergies or rhinitis. )  . metFORMIN (GLUCOPHAGE) 500 MG tablet Take 1 tablet (500 mg total) by mouth daily with breakfast. (Needs to be seen before next refill)  . montelukast (SINGULAIR) 10 MG tablet Take 1 tablet (10 mg total) by mouth at bedtime. (Needs to be seen before next refill)  . norethindrone-ethinyl estradiol-iron  (LARIN FE 1.5/30) 1.5-30 MG-MCG tablet Take 1 tablet by mouth daily. Skip inactive pills x 2 mo, starting new pack immediately, then take for third month  . ondansetron (ZOFRAN) 4 MG tablet Take 1 tablet (4 mg total) by mouth every 8 (eight) hours as needed for nausea or vomiting.  . rizatriptan (MAXALT) 10 MG tablet May repeat in 2 hours if needed  . topiramate (TOPAMAX) 50 MG tablet Take 1 tablet (50 mg total) by mouth 2 (two) times daily.  Marland Kitchen Ubrogepant (UBRELVY) 50 MG TABS Take 50 mg by mouth as directed. Take at onset of headache, may repeat after 2 hours if headache still present. Max 2 tabs per day.   No facility-administered encounter medications on file as of 06/13/2019.     Review of Systems  Constitutional: Negative for chills and fever.  Eyes: Positive for photophobia. Negative for visual disturbance.  Respiratory: Negative for chest tightness and shortness of breath.   Cardiovascular: Negative for chest pain and leg swelling.  Neurological: Positive for headaches. Negative for dizziness, light-headedness and numbness.  All other systems reviewed and are negative.   Observations/Objective: Patient sounds comfortable and in no acute distress  Assessment and Plan: Problem List Items Addressed This Visit      Cardiovascular and Mediastinum   Migraine headache - Primary      She used her medications which she has plenty of and she is feeling a lot better. Follow Up Instructions:  Follow-up in November for her usual checkup   I discussed the assessment and  treatment plan with the patient. The patient was provided an opportunity to ask questions and all were answered. The patient agreed with the plan and demonstrated an understanding of the instructions.   The patient was advised to call back or seek an in-person evaluation if the symptoms worsen or if the condition fails to improve as anticipated.  The above assessment and management plan was discussed with the patient. The  patient verbalized understanding of and has agreed to the management plan. Patient is aware to call the clinic if symptoms persist or worsen. Patient is aware when to return to the clinic for a follow-up visit. Patient educated on when it is appropriate to go to the emergency department.    I provided 7 minutes of non-face-to-face time during this encounter.    Nils PyleJoshua A Dettinger, MD

## 2019-06-18 ENCOUNTER — Ambulatory Visit (INDEPENDENT_AMBULATORY_CARE_PROVIDER_SITE_OTHER): Payer: PRIVATE HEALTH INSURANCE | Admitting: Family Medicine

## 2019-06-18 ENCOUNTER — Encounter: Payer: Self-pay | Admitting: Family Medicine

## 2019-06-18 ENCOUNTER — Encounter: Payer: Self-pay | Admitting: *Deleted

## 2019-06-18 DIAGNOSIS — G43019 Migraine without aura, intractable, without status migrainosus: Secondary | ICD-10-CM | POA: Diagnosis not present

## 2019-06-18 MED ORDER — BUTALBITAL-APAP-CAFFEINE 50-300-40 MG PO CAPS: 1.0000 | ORAL_CAPSULE | ORAL | 0 refills | Status: DC | PRN

## 2019-06-18 NOTE — Progress Notes (Signed)
Subjective:    Patient ID: Ruth King, female    DOB: 2001-07-27, 18 y.o.   MRN: [id number]   HPI: Ruth King is a 18 y.o. female presenting for migraines over the years. Dx as chronic migraine. Triggered by stress. Very stressful day yesterday.  Severe pain earlier today has eased  Off from 10/10 this AM. Now 4-5/10.   Depression screen [facility] 2/9 01/24/2019 11/16/2018 11/05/2018 09/28/2018 08/20/2018  Decreased Interest 0 0 0 0 0  Down, Depressed, Hopeless 0 0 0 0 0  PHQ - 2 Score 0 0 0 0 0  Altered sleeping - - - - -  Tired, decreased energy - - - - -  Change in appetite - - - - -  Feeling bad or failure about yourself  - - - - -  Trouble concentrating - - - - -  Moving slowly or fidgety/restless - - - - -  Suicidal thoughts - - - - -  PHQ-9 Score - - - - -     Relevant past medical, surgical, family and social history reviewed and updated as indicated.  Interim medical history since our last visit reviewed. Allergies and medications reviewed and updated.  ROS:  Review of Systems  Constitutional: Negative.   HENT: Negative.   Eyes: Negative for visual disturbance.  Respiratory: Negative for shortness of breath.   Cardiovascular: Negative for chest pain.  Gastrointestinal: Negative for abdominal pain.  Musculoskeletal: Negative for arthralgias.  Neurological: Positive for headaches. Negative for dizziness, seizures, syncope, speech difficulty, weakness, light-headedness and numbness.  Psychiatric/Behavioral: Positive for decreased concentration. Negative for confusion and hallucinations.     Social History   Tobacco Use  Smoking Status Never Smoker  Smokeless Tobacco Never Used       Objective:     Wt Readings from Last 3 Encounters:  04/29/19 242 lb (109.8 kg) (>99 %, Z= 2.44)*  02/01/19 247 lb (112 kg) (>99 %, Z= 2.47)*  11/16/18 264 lb 9.6 oz (120 kg) (>99 %, Z= 2.60)*   * Growth percentiles are based on CDC (Girls, 2-20 Years) data.     Exam  deferred. Pt. Harboring due to COVID 19. Phone visit performed.   Assessment & Plan:   1. Intractable migraine without aura and without status migrainosus     Meds ordered this encounter  Medications  . Butalbital-APAP-Caffeine (FIORICET) 50-300-40 MG CAPS    Sig: Take 1 capsule by mouth every 4 (four) hours as needed (headache).    Dispense:  20 capsule    Refill:  0    No orders of the defined types were placed in this encounter.     Diagnoses and all orders for this visit:  Intractable migraine without aura and without status migrainosus  Other orders -     Butalbital-APAP-Caffeine (FIORICET) 50-300-40 MG CAPS; Take 1 capsule by mouth every 4 (four) hours as needed (headache).    Virtual Visit via telephone Note  I discussed the limitations, risks, security and privacy concerns of performing an evaluation and management service by telephone and the availability of in person appointments. The patient was identified with two identifiers. Pt.expressed understanding and agreed to proceed. Pt. Is at home. Dr. Livia Snellen is in his office.  Follow Up Instructions:   I discussed the assessment and treatment plan with the patient. The patient was provided an opportunity to ask questions and all were answered. The patient agreed with the plan and demonstrated an understanding of the instructions.  The patient was advised to call back or seek an in-person evaluation if the symptoms worsen or if the condition fails to improve as anticipated.   Total minutes including chart review and phone contact time: 14   Follow up plan: Return if symptoms worsen or fail to improve.  Claretta Fraise, MD [place]

## 2019-07-03 ENCOUNTER — Other Ambulatory Visit: Payer: Self-pay

## 2019-07-03 DIAGNOSIS — Z3202 Encounter for pregnancy test, result negative: Secondary | ICD-10-CM | POA: Insufficient documentation

## 2019-07-03 DIAGNOSIS — Z6831 Body mass index (BMI) 31.0-31.9, adult: Secondary | ICD-10-CM | POA: Diagnosis not present

## 2019-07-03 DIAGNOSIS — E119 Type 2 diabetes mellitus without complications: Secondary | ICD-10-CM | POA: Insufficient documentation

## 2019-07-03 DIAGNOSIS — J45909 Unspecified asthma, uncomplicated: Secondary | ICD-10-CM | POA: Insufficient documentation

## 2019-07-03 DIAGNOSIS — Z7984 Long term (current) use of oral hypoglycemic drugs: Secondary | ICD-10-CM | POA: Insufficient documentation

## 2019-07-03 DIAGNOSIS — R112 Nausea with vomiting, unspecified: Secondary | ICD-10-CM | POA: Diagnosis present

## 2019-07-03 DIAGNOSIS — E860 Dehydration: Secondary | ICD-10-CM | POA: Diagnosis not present

## 2019-07-03 DIAGNOSIS — Z79899 Other long term (current) drug therapy: Secondary | ICD-10-CM | POA: Insufficient documentation

## 2019-07-03 DIAGNOSIS — E669 Obesity, unspecified: Secondary | ICD-10-CM | POA: Diagnosis not present

## 2019-07-03 MED ORDER — SODIUM CHLORIDE 0.9% FLUSH
3.0000 mL | Freq: Once | INTRAVENOUS | Status: AC
  Administered 2019-07-04: 02:00:00 3 mL via INTRAVENOUS

## 2019-07-03 NOTE — ED Triage Notes (Signed)
Pt started having diarrhea yesterday after eating at [facility] and states today she started vomiting; pt is unable to keep anything down including zofran and immodium

## 2019-07-04 ENCOUNTER — Emergency Department (HOSPITAL_COMMUNITY)
Admission: EM | Admit: 2019-07-04 | Discharge: 2019-07-04 | Disposition: A | Discharge status: Home / Self Care | Payer: PRIVATE HEALTH INSURANCE | Attending: Emergency Medicine | Admitting: Emergency Medicine

## 2019-07-04 ENCOUNTER — Inpatient Hospital Stay (HOSPITAL_COMMUNITY): Admit: 2019-07-04 | Payer: PRIVATE HEALTH INSURANCE

## 2019-07-04 DIAGNOSIS — R112 Nausea with vomiting, unspecified: Secondary | ICD-10-CM

## 2019-07-04 DIAGNOSIS — R197 Diarrhea, unspecified: Secondary | ICD-10-CM

## 2019-07-04 DIAGNOSIS — E860 Dehydration: Secondary | ICD-10-CM

## 2019-07-04 LAB — CBC
HCT: 45.8 % (ref 36.0–46.0)
Hemoglobin: 14.5 g/dL (ref 12.0–15.0)
MCH: 28.4 pg (ref 26.0–34.0)
MCHC: 31.7 g/dL (ref 30.0–36.0)
MCV: 89.6 fL (ref 80.0–100.0)
Platelets: 510 10*3/uL — ABNORMAL HIGH (ref 150–400)
RBC: 5.11 MIL/uL (ref 3.87–5.11)
RDW: 13.9 % (ref 11.5–15.5)
WBC: 25.7 10*3/uL — ABNORMAL HIGH (ref 4.0–10.5)
nRBC: 0 % (ref 0.0–0.2)

## 2019-07-04 LAB — COMPREHENSIVE METABOLIC PANEL
ALT: 21 U/L (ref 0–44)
AST: 15 U/L (ref 15–41)
Albumin: 4 g/dL (ref 3.5–5.0)
Alkaline Phosphatase: 76 U/L (ref 38–126)
Anion gap: 9 (ref 5–15)
BUN: 12 mg/dL (ref 6–20)
CO2: 19 mmol/L — ABNORMAL LOW (ref 22–32)
Calcium: 9.3 mg/dL (ref 8.9–10.3)
Chloride: 112 mmol/L — ABNORMAL HIGH (ref 98–111)
Creatinine, Ser: 0.59 mg/dL (ref 0.44–1.00)
GFR calc Af Amer: >60 mL/min (ref >60)
GFR calc non Af Amer: >60 mL/min (ref >60)
Glucose, Bld: 119 mg/dL — ABNORMAL HIGH (ref 70–99)
Potassium: 4.1 mmol/L (ref 3.5–5.1)
Sodium: 140 mmol/L (ref 135–145)
Total Bilirubin: 0.4 mg/dL (ref 0.3–1.2)
Total Protein: 7.5 g/dL (ref 6.5–8.1)

## 2019-07-04 LAB — URINALYSIS, ROUTINE W REFLEX MICROSCOPIC
Bacteria, UA: NONE SEEN
Bilirubin Urine: NEGATIVE
Glucose, UA: NEGATIVE mg/dL
Hgb urine dipstick: NEGATIVE
Ketones, ur: NEGATIVE mg/dL
Leukocytes,Ua: NEGATIVE
Nitrite: NEGATIVE
Protein, ur: 30 mg/dL — AB
Specific Gravity, Urine: 1.026 (ref 1.005–1.030)
pH: 5 (ref 5.0–8.0)

## 2019-07-04 LAB — LIPASE, BLOOD: Lipase: 26 U/L (ref 11–51)

## 2019-07-04 LAB — POC URINE PREG, ED: Preg Test, Ur: NEGATIVE

## 2019-07-04 MED ORDER — SODIUM CHLORIDE 0.9 % IV BOLUS
1000.0000 mL | Freq: Once | INTRAVENOUS | Status: AC
  Administered 2019-07-04: 1000 mL via INTRAVENOUS

## 2019-07-04 MED ORDER — FAMOTIDINE IN NACL 20-0.9 MG/50ML-% IV SOLN
20.0000 mg | Freq: Once | INTRAVENOUS | Status: AC
  Administered 2019-07-04: 20 mg via INTRAVENOUS
  Filled 2019-07-04: qty 50

## 2019-07-04 MED ORDER — ONDANSETRON HCL 4 MG PO TABS: 4.0000 mg | ORAL_TABLET | Freq: Three times a day (TID) | ORAL | 0 refills | Status: DC | PRN

## 2019-07-04 MED ORDER — ONDANSETRON HCL 4 MG/2ML IJ SOLN
4.0000 mg | Freq: Once | INTRAMUSCULAR | Status: AC
  Administered 2019-07-04: 4 mg via INTRAVENOUS
  Filled 2019-07-04: qty 2

## 2019-07-04 MED ORDER — SODIUM CHLORIDE 0.9 % IV BOLUS
1000.0000 mL | Freq: Once | INTRAVENOUS | Status: AC
  Administered 2019-07-04: 02:00:00 1000 mL via INTRAVENOUS

## 2019-07-04 NOTE — Discharge Instructions (Addendum)
Drink plenty of fluids (clear liquids) then start a bland diet later this morning such as toast, crackers, jello, [employer] chicken noodle soup. Use the zofran for nausea or vomiting. Take imodium OTC for diarrhea. Avoid milk products until the diarrhea is gone. ° °

## 2019-07-04 NOTE — ED Notes (Signed)
Pt states she feels well, does not have any nausea after drinking ginger ale

## 2019-07-04 NOTE — ED Provider Notes (Signed)
[facility] EMERGENCY DEPARTMENT Provider Note   CSN: [id number] Arrival date & time: 07/03/19  2136   Time seen 1:35 AM  History   Chief Complaint Chief Complaint  Patient presents with  . Emesis    HPI Ruth King is a 18 y.o. female.     HPI patient states yesterday afternoon she started having watery diarrhea and states she had somewhere between 10-20 episodes of nonbloody diarrhea.  She also states she has been having vomiting and nausea and is vomited somewhere between 10-20 times again without blood.  She states about the third time she vomited she did have some acid burning in her throat.  She states she has some upper abdominal pain that she describes as sharp that comes and goes and last about 10 to 15 minutes.  She states after she has vomiting or diarrhea it gets better but then it returns.  Nothing she does makes it hurt more.  She states she feels lightheaded.  She denies being around anybody who is sick.  She denies eating anything that could have made her sick although she did state to triage she had had eaten at [facility].  When asked why she is on metformin her mother states she was borderline diabetic and they are thinking about taking her off of the metformin soon.   PCP Dettinger, Elige Radon, MD   Past Medical History:  Diagnosis Date  . Asthma   . Nausea & vomiting     Patient Active Problem List   Diagnosis Date Noted  . Depression, recurrent (HCC) 03/11/2019  . Migraine headache 01/24/2019  . Diabetes mellitus without complication (HCC) 11/16/2018  . 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 Asthma, chronic 02/25/2016  . PCOS (polycystic ovarian syndrome) 09/23/2015  . Development delay 09/23/2015  . Learning disability 09/23/2015  . Nausea & vomiting     Past Surgical History:  Procedure Laterality Date  . ADENOIDECTOMY    . APPENDECTOMY    . tubes in ears       OB History   No obstetric history on file.      Home Medications     Prior to Admission medications   Medication Sig Start Date End Date Taking? Authorizing Provider  albuterol (PROVENTIL HFA;VENTOLIN HFA) 108 (90 Base) MCG/ACT inhaler Inhale 2 puffs into the lungs every 6 (six) hours as needed for wheezing or shortness of breath. 10/08/18   Johna Sheriff, MD  Butalbital-APAP-Caffeine (FIORICET) 50-300-40 MG CAPS Take 1 capsule by mouth every 4 (four) hours as needed (headache). 06/18/19   Mechele Claude, MD  cetirizine (ZYRTEC) 10 MG tablet Take 1 tablet (10 mg total) by mouth at bedtime. 07/16/18   Johna Sheriff, MD  fluticasone (FLONASE) 50 MCG/ACT nasal spray Place 2 sprays into both nostrils daily. Patient taking differently: Place 2 sprays into both nostrils daily as needed for allergies or rhinitis.  05/26/16   Dettinger, Elige Radon, MD  metFORMIN (GLUCOPHAGE) 500 MG tablet Take 1 tablet (500 mg total) by mouth daily with breakfast. (Needs to be seen before next refill) 06/06/19   Dettinger, Elige Radon, MD  montelukast (SINGULAIR) 10 MG tablet Take 1 tablet (10 mg total) by mouth at bedtime. (Needs to be seen before next refill) 07/16/18   Johna Sheriff, MD  norethindrone-ethinyl estradiol-iron Elmarie Shiley FE 1.5/30) 1.5-30 MG-MCG tablet Take 1 tablet by mouth daily. Skip inactive pills x 2 mo, starting new pack immediately, then take for third month 08/20/18   Oswaldo Done,  Berlin Hun, MD  ondansetron (ZOFRAN) 4 MG tablet Take 1 tablet (4 mg total) by mouth every 8 (eight) hours as needed. 07/04/19   Rolland Porter, MD  rizatriptan (MAXALT) 10 MG tablet May repeat in 2 hours if needed 04/03/19   Dettinger, Fransisca Kaufmann, MD  topiramate (TOPAMAX) 50 MG tablet Take 1 tablet (50 mg total) by mouth 2 (two) times daily. 04/03/19   Dettinger, Fransisca Kaufmann, MD  Ubrogepant (UBRELVY) 50 MG TABS Take 50 mg by mouth as directed. Take at onset of headache, may repeat after 2 hours if headache still present. Max 2 tabs per day. 04/03/19   Dettinger, Fransisca Kaufmann, MD    Family History Family History   Problem Relation Age of Onset  . Asthma Mother   . Gout Father   . Seizures Maternal Grandmother     Social History Social History   Tobacco Use  . Smoking status: Never Smoker  . Smokeless tobacco: Never Used  Substance Use Topics  . Alcohol use: No  . Drug use: No     Allergies   Bee venom, Azithromycin, and Cephalosporins   Review of Systems Review of Systems  All other systems reviewed and are negative.    Physical Exam Updated Vital Signs BP 125/72 (BP Location: Right Arm)   Pulse 90   Temp 98.8 F (37.1 C) (Oral)   Resp 17   Ht 6' (1.829 m)   Wt 104.3 kg   LMP 06/06/2019   SpO2 100%   BMI 31.19 kg/m   Physical Exam Vitals signs and nursing note reviewed.  Constitutional:      General: She is not in acute distress.    Appearance: Normal appearance. She is well-developed. She is obese. She is not ill-appearing or toxic-appearing.  HENT:     Head: Normocephalic and atraumatic.     Right Ear: External ear normal.     Left Ear: External ear normal.     Nose: Nose normal. No mucosal edema or rhinorrhea.     Mouth/Throat:     Mouth: Mucous membranes are dry.     Dentition: No dental abscesses.     Pharynx: Oropharynx is clear. No uvula swelling.  Eyes:     Extraocular Movements: Extraocular movements intact.     Conjunctiva/sclera: Conjunctivae normal.     Pupils: Pupils are equal, round, and reactive to light.  Neck:     Musculoskeletal: Full passive range of motion without pain, normal range of motion and neck supple.  Cardiovascular:     Rate and Rhythm: Normal rate and regular rhythm.     Heart sounds: Normal heart sounds. No murmur. No friction rub. No gallop.   Pulmonary:     Effort: Pulmonary effort is normal. No respiratory distress.     Breath sounds: Normal breath sounds. No wheezing, rhonchi or rales.  Chest:     Chest wall: No tenderness or crepitus.  Abdominal:     General: Bowel sounds are normal. There is no distension.      Palpations: Abdomen is soft.     Tenderness: There is no abdominal tenderness. There is no guarding or rebound.  Musculoskeletal: Normal range of motion.        General: No tenderness.     Comments: Moves all extremities well.   Skin:    General: Skin is warm and dry.     Capillary Refill: Capillary refill takes less than 2 seconds.     Coloration: Skin is not pale.  Findings: No erythema or rash.  Neurological:     General: No focal deficit present.     Mental Status: She is alert and oriented to person, place, and time.     Cranial Nerves: No cranial nerve deficit.  Psychiatric:        Mood and Affect: Mood normal. Mood is not anxious.        Speech: Speech normal.        Behavior: Behavior normal.        Thought Content: Thought content normal.      ED Treatments / Results  Labs (all labs ordered are listed, but only abnormal results are displayed)  Results for orders placed or performed during the hospital encounter of 07/04/19  Urinalysis, Routine w reflex microscopic  Result Value Ref Range   Color, Urine YELLOW YELLOW   APPearance CLEAR CLEAR   Specific Gravity, Urine 1.026 1.005 - 1.030   pH 5.0 5.0 - 8.0   Glucose, UA NEGATIVE NEGATIVE mg/dL   Hgb urine dipstick NEGATIVE NEGATIVE   Bilirubin Urine NEGATIVE NEGATIVE   Ketones, ur NEGATIVE NEGATIVE mg/dL   Protein, ur 30 (A) NEGATIVE mg/dL   Nitrite NEGATIVE NEGATIVE   Leukocytes,Ua NEGATIVE NEGATIVE   RBC / HPF 0-5 0 - 5 RBC/hpf   WBC, UA 0-5 0 - 5 WBC/hpf   Bacteria, UA NONE SEEN NONE SEEN   Squamous Epithelial / LPF 0-5 0 - 5   Mucus PRESENT    Hyaline Casts, UA PRESENT   CBC  Result Value Ref Range   WBC 25.7 (H) 4.0 - 10.5 K/uL   RBC 5.11 3.87 - 5.11 MIL/uL   Hemoglobin 14.5 12.0 - 15.0 g/dL   HCT 13.045.8 86.536.0 - 78.446.0 %   MCV 89.6 80.0 - 100.0 fL   MCH 28.4 26.0 - 34.0 pg   MCHC 31.7 30.0 - 36.0 g/dL   RDW 69.613.9 29.511.5 - 28.415.5 %   Platelets 510 (H) 150 - 400 K/uL   nRBC 0.0 0.0 - 0.2 %  Comprehensive  metabolic panel  Result Value Ref Range   Sodium 140 135 - 145 mmol/L   Potassium 4.1 3.5 - 5.1 mmol/L   Chloride 112 (H) 98 - 111 mmol/L   CO2 19 (L) 22 - 32 mmol/L   Glucose, Bld 119 (H) 70 - 99 mg/dL   BUN 12 6 - 20 mg/dL   Creatinine, Ser 1.320.59 0.44 - 1.00 mg/dL   Calcium 9.3 8.9 - 44.010.3 mg/dL   Total Protein 7.5 6.5 - 8.1 g/dL   Albumin 4.0 3.5 - 5.0 g/dL   AST 15 15 - 41 U/L   ALT 21 0 - 44 U/L   Alkaline Phosphatase 76 38 - 126 U/L   Total Bilirubin 0.4 0.3 - 1.2 mg/dL   GFR calc non Af Amer >60 >60 mL/min   GFR calc Af Amer >60 >60 mL/min   Anion gap 9 5 - 15  Lipase, blood  Result Value Ref Range   Lipase 26 11 - 51 U/L  POC urine preg, ED  Result Value Ref Range   Preg Test, Ur NEGATIVE NEGATIVE   Laboratory interpretation all normal except leukocytosis consistent with her frequent episodes of vomiting.   EKG None  Radiology No results found.  Procedures Procedures (including critical care time)  Medications Ordered in ED Medications  sodium chloride flush (NS) 0.9 % injection 3 mL (3 mLs Intravenous Given 07/04/19 0146)  sodium chloride 0.9 % bolus 1,000 mL (0  mLs Intravenous Stopped 07/04/19 0350)  sodium chloride 0.9 % bolus 1,000 mL (0 mLs Intravenous Stopped 07/04/19 0350)  ondansetron (ZOFRAN) injection 4 mg (4 mg Intravenous Given 07/04/19 0147)  famotidine (PEPCID) IVPB 20 mg premix (0 mg Intravenous Stopped 07/04/19 0303)     Initial Impression / Assessment and Plan / ED Course  I have reviewed the triage vital signs and the nursing notes.  Pertinent labs & imaging results that were available during my care of the patient were reviewed by me and considered in my medical decision making (see chart for details).     Patient was given IV fluids and IV nausea medication.  Due to her having some burning or reflux symptoms she was given IV Pepcid.  Recheck 4:50 AM patient is sleeping.  When she is awakened she states she feels better.  She is willing to  try some oral fluids.  5:20 AM patient has been able to drink fluids without vomiting and feels ready to be discharged.  Final Clinical Impressions(s) / ED Diagnoses   Final diagnoses:  Nausea vomiting and diarrhea  Dehydration    ED Discharge Orders         Ordered    ondansetron (ZOFRAN) 4 MG tablet  Every 8 hours PRN     07/04/19 0528         Plan discharge  Devoria Albe, MD, Concha Pyo, MD 07/04/19 [phone]

## 2019-07-04 NOTE — ED Notes (Signed)
Pt given Ginger ale.

## 2019-07-13 ENCOUNTER — Other Ambulatory Visit: Payer: Self-pay | Admitting: Family Medicine

## 2019-07-13 DIAGNOSIS — G43809 Other migraine, not intractable, without status migrainosus: Secondary | ICD-10-CM

## 2019-07-13 DIAGNOSIS — G43009 Migraine without aura, not intractable, without status migrainosus: Secondary | ICD-10-CM

## 2019-07-15 ENCOUNTER — Other Ambulatory Visit: Payer: Self-pay

## 2019-07-15 ENCOUNTER — Ambulatory Visit (INDEPENDENT_AMBULATORY_CARE_PROVIDER_SITE_OTHER): Payer: PRIVATE HEALTH INSURANCE | Admitting: Family Medicine

## 2019-07-15 ENCOUNTER — Encounter: Payer: Self-pay | Admitting: Family Medicine

## 2019-07-15 DIAGNOSIS — J4541 Moderate persistent asthma with (acute) exacerbation: Secondary | ICD-10-CM

## 2019-07-15 MED ORDER — PREDNISONE 10 MG PO TABS: ORAL_TABLET | ORAL | 0 refills | Status: DC

## 2019-07-15 NOTE — Progress Notes (Signed)
Subjective:    Patient ID: Ruth King, female    DOB: [birth year], 18 y.o.   MRN: [id number]   HPI: Ruth King is a 18 y.o. female presenting for onset yesterday dyspnea. Used inhaler several times. It helped a little.  Chest felt tight. Denies cough and fever. History of childhood asthma.   Depression screen [facility] 2/9 01/24/2019 11/16/2018 11/05/2018 09/28/2018 08/20/2018  Decreased Interest 0 0 0 0 0  Down, Depressed, Hopeless 0 0 0 0 0  PHQ - 2 Score 0 0 0 0 0  Altered sleeping - - - - -  Tired, decreased energy - - - - -  Change in appetite - - - - -  Feeling bad or failure about yourself  - - - - -  Trouble concentrating - - - - -  Moving slowly or fidgety/restless - - - - -  Suicidal thoughts - - - - -  PHQ-9 Score - - - - -     Relevant past medical, surgical, family and social history reviewed and updated as indicated.  Interim medical history since our last visit reviewed. Allergies and medications reviewed and updated.  ROS:  Review of Systems  Constitutional: Negative for fever.  HENT: Negative for congestion, rhinorrhea and sore throat.   Respiratory: Positive for shortness of breath. Negative for cough.   Cardiovascular: Negative for chest pain and palpitations.  Gastrointestinal: Negative for abdominal pain.  Musculoskeletal: Negative for arthralgias and myalgias.     Social History   Tobacco Use  Smoking Status Never Smoker  Smokeless Tobacco Never Used       Objective:     Wt Readings from Last 3 Encounters:  07/03/19 230 lb (104.3 kg) (99 %, Z= 2.32)*  04/29/19 242 lb (109.8 kg) (>99 %, Z= 2.44)*  02/01/19 247 lb (112 kg) (>99 %, Z= 2.47)*   * Growth percentiles are based on CDC (Girls, 2-20 Years) data.     Exam deferred. Pt. Harboring due to COVID 19. Phone visit performed.   Assessment & Plan:   1. Moderate persistent chronic asthma with acute exacerbation     Meds ordered this encounter  Medications  . predniSONE  (DELTASONE) 10 MG tablet    Sig: Take 5 daily for 2 days followed by 4,3,2 and 1 for 2 days each.    Dispense:  30 tablet    Refill:  0    No orders of the defined types were placed in this encounter.     Diagnoses and all orders for this visit:  Moderate persistent chronic asthma with acute exacerbation  Other orders -     predniSONE (DELTASONE) 10 MG tablet; Take 5 daily for 2 days followed by 4,3,2 and 1 for 2 days each.    Virtual Visit via telephone Note  I discussed the limitations, risks, security and privacy concerns of performing an evaluation and management service by telephone and the availability of in person appointments. The patient was identified with two identifiers. Pt.expressed understanding and agreed to proceed. Pt. Is at home. Dr. Darlyn Read is in his office.  Follow Up Instructions:   I discussed the assessment and treatment plan with the patient. The patient was provided an opportunity to ask questions and all were answered. The patient agreed with the plan and demonstrated an understanding of the instructions.   The patient was advised to call back or seek an in-person evaluation if the symptoms worsen or if the condition fails to improve  as anticipated.   Total minutes including chart review and phone contact time: 12   Follow up plan: No follow-ups on file.  Claretta Fraise, MD [place]

## 2019-07-31 ENCOUNTER — Emergency Department (HOSPITAL_COMMUNITY)
Admission: EM | Admit: 2019-07-31 | Discharge: 2019-07-31 | Disposition: A | Discharge status: Home / Self Care | Payer: PRIVATE HEALTH INSURANCE | Attending: Emergency Medicine | Admitting: Emergency Medicine

## 2019-07-31 ENCOUNTER — Other Ambulatory Visit: Payer: Self-pay

## 2019-07-31 ENCOUNTER — Emergency Department (HOSPITAL_COMMUNITY): Payer: PRIVATE HEALTH INSURANCE

## 2019-07-31 ENCOUNTER — Encounter (HOSPITAL_COMMUNITY): Payer: Self-pay | Admitting: Emergency Medicine

## 2019-07-31 DIAGNOSIS — E119 Type 2 diabetes mellitus without complications: Secondary | ICD-10-CM | POA: Diagnosis not present

## 2019-07-31 DIAGNOSIS — K8020 Calculus of gallbladder without cholecystitis without obstruction: Secondary | ICD-10-CM | POA: Diagnosis not present

## 2019-07-31 DIAGNOSIS — Z7984 Long term (current) use of oral hypoglycemic drugs: Secondary | ICD-10-CM | POA: Diagnosis not present

## 2019-07-31 DIAGNOSIS — J45909 Unspecified asthma, uncomplicated: Secondary | ICD-10-CM | POA: Diagnosis not present

## 2019-07-31 DIAGNOSIS — R1011 Right upper quadrant pain: Secondary | ICD-10-CM | POA: Diagnosis present

## 2019-07-31 LAB — CBC
HCT: 43.4 % (ref 36.0–46.0)
Hemoglobin: 13.4 g/dL (ref 12.0–15.0)
MCH: 28.5 pg (ref 26.0–34.0)
MCHC: 30.9 g/dL (ref 30.0–36.0)
MCV: 92.1 fL (ref 80.0–100.0)
Platelets: 410 10*3/uL — ABNORMAL HIGH (ref 150–400)
RBC: 4.71 MIL/uL (ref 3.87–5.11)
RDW: 13.2 % (ref 11.5–15.5)
WBC: 17.4 10*3/uL — ABNORMAL HIGH (ref 4.0–10.5)
nRBC: 0 % (ref 0.0–0.2)

## 2019-07-31 LAB — COMPREHENSIVE METABOLIC PANEL
ALT: 34 U/L (ref 0–44)
AST: 23 U/L (ref 15–41)
Albumin: 3.9 g/dL (ref 3.5–5.0)
Alkaline Phosphatase: 73 U/L (ref 38–126)
Anion gap: 9 (ref 5–15)
BUN: 12 mg/dL (ref 6–20)
CO2: 22 mmol/L (ref 22–32)
Calcium: 9.3 mg/dL (ref 8.9–10.3)
Chloride: 107 mmol/L (ref 98–111)
Creatinine, Ser: 0.59 mg/dL (ref 0.44–1.00)
GFR calc Af Amer: >60 mL/min (ref >60)
GFR calc non Af Amer: >60 mL/min (ref >60)
Glucose, Bld: 122 mg/dL — ABNORMAL HIGH (ref 70–99)
Potassium: 3.5 mmol/L (ref 3.5–5.1)
Sodium: 138 mmol/L (ref 135–145)
Total Bilirubin: 0.4 mg/dL (ref 0.3–1.2)
Total Protein: 7.1 g/dL (ref 6.5–8.1)

## 2019-07-31 LAB — URINALYSIS, ROUTINE W REFLEX MICROSCOPIC
Bilirubin Urine: NEGATIVE
Glucose, UA: NEGATIVE mg/dL
Hgb urine dipstick: NEGATIVE
Ketones, ur: NEGATIVE mg/dL
Nitrite: NEGATIVE
Protein, ur: NEGATIVE mg/dL
Specific Gravity, Urine: 1.016 (ref 1.005–1.030)
pH: 7 (ref 5.0–8.0)

## 2019-07-31 LAB — LIPASE, BLOOD: Lipase: 26 U/L (ref 11–51)

## 2019-07-31 LAB — POC URINE PREG, ED: Preg Test, Ur: NEGATIVE

## 2019-07-31 MED ORDER — SODIUM CHLORIDE 0.9% FLUSH: 3.0000 mL | Freq: Once | INTRAVENOUS | Status: DC

## 2019-07-31 MED ORDER — OMEPRAZOLE 20 MG PO CPDR: 20.0000 mg | DELAYED_RELEASE_CAPSULE | Freq: Every day | ORAL | 0 refills | Status: DC

## 2019-07-31 NOTE — ED Provider Notes (Signed)
Ruth King   CSN: [id number] Arrival date & time: 07/31/19  1314     History   Chief Complaint Chief Complaint  Patient presents with   Abdominal Pain    HPI Ruth King is a 18 y.o. female.     HPI   Ruth King is a 18 y.o. female with past medical history of diabetes, asthma, and PCOS who presents to the Emergency Department complaining of sudden onset of right upper quadrant pain 3 hours prior to arrival.  Patient states she was at rest when onset began.  She describes as sharp pain to her right upper quadrant and epigastric area.  Pain is nonradiating.  She states that her symptoms have subsided since onset, lasting approximately 1 to 2 hours.  She denies any associated symptoms including fever, chills, nausea or vomiting or diarrhea.  Denies recent changes in diet, dysuria or flank pain.  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 Authorizing Provider  albuterol (PROVENTIL HFA;VENTOLIN HFA) 108 (90 Base) MCG/ACT inhaler Inhale 2 puffs into the lungs every 6 (six) hours as  needed for wheezing or shortness of breath. 10/08/18   Johna Sheriff, MD  Butalbital-APAP-Caffeine (FIORICET) 50-300-40 MG CAPS Take 1 capsule by mouth every 4 (four) hours as needed (headache). 06/18/19   Mechele Claude, MD  cetirizine (ZYRTEC) 10 MG tablet Take 1 tablet (10 mg total) by mouth at bedtime. 07/16/18   Johna Sheriff, MD  fluticasone (FLONASE) 50 MCG/ACT nasal spray Place 2 sprays into both nostrils daily. Patient taking differently: Place 2 sprays into both nostrils daily as needed for allergies or rhinitis.  05/26/16   Dettinger, Elige Radon, MD  metFORMIN (GLUCOPHAGE) 500 MG tablet Take 1 tablet (500 mg total) by mouth daily with breakfast. (Needs to be seen before next refill) 06/06/19   Dettinger, Elige Radon, MD  montelukast (SINGULAIR) 10 MG tablet Take 1 tablet (10 mg total) by mouth at bedtime. (Needs to be seen before next refill) 07/16/18   Johna Sheriff, MD  norethindrone-ethinyl estradiol-iron Elmarie Shiley FE 1.5/30) 1.5-30 MG-MCG tablet Take 1 tablet by mouth daily. Skip inactive pills x 2 mo, starting new pack immediately, then take for third month 08/20/18   Rex Kras  L, MD  ondansetron (ZOFRAN) 4 MG tablet Take 1 tablet (4 mg total) by mouth every 8 (eight) hours as needed. 07/04/19   Rolland Porter, MD  predniSONE (DELTASONE) 10 MG tablet Take 5 daily for 2 days followed by 4,3,2 and 1 for 2 days each. 07/15/19   Claretta Fraise, MD  rizatriptan (MAXALT) 10 MG tablet TAKE 1 TABLET BY MOUTH AS NEEDED. MAY REPEAT IN TWO HOURS IF NEEDED 07/15/19   Dettinger, Fransisca Kaufmann, MD  topiramate (TOPAMAX) 50 MG tablet Take 1 tablet (50 mg total) by mouth 2 (two) times daily. 04/03/19   Dettinger, Fransisca Kaufmann, MD  Ubrogepant (UBRELVY) 50 MG TABS Take 50 mg by mouth as directed. Take at onset of headache, may repeat after 2 hours if headache still present. Max 2 tabs per day. 04/03/19   Dettinger, Fransisca Kaufmann, MD    Family History Family History  Problem Relation Age of Onset   Asthma Mother    Gout  Father    Seizures Maternal Grandmother     Social History Social History   Tobacco Use   Smoking status: Never Smoker   Smokeless tobacco: Never Used  Substance Use Topics   Alcohol use: No   Drug use: No     Allergies   Bee venom, Azithromycin, and Cephalosporins   Review of Systems Review of Systems  Constitutional: Negative for appetite change, chills and fever.  Respiratory: Negative for shortness of breath.   Cardiovascular: Negative for chest pain.  Gastrointestinal: Positive for abdominal pain. Negative for constipation, diarrhea, nausea and vomiting.  Genitourinary: Negative for decreased urine volume, difficulty urinating, dysuria and flank pain.  Musculoskeletal: Negative for back pain.  Skin: Negative for color change and rash.  Neurological: Negative for weakness and numbness.  Hematological: Negative for adenopathy.     Physical Exam Updated Vital Signs BP 123/70 (BP Location: Right Arm)    Pulse 96    Temp 97.9 F (36.6 C) (Oral)    Resp 16    Ht 6' (1.829 m)    Wt 104.3 kg    LMP 07/17/2019    SpO2 100%    BMI 31.19 kg/m   Physical Exam Vitals signs and nursing King reviewed.  Constitutional:      General: She is not in acute distress.    Appearance: She is well-developed. She is not ill-appearing or toxic-appearing.     Comments: Elevated BMI  HENT:     Head: Normocephalic.     Mouth/Throat:     Mouth: Mucous membranes are moist.     Pharynx: Oropharynx is clear.  Cardiovascular:     Rate and Rhythm: Normal rate and regular rhythm.     Pulses: Normal pulses.  Pulmonary:     Effort: Pulmonary effort is normal. No respiratory distress.     Breath sounds: Normal breath sounds.  Abdominal:     General: Bowel sounds are normal. There is no distension.     Palpations: Abdomen is soft. There is no mass.     Tenderness: There is abdominal tenderness. There is no right CVA tenderness, left CVA tenderness, guarding or rebound.     Comments:  mild tenderness to palpation right upper quadrant.  No guarding or rebound tenderness.  Musculoskeletal: Normal range of motion.  Skin:    General: Skin is warm.     Capillary Refill: Capillary refill takes less than 2 seconds.  Neurological:     General: No focal deficit present.     Mental  Status: She is alert.     Sensory: No sensory deficit.     Motor: No abnormal muscle tone.      ED Treatments / Results  Labs (all labs ordered are listed, but only abnormal results are displayed) Labs Reviewed  COMPREHENSIVE METABOLIC PANEL - Abnormal; Notable for the following components:      Result Value   Glucose, Bld 122 (*)    All other components within normal limits  CBC - Abnormal; Notable for the following components:   WBC 17.4 (*)    Platelets 410 (*)    All other components within normal limits  URINALYSIS, ROUTINE W REFLEX MICROSCOPIC - Abnormal; Notable for the following components:   APPearance TURBID (*)    Leukocytes,Ua SMALL (*)    Bacteria, UA RARE (*)    All other components within normal limits  LIPASE, BLOOD  POC URINE PREG, ED    EKG None  Radiology [place] Abdomen Limited  Result Date: 07/31/2019 CLINICAL DATA:  Right upper quadrant pain EXAM: ULTRASOUND ABDOMEN LIMITED RIGHT UPPER QUADRANT COMPARISON:  None. FINDINGS: Gallbladder: Multiple shadowing stones measuring up to 1.5 cm. Normal wall thickness. Negative sonographic Murphy. Common bile duct: Diameter: 3.2 mm Liver: Borderline to slightly enlarged. Increased hepatic echogenicity without focal hepatic abnormality. Portal vein is patent on color Doppler imaging with normal direction of blood flow towards the liver. Other: None. IMPRESSION: 1. Cholelithiasis without sonographic evidence for acute cholecystitis or biliary dilatation 2. Echogenic liver as may be seen with steatosis Electronically Signed   By: Jasmine PangKim  Fujinaga M.D.   On: 07/31/2019 16:02    Procedures Procedures (including critical care  time)  Medications Ordered in ED Medications  sodium chloride flush (NS) 0.9 % injection 3 mL (3 mLs Intravenous Not Given 07/31/19 1455)     Initial Impression / Assessment and Plan / ED Course  I have reviewed the triage vital signs and the nursing notes.  Pertinent labs & imaging results that were available during my care of the patient were reviewed by me and considered in my medical decision making (see chart for details).        She was sudden onset of right upper quadrant and epigastric pain not associated with food intake.  Pain has improved prior to arrival.  She is well-appearing and nontoxic.  Vital signs reviewed.  Will obtain labs and ultrasound imaging of abdomen.  Ultrasound imaging of the right upper quadrant shows multiple gallbladder stones without gallbladder wall thickening or evidence of obstruction.  Transaminases are reassuring.  Patient appears appropriate for discharge home, dietary changes discussed.  Will start PPI.  Referral information provided for general surgery.  Strict return precautions also discussed at length.  Final Clinical Impressions(s) / ED Diagnoses   Final diagnoses:  Calculus of gallbladder without cholecystitis without obstruction    ED Discharge Orders    None       Rosey Bathriplett, Payden Docter, PA-C 07/31/19 1634    Benjiman CorePickering, Nathan, MD 07/31/19 2330

## 2019-07-31 NOTE — ED Triage Notes (Signed)
Patient reports RUQ pain that started about 3 hours ago. No V/D.

## 2019-07-31 NOTE — Discharge Instructions (Addendum)
As discussed, your ultrasound today shows you have several small stones in your gallbladder.  Pain can be triggered from certain foods, so you will benefit from a healthy diet and avoid greasy, spicy, or fatty foods.  The Prilosec may also help.  You may take your Zofran if needed for nausea or vomiting.  I have provided the information for the local surgeon to arrange follow-up.  Return to the ER for any worsening symptoms such as fever, vomiting or increasing abdominal pain.

## 2019-08-02 ENCOUNTER — Other Ambulatory Visit: Payer: Self-pay

## 2019-08-05 ENCOUNTER — Ambulatory Visit (INDEPENDENT_AMBULATORY_CARE_PROVIDER_SITE_OTHER): Payer: PRIVATE HEALTH INSURANCE | Admitting: Family Medicine

## 2019-08-05 ENCOUNTER — Encounter: Payer: Self-pay | Admitting: Family Medicine

## 2019-08-05 ENCOUNTER — Other Ambulatory Visit: Payer: Self-pay

## 2019-08-05 VITALS — BP 105/70 | HR 80 | Temp 97.8°F | Ht 73.0 in | Wt 255.0 lb

## 2019-08-05 DIAGNOSIS — G43009 Migraine without aura, not intractable, without status migrainosus: Secondary | ICD-10-CM

## 2019-08-05 DIAGNOSIS — Z0000 Encounter for general adult medical examination without abnormal findings: Secondary | ICD-10-CM

## 2019-08-05 DIAGNOSIS — Z0001 Encounter for general adult medical examination with abnormal findings: Secondary | ICD-10-CM | POA: Diagnosis not present

## 2019-08-05 DIAGNOSIS — J309 Allergic rhinitis, unspecified: Secondary | ICD-10-CM | POA: Diagnosis not present

## 2019-08-05 DIAGNOSIS — L680 Hirsutism: Secondary | ICD-10-CM

## 2019-08-05 DIAGNOSIS — G43809 Other migraine, not intractable, without status migrainosus: Secondary | ICD-10-CM

## 2019-08-05 DIAGNOSIS — E119 Type 2 diabetes mellitus without complications: Secondary | ICD-10-CM | POA: Diagnosis not present

## 2019-08-05 DIAGNOSIS — Z13220 Encounter for screening for lipoid disorders: Secondary | ICD-10-CM

## 2019-08-05 LAB — BAYER DCA HB A1C WAIVED: HB A1C (BAYER DCA - WAIVED): 5.9 % (ref <7.0)

## 2019-08-05 MED ORDER — OMEPRAZOLE 20 MG PO CPDR: 20.0000 mg | DELAYED_RELEASE_CAPSULE | Freq: Every day | ORAL | 3 refills | Status: DC

## 2019-08-05 MED ORDER — METFORMIN HCL 500 MG PO TABS: 500.0000 mg | ORAL_TABLET | Freq: Every day | ORAL | 3 refills | Status: DC

## 2019-08-05 MED ORDER — UBRELVY 50 MG PO TABS: 50.0000 mg | ORAL_TABLET | ORAL | 5 refills | Status: AC

## 2019-08-05 MED ORDER — MONTELUKAST SODIUM 10 MG PO TABS: 10.0000 mg | ORAL_TABLET | Freq: Every day | ORAL | 3 refills | Status: DC

## 2019-08-05 MED ORDER — TOPIRAMATE 50 MG PO TABS: 50.0000 mg | ORAL_TABLET | Freq: Two times a day (BID) | ORAL | 3 refills | Status: DC

## 2019-08-05 MED ORDER — CETIRIZINE HCL 10 MG PO TABS: 10.0000 mg | ORAL_TABLET | Freq: Every day | ORAL | 3 refills | Status: DC

## 2019-08-05 NOTE — Progress Notes (Signed)
BP 105/70   Pulse 80   Temp 97.8 F (36.6 C) (Temporal)   Ht 6' 1" (1.854 m)   Wt 255 lb (115.7 kg)   LMP 07/17/2019   SpO2 100%   BMI 33.64 kg/m    Subjective:   Patient ID: Ruth King, female    DOB: [birth year], 18 y.o.   MRN: [id number]  HPI: Ruth King is a 18 y.o. female presenting on 08/05/2019 for Annual Exam   HPI Adult well exam and physical and recheck of chronic issues. Patient is coming in today for adult well exam and physical without Pap smear.  She is coming in to recheck some of her chronic issues including diabetes and hirsutism and migraines.  She says her migraines are happening less frequently and seem to be somewhat controlled.  She uses Topamax and Ubrelvy.  She is currently on Metformin for diabetes and seems to be doing okay on that and her A1c looks good today.  I have a concern about hirsutism and early diabetes and want her to go see an endocrinologist which she has not had a chance to do yet we will redo the referrals for that.  She denies any major health issues and feels like she is doing really well.  Relevant past medical, surgical, family and social history reviewed and updated as indicated. Interim medical history since our last visit reviewed. Allergies and medications reviewed and updated.  Review of Systems  Constitutional: Negative for chills and fever.  HENT: Negative for ear pain and facial swelling.   Eyes: Negative for redness and visual disturbance.  Respiratory: Negative for chest tightness and shortness of breath.   Cardiovascular: Negative for chest pain and leg swelling.  Musculoskeletal: Negative for back pain and gait problem.  Skin: Negative for rash.  Neurological: Positive for headaches. Negative for dizziness and light-headedness.  Psychiatric/Behavioral: Negative for agitation and behavioral problems.  All other systems reviewed and are negative.   Per HPI unless specifically indicated above   Allergies as of  08/05/2019      Reactions   Bee Venom Shortness Of Breath   Azithromycin Other (See Comments)   Not effective   Cephalosporins Hives      Medication List       Accurate as of August 05, 2019 10:16 AM. If you have any questions, ask your nurse or doctor.        STOP taking these medications   predniSONE 10 MG tablet Commonly known as: DELTASONE Stopped by: Fransisca Kaufmann Dettinger, MD     TAKE these medications   albuterol 108 (90 Base) MCG/ACT inhaler Commonly known as: VENTOLIN HFA Inhale 2 puffs into the lungs every 6 (six) hours as needed for wheezing or shortness of breath.   Butalbital-APAP-Caffeine 50-300-40 MG Caps Commonly known as: Fioricet Take 1 capsule by mouth every 4 (four) hours as needed (headache).   cetirizine 10 MG tablet Commonly known as: ZYRTEC Take 1 tablet (10 mg total) by mouth at bedtime.   fluticasone 50 MCG/ACT nasal spray Commonly known as: FLONASE Place 2 sprays into both nostrils daily. What changed:   when to take this  reasons to take this   metFORMIN 500 MG tablet Commonly known as: GLUCOPHAGE Take 1 tablet (500 mg total) by mouth daily with breakfast. (Needs to be seen before next refill)   montelukast 10 MG tablet Commonly known as: SINGULAIR Take 1 tablet (10 mg total) by mouth at bedtime. (Needs to be seen before  next refill)   norethindrone-ethinyl estradiol-iron 1.5-30 MG-MCG tablet Commonly known as: Larin Fe 1.5/30 Take 1 tablet by mouth daily. Skip inactive pills x 2 mo, starting new pack immediately, then take for third month   omeprazole 20 MG capsule Commonly known as: PRILOSEC Take 1 capsule (20 mg total) by mouth daily.   ondansetron 4 MG tablet Commonly known as: ZOFRAN Take 1 tablet (4 mg total) by mouth every 8 (eight) hours as needed.   rizatriptan 10 MG tablet Commonly known as: MAXALT TAKE 1 TABLET BY MOUTH AS NEEDED. MAY REPEAT IN TWO HOURS IF NEEDED   topiramate 50 MG tablet Commonly known as:  TOPAMAX Take 1 tablet (50 mg total) by mouth 2 (two) times daily.   Ubrelvy 50 MG Tabs Generic drug: Ubrogepant Take 50 mg by mouth as directed. Take at onset of headache, may repeat after 2 hours if headache still present. Max 2 tabs per day.        Objective:   BP 105/70   Pulse 80   Temp 97.8 F (36.6 C) (Temporal)   Ht 6' 1" (1.854 m)   Wt 255 lb (115.7 kg)   LMP 07/17/2019   SpO2 100%   BMI 33.64 kg/m   Wt Readings from Last 3 Encounters:  08/05/19 255 lb (115.7 kg) (>99 %, Z= 2.56)*  07/31/19 230 lb (104.3 kg) (99 %, Z= 2.32)*  07/03/19 230 lb (104.3 kg) (99 %, Z= 2.32)*   * Growth percentiles are based on CDC (Girls, 2-20 Years) data.    Physical Exam Vitals signs and nursing note reviewed.  Constitutional:      General: She is not in acute distress.    Appearance: She is well-developed. She is not diaphoretic.     Comments: Facial hair and central obesity  Eyes:     Conjunctiva/sclera: Conjunctivae normal.  Cardiovascular:     Rate and Rhythm: Normal rate and regular rhythm.     Heart sounds: Normal heart sounds. No murmur.  Pulmonary:     Effort: Pulmonary effort is normal. No respiratory distress.     Breath sounds: Normal breath sounds. No wheezing.  Musculoskeletal: Normal range of motion.        General: No tenderness.  Skin:    General: Skin is warm and dry.     Findings: No rash.  Neurological:     Mental Status: She is alert and oriented to person, place, and time.     Coordination: Coordination normal.  Psychiatric:        Behavior: Behavior normal.       Assessment & Plan:   Problem List Items Addressed This Visit      Cardiovascular and Mediastinum   Migraine headache   Relevant Medications   topiramate (TOPAMAX) 50 MG tablet   Ubrogepant (UBRELVY) 50 MG TABS   Other Relevant Orders   CBC with Differential/Platelet (Completed)     Endocrine   Diabetes mellitus without complication (HCC)   Relevant Medications   metFORMIN  (GLUCOPHAGE) 500 MG tablet   Other Relevant Orders   Microalbumin / creatinine urine ratio   Bayer DCA Hb A1c Waived (Completed)   Ambulatory referral to Pediatric Endocrinology   CBC with Differential/Platelet (Completed)   CMP14+EGFR (Completed)    Other Visit Diagnoses    Well adult exam    -  Primary   Allergic rhinitis       Relevant Medications   cetirizine (ZYRTEC) 10 MG tablet   montelukast (SINGULAIR) 10   MG tablet   Hirsutism       Relevant Orders   Ambulatory referral to Pediatric Endocrinology   Lipid screening       Relevant Orders   Lipid panel (Completed)       I have a concern about hirsutism and early diabetes and want her to go see an endocrinologist which she has not had a chance to do yet we will redo the referrals for that   We will check blood work and screening today. Continue metformin  Follow up plan: Return in about 3 months (around 11/05/2019), or if symptoms worsen or fail to improve, for Diabetes and migraines.  Counseling provided for all of the vaccine components No orders of the defined types were placed in this encounter.   Joshua Dettinger, MD [facility] 08/05/2019, 10:16 AM     

## 2019-08-06 LAB — CBC WITH DIFFERENTIAL/PLATELET
Basophils Absolute: 0 10*3/uL (ref 0.0–0.2)
Basos: 0 %
EOS (ABSOLUTE): 0.1 10*3/uL (ref 0.0–0.4)
Eos: 1 %
Hematocrit: 40.7 % (ref 34.0–46.6)
Hemoglobin: 13.1 g/dL (ref 11.1–15.9)
Immature Grans (Abs): 0 10*3/uL (ref 0.0–0.1)
Immature Granulocytes: 0 %
Lymphocytes Absolute: 2.7 10*3/uL (ref 0.7–3.1)
Lymphs: 23 %
MCH: 28.4 pg (ref 26.6–33.0)
MCHC: 32.2 g/dL (ref 31.5–35.7)
MCV: 88 fL (ref 79–97)
Monocytes Absolute: 0.7 10*3/uL (ref 0.1–0.9)
Monocytes: 6 %
Neutrophils Absolute: 8.3 10*3/uL — ABNORMAL HIGH (ref 1.4–7.0)
Neutrophils: 70 %
Platelets: 405 10*3/uL (ref 150–450)
RBC: 4.61 x10E6/uL (ref 3.77–5.28)
RDW: 12.9 % (ref 11.7–15.4)
WBC: 11.8 10*3/uL — ABNORMAL HIGH (ref 3.4–10.8)

## 2019-08-06 LAB — CMP14+EGFR
ALT: 26 IU/L (ref 0–32)
AST: 15 IU/L (ref 0–40)
Albumin/Globulin Ratio: 2.1 (ref 1.2–2.2)
Albumin: 4.2 g/dL (ref 3.9–5.0)
Alkaline Phosphatase: 84 IU/L (ref 39–117)
BUN/Creatinine Ratio: 15 (ref 9–23)
BUN: 10 mg/dL (ref 6–20)
Bilirubin Total: 0.3 mg/dL (ref 0.0–1.2)
CO2: 24 mmol/L (ref 20–29)
Calcium: 9.5 mg/dL (ref 8.7–10.2)
Chloride: 108 mmol/L — ABNORMAL HIGH (ref 96–106)
Creatinine, Ser: 0.65 mg/dL (ref 0.57–1.00)
GFR calc Af Amer: 149 mL/min/{1.73_m2} (ref >59)
GFR calc non Af Amer: 129 mL/min/{1.73_m2} (ref >59)
Globulin, Total: 2 g/dL (ref 1.5–4.5)
Glucose: 80 mg/dL (ref 65–99)
Potassium: 4.2 mmol/L (ref 3.5–5.2)
Sodium: 141 mmol/L (ref 134–144)
Total Protein: 6.2 g/dL (ref 6.0–8.5)

## 2019-08-06 LAB — LIPID PANEL
Chol/HDL Ratio: 4.7 ratio — ABNORMAL HIGH (ref 0.0–4.4)
Cholesterol, Total: 161 mg/dL (ref 100–169)
HDL: 34 mg/dL — ABNORMAL LOW (ref >39)
LDL Chol Calc (NIH): 111 mg/dL — ABNORMAL HIGH (ref 0–109)
Triglycerides: 83 mg/dL (ref 0–89)
VLDL Cholesterol Cal: 16 mg/dL (ref 5–40)

## 2019-08-24 IMAGING — DX RIGHT WRIST - COMPLETE 3+ VIEW
3 series · 3 of 3 positions shown · non-contrast
Comparison: None.

CLINICAL DATA: Right wrist pain after injury 2 days ago.

EXAM:
RIGHT WRIST - COMPLETE 3+ VIEW

[wrist ap]
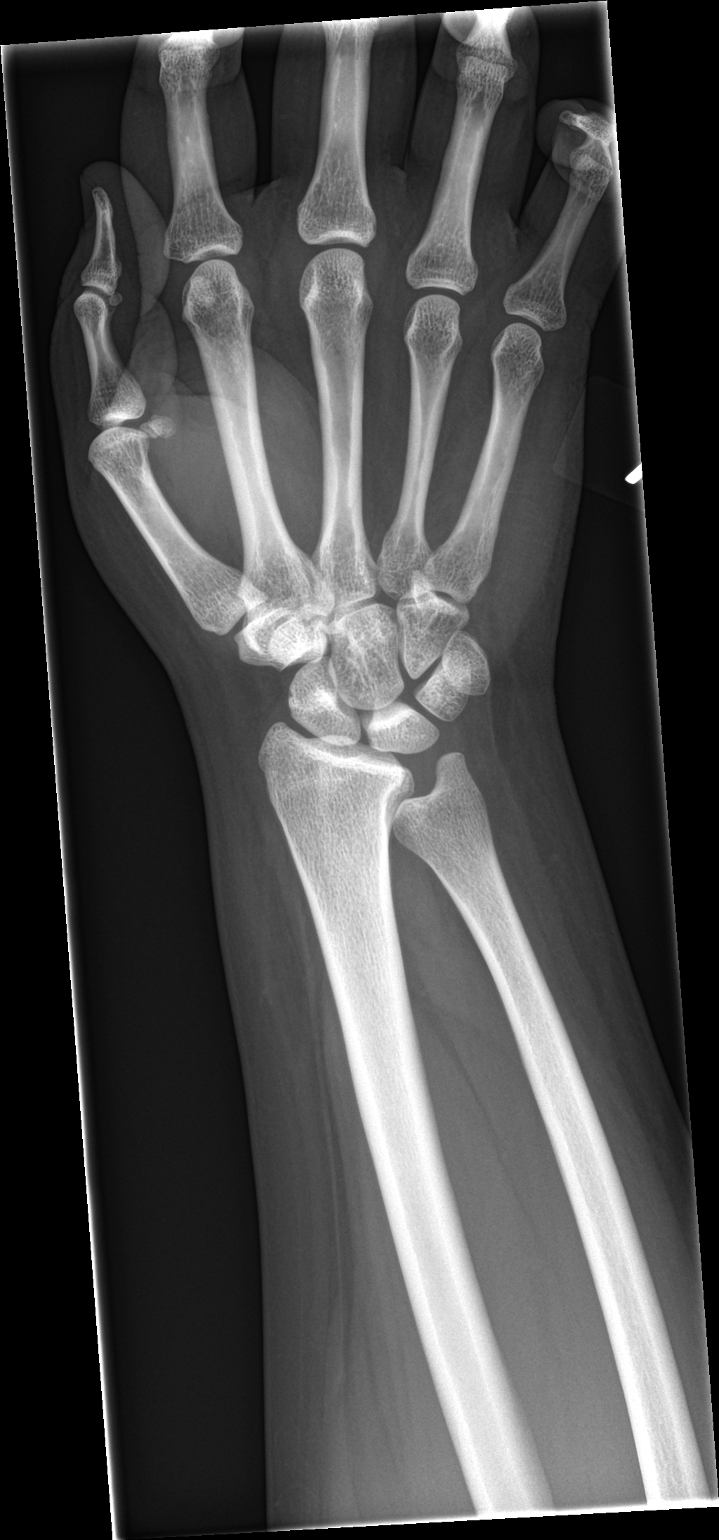

[wrist obl]
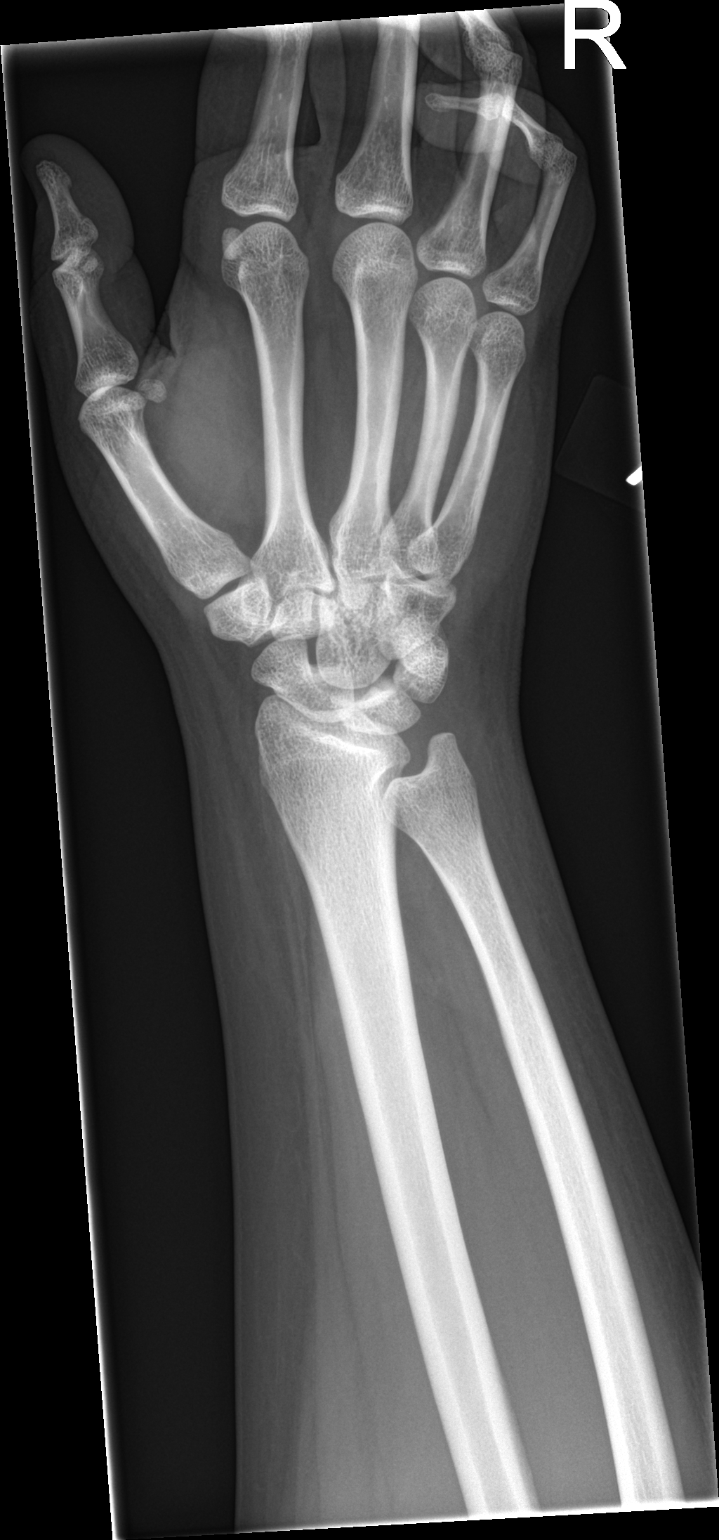

[wrist lat]
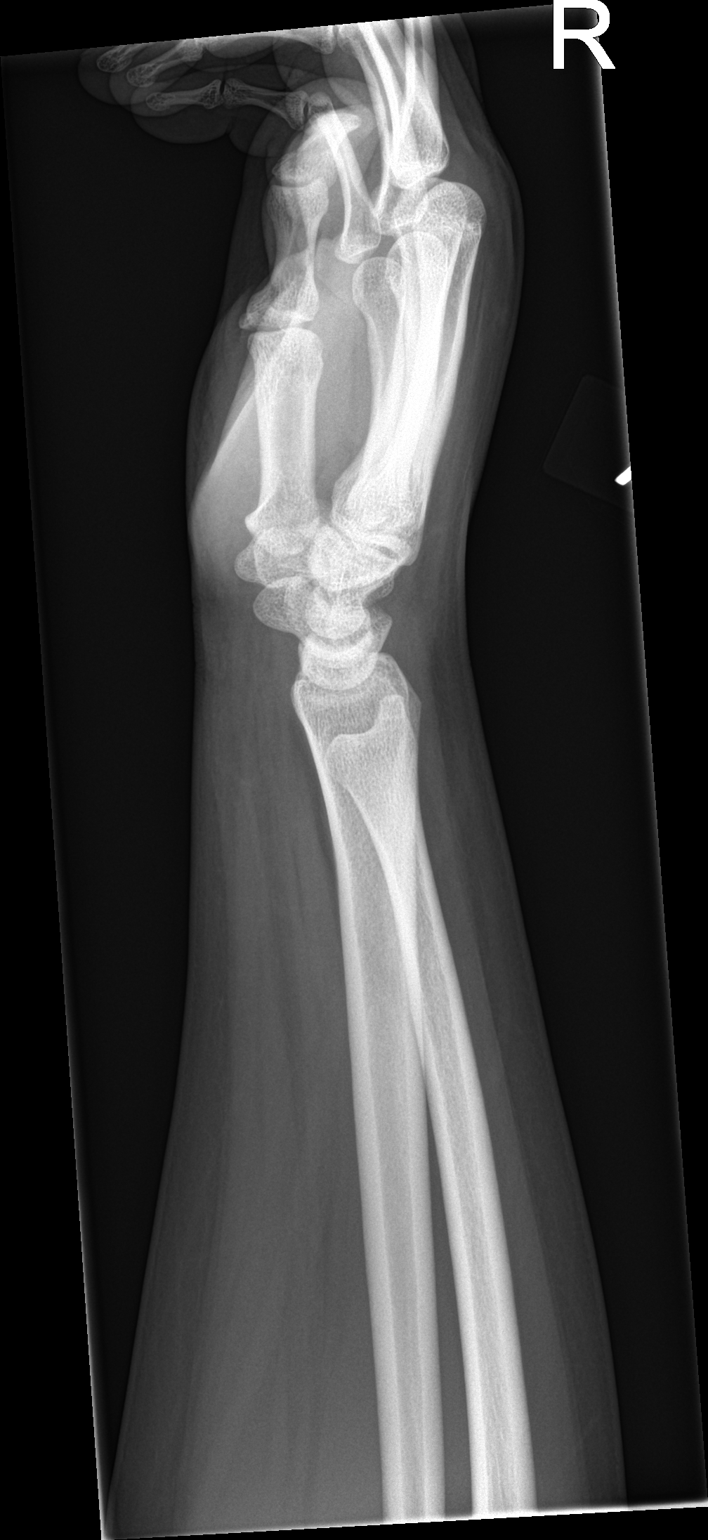

[3 of 3 positions shown; findings below may reference images not displayed]

FINDINGS: There is no evidence of fracture or dislocation. There is no
evidence of arthropathy. Negative ulnar variance is noted. Soft
tissues are unremarkable.
IMPRESSION: No acute abnormality seen in the right wrist.

## 2019-08-27 ENCOUNTER — Other Ambulatory Visit: Payer: Self-pay | Admitting: *Deleted

## 2019-08-27 DIAGNOSIS — N915 Oligomenorrhea, unspecified: Secondary | ICD-10-CM

## 2019-08-27 MED ORDER — NORETHIN ACE-ETH ESTRAD-FE 1.5-30 MG-MCG PO TABS: 1.0000 | ORAL_TABLET | Freq: Every day | ORAL | 3 refills | Status: DC

## 2019-09-25 ENCOUNTER — Ambulatory Visit: Payer: PRIVATE HEALTH INSURANCE | Admitting: "Endocrinology

## 2019-09-25 ENCOUNTER — Encounter: Payer: Self-pay | Admitting: "Endocrinology

## 2019-11-16 ENCOUNTER — Other Ambulatory Visit: Payer: Self-pay | Admitting: Family Medicine

## 2019-11-16 DIAGNOSIS — G43809 Other migraine, not intractable, without status migrainosus: Secondary | ICD-10-CM

## 2019-11-16 DIAGNOSIS — G43009 Migraine without aura, not intractable, without status migrainosus: Secondary | ICD-10-CM

## 2019-11-17 ENCOUNTER — Encounter (HOSPITAL_COMMUNITY): Payer: Self-pay | Admitting: Emergency Medicine

## 2019-11-17 ENCOUNTER — Emergency Department (HOSPITAL_COMMUNITY): Payer: PRIVATE HEALTH INSURANCE

## 2019-11-17 ENCOUNTER — Emergency Department (HOSPITAL_COMMUNITY)
Admission: EM | Admit: 2019-11-17 | Discharge: 2019-11-17 | Disposition: A | Discharge status: Home / Self Care | Payer: PRIVATE HEALTH INSURANCE | Source: Home / Self Care | Attending: Emergency Medicine | Admitting: Emergency Medicine

## 2019-11-17 ENCOUNTER — Other Ambulatory Visit: Payer: Self-pay

## 2019-11-17 ENCOUNTER — Observation Stay (HOSPITAL_COMMUNITY)
Admission: EM | Admit: 2019-11-17 | Discharge: 2019-11-19 | Disposition: A | Discharge status: Home / Self Care | Payer: PRIVATE HEALTH INSURANCE | Attending: General Surgery | Admitting: General Surgery

## 2019-11-17 DIAGNOSIS — K8020 Calculus of gallbladder without cholecystitis without obstruction: Secondary | ICD-10-CM

## 2019-11-17 DIAGNOSIS — K219 Gastro-esophageal reflux disease without esophagitis: Secondary | ICD-10-CM | POA: Diagnosis not present

## 2019-11-17 DIAGNOSIS — Z7984 Long term (current) use of oral hypoglycemic drugs: Secondary | ICD-10-CM | POA: Diagnosis not present

## 2019-11-17 DIAGNOSIS — J45909 Unspecified asthma, uncomplicated: Secondary | ICD-10-CM | POA: Insufficient documentation

## 2019-11-17 DIAGNOSIS — Z79899 Other long term (current) drug therapy: Secondary | ICD-10-CM | POA: Insufficient documentation

## 2019-11-17 DIAGNOSIS — Z20822 Contact with and (suspected) exposure to covid-19: Secondary | ICD-10-CM | POA: Diagnosis not present

## 2019-11-17 DIAGNOSIS — E119 Type 2 diabetes mellitus without complications: Secondary | ICD-10-CM | POA: Diagnosis not present

## 2019-11-17 DIAGNOSIS — G43909 Migraine, unspecified, not intractable, without status migrainosus: Secondary | ICD-10-CM | POA: Diagnosis not present

## 2019-11-17 DIAGNOSIS — Z793 Long term (current) use of hormonal contraceptives: Secondary | ICD-10-CM | POA: Insufficient documentation

## 2019-11-17 DIAGNOSIS — R1013 Epigastric pain: Secondary | ICD-10-CM

## 2019-11-17 DIAGNOSIS — K8010 Calculus of gallbladder with chronic cholecystitis without obstruction: Principal | ICD-10-CM | POA: Insufficient documentation

## 2019-11-17 LAB — CBC WITH DIFFERENTIAL/PLATELET
Abs Immature Granulocytes: 0.05 10*3/uL (ref 0.00–0.07)
Abs Immature Granulocytes: 0.06 10*3/uL (ref 0.00–0.07)
Basophils Absolute: 0 10*3/uL (ref 0.0–0.1)
Basophils Absolute: 0 10*3/uL (ref 0.0–0.1)
Basophils Relative: 0 %
Basophils Relative: 0 %
Eosinophils Absolute: 0.1 10*3/uL (ref 0.0–0.5)
Eosinophils Absolute: 0.1 10*3/uL (ref 0.0–0.5)
Eosinophils Relative: 1 %
Eosinophils Relative: 1 %
HCT: 41.2 % (ref 36.0–46.0)
HCT: 43.7 % (ref 36.0–46.0)
Hemoglobin: 13 g/dL (ref 12.0–15.0)
Hemoglobin: 13.5 g/dL (ref 12.0–15.0)
Immature Granulocytes: 0 %
Immature Granulocytes: 1 %
Lymphocytes Relative: 18 %
Lymphocytes Relative: 19 %
Lymphs Abs: 2.3 10*3/uL (ref 0.7–4.0)
Lymphs Abs: 2.3 10*3/uL (ref 0.7–4.0)
MCH: 28.3 pg (ref 26.0–34.0)
MCH: 28.6 pg (ref 26.0–34.0)
MCHC: 30.9 g/dL (ref 30.0–36.0)
MCHC: 31.6 g/dL (ref 30.0–36.0)
MCV: 90.5 fL (ref 80.0–100.0)
MCV: 91.6 fL (ref 80.0–100.0)
Monocytes Absolute: 0.5 10*3/uL (ref 0.1–1.0)
Monocytes Absolute: 0.7 10*3/uL (ref 0.1–1.0)
Monocytes Relative: 4 %
Monocytes Relative: 5 %
Neutro Abs: 9 10*3/uL — ABNORMAL HIGH (ref 1.7–7.7)
Neutro Abs: 9.5 10*3/uL — ABNORMAL HIGH (ref 1.7–7.7)
Neutrophils Relative %: 75 %
Neutrophils Relative %: 76 %
Platelets: 417 10*3/uL — ABNORMAL HIGH (ref 150–400)
Platelets: 432 10*3/uL — ABNORMAL HIGH (ref 150–400)
RBC: 4.55 MIL/uL (ref 3.87–5.11)
RBC: 4.77 MIL/uL (ref 3.87–5.11)
RDW: 13.3 % (ref 11.5–15.5)
RDW: 13.4 % (ref 11.5–15.5)
WBC: 12 10*3/uL — ABNORMAL HIGH (ref 4.0–10.5)
WBC: 12.6 10*3/uL — ABNORMAL HIGH (ref 4.0–10.5)
nRBC: 0 % (ref 0.0–0.2)
nRBC: 0 % (ref 0.0–0.2)

## 2019-11-17 LAB — URINALYSIS, ROUTINE W REFLEX MICROSCOPIC
Bilirubin Urine: NEGATIVE
Glucose, UA: NEGATIVE mg/dL
Hgb urine dipstick: NEGATIVE
Ketones, ur: NEGATIVE mg/dL
Nitrite: NEGATIVE
Protein, ur: NEGATIVE mg/dL
Specific Gravity, Urine: 1.019 (ref 1.005–1.030)
WBC, UA: >50 WBC/hpf — ABNORMAL HIGH (ref 0–5)
pH: 5 (ref 5.0–8.0)

## 2019-11-17 LAB — COMPREHENSIVE METABOLIC PANEL
ALT: 31 U/L (ref 0–44)
AST: 20 U/L (ref 15–41)
Albumin: 3.8 g/dL (ref 3.5–5.0)
Alkaline Phosphatase: 79 U/L (ref 38–126)
Anion gap: 7 (ref 5–15)
BUN: 10 mg/dL (ref 6–20)
CO2: 21 mmol/L — ABNORMAL LOW (ref 22–32)
Calcium: 9.2 mg/dL (ref 8.9–10.3)
Chloride: 110 mmol/L (ref 98–111)
Creatinine, Ser: 0.56 mg/dL (ref 0.44–1.00)
GFR calc Af Amer: >60 mL/min (ref >60)
GFR calc non Af Amer: >60 mL/min (ref >60)
Glucose, Bld: 126 mg/dL — ABNORMAL HIGH (ref 70–99)
Potassium: 3.7 mmol/L (ref 3.5–5.1)
Sodium: 138 mmol/L (ref 135–145)
Total Bilirubin: 0.1 mg/dL — ABNORMAL LOW (ref 0.3–1.2)
Total Protein: 7.5 g/dL (ref 6.5–8.1)

## 2019-11-17 LAB — LIPASE, BLOOD: Lipase: 25 U/L (ref 11–51)

## 2019-11-17 LAB — POC URINE PREG, ED: Preg Test, Ur: NEGATIVE

## 2019-11-17 MED ORDER — MORPHINE SULFATE (PF) 4 MG/ML IV SOLN
4.0000 mg | Freq: Once | INTRAVENOUS | Status: AC
  Administered 2019-11-17: 4 mg via INTRAVENOUS
  Filled 2019-11-17: qty 1

## 2019-11-17 MED ORDER — ONDANSETRON HCL 4 MG/2ML IJ SOLN
4.0000 mg | Freq: Once | INTRAMUSCULAR | Status: AC
  Administered 2019-11-17: 4 mg via INTRAVENOUS
  Filled 2019-11-17: qty 2

## 2019-11-17 MED ORDER — OMEPRAZOLE 20 MG PO CPDR: 20.0000 mg | DELAYED_RELEASE_CAPSULE | Freq: Every day | ORAL | 1 refills | Status: DC

## 2019-11-17 MED ORDER — SODIUM CHLORIDE 0.9 % IV BOLUS
1000.0000 mL | Freq: Once | INTRAVENOUS | Status: AC
  Administered 2019-11-17: 1000 mL via INTRAVENOUS

## 2019-11-17 MED ORDER — ONDANSETRON HCL 4 MG PO TABS: 4.0000 mg | ORAL_TABLET | Freq: Four times a day (QID) | ORAL | 0 refills | Status: DC

## 2019-11-17 NOTE — ED Triage Notes (Signed)
Pt C/O upper abd pain. Pt seen for same this morning and Dx with "gallstones". Pt states she was told if she hurt for more than 2 hours to return to the ER. Pt reports emesis X 1 after being D/C today.

## 2019-11-17 NOTE — ED Provider Notes (Signed)
[facility] EMERGENCY DEPARTMENT Provider Note   CSN: [id number] Arrival date & time: 11/17/19  3016     History Chief Complaint  Patient presents with  . Abdominal Pain    Ruth King is a 19 y.o. female.  HPI      Ruth King is a 19 y.o. female who presents to the Emergency Department complaining of upper abdominal pain upon waking this morning.  She reports a history of gallstones and states her current symptoms feel similar to previous gallbladder attacks.  She describes a constant, sharp pain in her mid upper abdomen that occasionally radiates to both sides.  Pain is associated with nausea but without vomiting or diarrhea.  She has been intermittently taking antacids with little to no relief.  She admits to eating some spicy food yesterday.  She denies alcohol use.  Nothing makes her pain better.  She denies fever, chest pain, cough, dysuria or pain to her back or flanks.  She denies abnormal vaginal bleeding or pelvic pain.  No vaginal discharge.   Past Medical History:  Diagnosis Date  . Asthma   . Nausea & vomiting     Patient Active Problem List   Diagnosis Date Noted  . Depression, recurrent (HCC) 03/11/2019  . Migraine headache 01/24/2019  . Diabetes mellitus without complication (HCC) 11/16/2018  . 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 Asthma, chronic 02/25/2016  . PCOS (polycystic ovarian syndrome) 09/23/2015  . Development delay 09/23/2015  . Learning disability 09/23/2015  . Nausea & vomiting     Past Surgical History:  Procedure Laterality Date  . ADENOIDECTOMY    . APPENDECTOMY    . tubes in ears       OB History    Gravida      Para      Term      Preterm      AB      Living  0     SAB      TAB      Ectopic      Multiple      Live Births              Family History  Problem Relation Age of Onset  . Asthma Mother   . Gout Father   . Seizures Maternal Grandmother     Social History   Tobacco Use    . Smoking status: Never Smoker  . Smokeless tobacco: Never Used  Substance Use Topics  . Alcohol use: No  . Drug use: No    Home Medications Prior to Admission medications   Medication Sig Start Date End Date Taking? Authorizing Provider  albuterol (PROVENTIL HFA;VENTOLIN HFA) 108 (90 Base) MCG/ACT inhaler Inhale 2 puffs into the lungs every 6 (six) hours as needed for wheezing or shortness of breath. 10/08/18   Johna Sheriff, MD  Butalbital-APAP-Caffeine (FIORICET) 50-300-40 MG CAPS Take 1 capsule by mouth every 4 (four) hours as needed (headache). 06/18/19   Mechele Claude, MD  cetirizine (ZYRTEC) 10 MG tablet Take 1 tablet (10 mg total) by mouth at bedtime. 08/05/19   Dettinger, Elige Radon, MD  fluticasone (FLONASE) 50 MCG/ACT nasal spray Place 2 sprays into both nostrils daily. Patient taking differently: Place 2 sprays into both nostrils daily as needed for allergies or rhinitis.  05/26/16   Dettinger, Elige Radon, MD  metFORMIN (GLUCOPHAGE) 500 MG tablet Take 1 tablet (500 mg total) by mouth daily with breakfast. 08/05/19   Dettinger, Elige Radon, MD  montelukast (SINGULAIR) 10 MG tablet Take 1 tablet (10 mg total) by mouth at bedtime. 08/05/19   Dettinger, Elige Radon, MD  norethindrone-ethinyl estradiol-iron (LARIN FE 1.5/30) 1.5-30 MG-MCG tablet Take 1 tablet by mouth daily. Skip inactive pills x 2 mo, starting new pack immediately, then take for third month 08/27/19   Dettinger, Elige Radon, MD  omeprazole (PRILOSEC) 20 MG capsule Take 1 capsule (20 mg total) by mouth daily. 08/05/19   Dettinger, Elige Radon, MD  ondansetron (ZOFRAN) 4 MG tablet Take 1 tablet (4 mg total) by mouth every 8 (eight) hours as needed. 07/04/19   Devoria Albe, MD  rizatriptan (MAXALT) 10 MG tablet TAKE 1 TABLET BY MOUTH AS NEEDED. MAY REPEAT IN TWO HOURS IF NEEDED 07/15/19   Dettinger, Elige Radon, MD  topiramate (TOPAMAX) 50 MG tablet Take 1 tablet (50 mg total) by mouth 2 (two) times daily. 08/05/19   Dettinger, Elige Radon, MD   Ubrogepant (UBRELVY) 50 MG TABS Take 50 mg by mouth as directed. Take at onset of headache, may repeat after 2 hours if headache still present. Max 2 tabs per day. 08/05/19   Dettinger, Elige Radon, MD    Allergies    Bee venom, Azithromycin, and Cephalosporins  Review of Systems   Review of Systems  Constitutional: Negative for appetite change, chills, fever and unexpected weight change.  Respiratory: Negative for cough and shortness of breath.   Cardiovascular: Negative for chest pain.  Gastrointestinal: Positive for abdominal pain and nausea. Negative for abdominal distention, blood in stool, diarrhea and vomiting.  Genitourinary: Negative for decreased urine volume, dysuria, flank pain, vaginal bleeding and vaginal discharge.  Musculoskeletal: Negative for back pain.  Skin: Negative for rash.  Neurological: Negative for weakness, numbness and headaches.  Hematological: Negative for adenopathy.    Physical Exam Updated Vital Signs BP 134/87 (BP Location: Left Arm)   Pulse 80   Temp 98.4 F (36.9 C) (Oral)   Resp 16   Ht 6' (1.829 m)   Wt 115.7 kg   LMP 11/11/2019   SpO2 100%   BMI 34.59 kg/m   Physical Exam Vitals and nursing note reviewed.  Constitutional:      Appearance: She is well-developed. She is not toxic-appearing.     Comments: Pt is uncomfortable appearing  HENT:     Mouth/Throat:     Mouth: Mucous membranes are moist.     Pharynx: Oropharynx is clear.  Cardiovascular:     Rate and Rhythm: Normal rate and regular rhythm.     Pulses: Normal pulses.  Pulmonary:     Effort: Pulmonary effort is normal.     Breath sounds: Normal breath sounds. No stridor. No wheezing.  Chest:     Chest wall: No tenderness.  Abdominal:     Palpations: Abdomen is soft. There is no mass.     Tenderness: There is abdominal tenderness in the epigastric area. There is no left CVA tenderness or guarding. Negative signs include McBurney's sign.     Comments: ttp of the epigastric  area.  No guarding or rebound tenderness.  Abdomen is soft.    Musculoskeletal:        General: Normal range of motion.  Skin:    General: Skin is warm.     Capillary Refill: Capillary refill takes less than 2 seconds.     Findings: No erythema or rash.  Neurological:     General: No focal deficit present.     Mental Status: She is alert.  ED Results / Procedures / Treatments   Labs (all labs ordered are listed, but only abnormal results are displayed) Labs Reviewed  COMPREHENSIVE METABOLIC PANEL - Abnormal; Notable for the following components:      Result Value   CO2 21 (*)    Glucose, Bld 126 (*)    Total Bilirubin 0.1 (*)    All other components within normal limits  URINALYSIS, ROUTINE W REFLEX MICROSCOPIC - Abnormal; Notable for the following components:   APPearance CLOUDY (*)    Leukocytes,Ua LARGE (*)    WBC, UA >50 (*)    Bacteria, UA RARE (*)    All other components within normal limits  CBC WITH DIFFERENTIAL/PLATELET - Abnormal; Notable for the following components:   WBC 12.0 (*)    Platelets 432 (*)    Neutro Abs 9.0 (*)    All other components within normal limits  URINE CULTURE  LIPASE, BLOOD  POC URINE PREG, ED    EKG None  Radiology US Abdomen Limited  Result Date: 11/17/2019 CLINICAL DATA:  Right upper quadrant and epigastric pain EXAM: ULTRASOUND ABDOMEN LIMITED RIGHT UPPER QUADRANT COMPARISON:  Abdominal ultrasound 07/31/2019 FINDINGS: Gallbladder: There are multiple shadowing gallstones measuring up to 1.4 cm. No wall thickening visualized. No sonographic Murphy sign noted by sonographer. Common bile duct: Diameter: 0.4 cm, within normal limits Liver: Assessment somewhat limited by shadowing bowel gas. No focal lesion identified. Within normal limits in parenchymal echogenicity. Portal vein is patent on color Doppler imaging with normal direction of blood flow towards the liver. Other: None. IMPRESSION: Cholelithiasis without evidence of  cholecystitis. Electronically Signed   By: Audie Pinto M.D.   On: 11/17/2019 12:04    Procedures Procedures (including critical care time)  Medications Ordered in ED Medications - No data to display  ED Course  I have reviewed the triage vital signs and the nursing notes.  Pertinent labs & imaging results that were available during my care of the patient were reviewed by me and considered in my medical decision making (see chart for details).    MDM Rules/Calculators/A&P                      On review of medical records, patient seen here in October of last year and treated for cholelithiasis.  She has not followed up with surgery.  1030  On recheck, pt reports feeling better after receiving IV medication.  Awaiting [place].    1220  [place] results discussed with pt.  No concerning sx's for emergent process.  Transaminases are reassuring.  Urinalysis shows large leukocytes and rare bacteria, patient denies any dysuria symptoms and exam does not show CVA tenderness.  Since patient does not have dysuria symptoms, I will culture urine and withhold treating with antibiotics.  She is nontoxic-appearing, I feel she is appropriate for discharge home.  Dietary changes discussed and she agrees to close follow-up with PCP and referral information provided for general surgery.  Strict ER return precautions also given.  Final Clinical Impression(s) / ED Diagnoses Final diagnoses:  Epigastric pain  Calculus of gallbladder without cholecystitis without obstruction    Rx / DC Orders ED Discharge Orders    None       Kem Parkinson, PA-C 11/17/19 1234    Long, [facility], MD 11/17/19 1322

## 2019-11-17 NOTE — ED Provider Notes (Signed)
[facility] EMERGENCY DEPARTMENT Provider Note   CSN: [id number] Arrival date & time: 11/17/19  2242     History Chief Complaint  Patient presents with  . Abdominal Pain    Ruth King is a 19 y.o. female  Returns this evening with worsening epigastric pain, nausea and emesis x 1 since being discharged home from here with known gallstone but no evidence of acute cholecystitis per lab tests and US imaging. She reports a history of gallstones but has not pursued surgical resolution of this problem.  She felt better when she left here, but woke this afternoon after napping with nausea, took zofran, tried to eat some broth and had severe worsened pain with emesis x 1.  She has found no alleviators for her pain.  She denies fevers, chills, cough, chest pain and sob.   The history is provided by the patient.       Past Medical History:  Diagnosis Date  . Asthma   . Nausea & vomiting     Patient Active Problem List   Diagnosis Date Noted  . Cholelithiasis 11/18/2019  . Depression, recurrent ([place]) 03/11/2019  . Migraine headache 01/24/2019  . Diabetes mellitus without complication ([place]) 13/24/4010  . 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 Asthma, chronic 02/25/2016  . PCOS (polycystic ovarian syndrome) 09/23/2015  . Development delay 09/23/2015  . Learning disability 09/23/2015  . Nausea & vomiting     Past Surgical History:  Procedure Laterality Date  . ADENOIDECTOMY    . APPENDECTOMY    . tubes in ears       OB History    Gravida      Para      Term      Preterm      AB      Living  0     SAB      TAB      Ectopic      Multiple      Live Births              Family History  Problem Relation Age of Onset  . Asthma Mother   . Gout Father   . Seizures Maternal Grandmother     Social History   Tobacco Use  . Smoking status: Never Smoker  . Smokeless tobacco: Never Used  Substance Use Topics  . Alcohol use: No  . Drug use: No      Home Medications Prior to Admission medications   Medication Sig Start Date End Date Taking? Authorizing Provider  albuterol (PROVENTIL HFA;VENTOLIN HFA) 108 (90 Base) MCG/ACT inhaler Inhale 2 puffs into the lungs every 6 (six) hours as needed for wheezing or shortness of breath. 10/08/18   Eustaquio Maize, MD  Butalbital-APAP-Caffeine (FIORICET) 50-300-40 MG CAPS Take 1 capsule by mouth every 4 (four) hours as needed (headache). 06/18/19   Claretta Fraise, MD  cetirizine (ZYRTEC) 10 MG tablet Take 1 tablet (10 mg total) by mouth at bedtime. 08/05/19   Dettinger, Fransisca Kaufmann, MD  fluticasone (FLONASE) 50 MCG/ACT nasal spray Place 2 sprays into both nostrils daily. Patient taking differently: Place 2 sprays into both nostrils daily as needed for allergies or rhinitis.  05/26/16   Dettinger, Fransisca Kaufmann, MD  HYDROcodone-acetaminophen (NORCO/VICODIN) 5-325 MG tablet Take 1 tablet by mouth every 4 (four) hours as needed. 11/18/19   Evalee Jefferson, PA-C  metFORMIN (GLUCOPHAGE) 500 MG tablet Take 1 tablet (500 mg total) by mouth daily with breakfast. 08/05/19   Dettinger,  Elige Radon, MD  montelukast (SINGULAIR) 10 MG tablet Take 1 tablet (10 mg total) by mouth at bedtime. 08/05/19   Dettinger, Elige Radon, MD  norethindrone-ethinyl estradiol-iron (LARIN FE 1.5/30) 1.5-30 MG-MCG tablet Take 1 tablet by mouth daily. Skip inactive pills x 2 mo, starting new pack immediately, then take for third month 08/27/19   Dettinger, Elige Radon, MD  omeprazole (PRILOSEC) 20 MG capsule Take 1 capsule (20 mg total) by mouth daily. 08/05/19   Dettinger, Elige Radon, MD  omeprazole (PRILOSEC) 20 MG capsule Take 1 capsule (20 mg total) by mouth daily. 11/17/19   Triplett, Tammy, PA-C  ondansetron (ZOFRAN) 4 MG tablet Take 1 tablet (4 mg total) by mouth every 8 (eight) hours as needed. 07/04/19   Devoria Albe, MD  ondansetron (ZOFRAN) 4 MG tablet Take 1 tablet (4 mg total) by mouth every 6 (six) hours. As needed for nausea/vomiting 11/17/19   Triplett,  Tammy, PA-C  rizatriptan (MAXALT) 10 MG tablet TAKE 1 TABLET BY MOUTH AS NEEDED. MAY REPEAT IN TWO HOURS IF NEEDED 07/15/19   Dettinger, Elige Radon, MD  topiramate (TOPAMAX) 50 MG tablet Take 1 tablet (50 mg total) by mouth 2 (two) times daily. 08/05/19   Dettinger, Elige Radon, MD  Ubrogepant (UBRELVY) 50 MG TABS Take 50 mg by mouth as directed. Take at onset of headache, may repeat after 2 hours if headache still present. Max 2 tabs per day. 08/05/19   Dettinger, Elige Radon, MD    Allergies    Bee venom, Azithromycin, and Cephalosporins  Review of Systems   Review of Systems  Constitutional: Negative for fever.  HENT: Negative for congestion and sore throat.   Eyes: Negative.   Respiratory: Negative for chest tightness and shortness of breath.   Cardiovascular: Negative for chest pain.  Gastrointestinal: Positive for abdominal pain, nausea and vomiting.  Genitourinary: Negative.   Musculoskeletal: Negative for arthralgias, joint swelling and neck pain.  Skin: Negative.  Negative for rash and wound.  Neurological: Negative for dizziness, weakness, light-headedness, numbness and headaches.  Psychiatric/Behavioral: Negative.     Physical Exam Updated Vital Signs BP 116/68 (BP Location: Left Arm)   Pulse 99   Temp 98.7 F (37.1 C) (Oral)   Resp 17   LMP 11/11/2019   SpO2 100%   Physical Exam Vitals and nursing note reviewed.  Constitutional:      Appearance: She is well-developed.  HENT:     Head: Normocephalic and atraumatic.  Eyes:     Conjunctiva/sclera: Conjunctivae normal.  Cardiovascular:     Rate and Rhythm: Normal rate and regular rhythm.     Heart sounds: Normal heart sounds.  Pulmonary:     Effort: Pulmonary effort is normal.     Breath sounds: Normal breath sounds. No wheezing.  Abdominal:     General: Bowel sounds are normal.     Palpations: Abdomen is soft.     Tenderness: There is abdominal tenderness in the epigastric area. There is no guarding or rebound.    Musculoskeletal:        General: Normal range of motion.     Cervical back: Normal range of motion.  Skin:    General: Skin is warm and dry.  Neurological:     Mental Status: She is alert.     ED Results / Procedures / Treatments   Labs (all labs ordered are listed, but only abnormal results are displayed) Labs Reviewed  COMPREHENSIVE METABOLIC PANEL - Abnormal; Notable for the following components:  Result Value   Chloride 112 (*)    CO2 19 (*)    Glucose, Bld 106 (*)    Calcium 8.5 (*)    AST 53 (*)    ALT 49 (*)    All other components within normal limits  CBC WITH DIFFERENTIAL/PLATELET - Abnormal; Notable for the following components:   WBC 12.6 (*)    Platelets 417 (*)    Neutro Abs 9.5 (*)    All other components within normal limits  RESPIRATORY PANEL BY RT PCR (FLU A&B, COVID)  LIPASE, BLOOD  HIV ANTIBODY (ROUTINE TESTING W REFLEX)  CBC WITH DIFFERENTIAL/PLATELET  COMPREHENSIVE METABOLIC PANEL    EKG None  Radiology US Abdomen Limited  Result Date: 11/17/2019 CLINICAL DATA:  Right upper quadrant and epigastric pain EXAM: ULTRASOUND ABDOMEN LIMITED RIGHT UPPER QUADRANT COMPARISON:  Abdominal ultrasound 07/31/2019 FINDINGS: Gallbladder: There are multiple shadowing gallstones measuring up to 1.4 cm. No wall thickening visualized. No sonographic Murphy sign noted by sonographer. Common bile duct: Diameter: 0.4 cm, within normal limits Liver: Assessment somewhat limited by shadowing bowel gas. No focal lesion identified. Within normal limits in parenchymal echogenicity. Portal vein is patent on color Doppler imaging with normal direction of blood flow towards the liver. Other: None. IMPRESSION: Cholelithiasis without evidence of cholecystitis. Electronically Signed   By: Emmaline Kluver M.D.   On: 11/17/2019 12:04    Procedures Procedures (including critical care time)  Medications Ordered in ED Medications  enoxaparin (LOVENOX) injection 40 mg (has no  administration in time range)  lactated ringers infusion (has no administration in time range)  oxyCODONE (Oxy IR/ROXICODONE) immediate release tablet 5-10 mg (has no administration in time range)  morphine 2 MG/ML injection 2 mg (has no administration in time range)  diphenhydrAMINE (BENADRYL) 12.5 MG/5ML elixir 12.5 mg (has no administration in time range)    Or  diphenhydrAMINE (BENADRYL) injection 12.5 mg (has no administration in time range)  ondansetron (ZOFRAN-ODT) disintegrating tablet 4 mg (has no administration in time range)    Or  ondansetron (ZOFRAN) injection 4 mg (has no administration in time range)  simethicone (MYLICON) chewable tablet 40 mg (has no administration in time range)  metoprolol tartrate (LOPRESSOR) injection 5 mg (has no administration in time range)  ondansetron (ZOFRAN) injection 4 mg (4 mg Intravenous Given 11/17/19 2335)  morphine 4 MG/ML injection 4 mg (4 mg Intravenous Given 11/17/19 2335)    ED Course  I have reviewed the triage vital signs and the nursing notes.  Pertinent labs & imaging results that were available during my care of the patient were reviewed by me and considered in my medical decision making (see chart for details).    MDM Rules/Calculators/A&P                      Patient with her second visit today for evaluation of known gallbladder disease.  Worsened pain this evening which has improved after receiving IV morphine.  Labs were repeated and her LFTs are slightly elevated above this morning's findings with her AST at 53 and her ALT at 49, her lipase is normal.  She is currently pain-free after receiving IV morphine.  Discussed with Dr. Henreitta Leber who will plan hospital admission, anticipating surgery tomorrow.  Discussed plan with pt who is agreeable to stay, thankful to have this problem resolved.    Final Clinical Impression(s) / ED Diagnoses Final diagnoses:  Calculus of gallbladder without cholecystitis without obstruction     Rx /  DC Orders ED Discharge Orders         Ordered    HYDROcodone-acetaminophen (NORCO/VICODIN) 5-325 MG tablet  Every 4 hours PRN     11/18/19 0048           Burgess Amor, PA-C 11/18/19 Lajoyce Lauber, MD 11/18/19 [phone]

## 2019-11-17 NOTE — ED Triage Notes (Signed)
Pt reports waking with upper abd pain. Hx of gallstones and has not had surgical eval.

## 2019-11-17 NOTE — Discharge Instructions (Signed)
Your ultrasound today confirms that you have gallstones.  This can cause intermittent episodes of pain to your stomach and nausea and/or vomiting.  Your condition can be aggravated by certain foods.  Try to avoid alcohol, spicy or greasy foods.  Follow-up with your primary doctor for recheck or you can contact the surgeon listed to arrange a follow-up appointment.  Return to the emergency department if you develop worsening abdominal pain with fever and/or vomiting.

## 2019-11-17 NOTE — ED Notes (Signed)
Pt was informed that we need a urine sample. Pt states that they can not urinate at this time.

## 2019-11-18 ENCOUNTER — Encounter (HOSPITAL_COMMUNITY)
Admission: EM | Disposition: A | Discharge status: Home / Self Care | Payer: Self-pay | Source: Home / Self Care | Attending: Emergency Medicine

## 2019-11-18 ENCOUNTER — Observation Stay (HOSPITAL_COMMUNITY): Payer: PRIVATE HEALTH INSURANCE | Admitting: Anesthesiology

## 2019-11-18 ENCOUNTER — Encounter (HOSPITAL_COMMUNITY): Payer: Self-pay | Admitting: General Surgery

## 2019-11-18 DIAGNOSIS — K8020 Calculus of gallbladder without cholecystitis without obstruction: Secondary | ICD-10-CM | POA: Diagnosis present

## 2019-11-18 DIAGNOSIS — K8000 Calculus of gallbladder with acute cholecystitis without obstruction: Secondary | ICD-10-CM | POA: Diagnosis not present

## 2019-11-18 HISTORY — PX: CHOLECYSTECTOMY: SHX55

## 2019-11-18 LAB — RESPIRATORY PANEL BY RT PCR (FLU A&B, COVID)
Influenza A by PCR: NEGATIVE
Influenza B by PCR: NEGATIVE
SARS Coronavirus 2 by RT PCR: NEGATIVE

## 2019-11-18 LAB — CBC WITH DIFFERENTIAL/PLATELET
Abs Immature Granulocytes: 0.04 10*3/uL (ref 0.00–0.07)
Basophils Absolute: 0 10*3/uL (ref 0.0–0.1)
Basophils Relative: 0 %
Eosinophils Absolute: 0.1 10*3/uL (ref 0.0–0.5)
Eosinophils Relative: 1 %
HCT: 38.5 % (ref 36.0–46.0)
Hemoglobin: 12.1 g/dL (ref 12.0–15.0)
Immature Granulocytes: 0 %
Lymphocytes Relative: 23 %
Lymphs Abs: 2.3 10*3/uL (ref 0.7–4.0)
MCH: 28.6 pg (ref 26.0–34.0)
MCHC: 31.4 g/dL (ref 30.0–36.0)
MCV: 91 fL (ref 80.0–100.0)
Monocytes Absolute: 0.6 10*3/uL (ref 0.1–1.0)
Monocytes Relative: 6 %
Neutro Abs: 7.2 10*3/uL (ref 1.7–7.7)
Neutrophils Relative %: 70 %
Platelets: 395 10*3/uL (ref 150–400)
RBC: 4.23 MIL/uL (ref 3.87–5.11)
RDW: 13.6 % (ref 11.5–15.5)
WBC: 10.2 10*3/uL (ref 4.0–10.5)
nRBC: 0 % (ref 0.0–0.2)

## 2019-11-18 LAB — COMPREHENSIVE METABOLIC PANEL
ALT: 49 U/L — ABNORMAL HIGH (ref 0–44)
ALT: 55 U/L — ABNORMAL HIGH (ref 0–44)
AST: 36 U/L (ref 15–41)
AST: 53 U/L — ABNORMAL HIGH (ref 15–41)
Albumin: 3.2 g/dL — ABNORMAL LOW (ref 3.5–5.0)
Albumin: 3.5 g/dL (ref 3.5–5.0)
Alkaline Phosphatase: 77 U/L (ref 38–126)
Alkaline Phosphatase: 86 U/L (ref 38–126)
Anion gap: 8 (ref 5–15)
Anion gap: 8 (ref 5–15)
BUN: 11 mg/dL (ref 6–20)
BUN: 9 mg/dL (ref 6–20)
CO2: 19 mmol/L — ABNORMAL LOW (ref 22–32)
CO2: 21 mmol/L — ABNORMAL LOW (ref 22–32)
Calcium: 8.4 mg/dL — ABNORMAL LOW (ref 8.9–10.3)
Calcium: 8.5 mg/dL — ABNORMAL LOW (ref 8.9–10.3)
Chloride: 111 mmol/L (ref 98–111)
Chloride: 112 mmol/L — ABNORMAL HIGH (ref 98–111)
Creatinine, Ser: 0.5 mg/dL (ref 0.44–1.00)
Creatinine, Ser: 0.58 mg/dL (ref 0.44–1.00)
GFR calc Af Amer: >60 mL/min (ref >60)
GFR calc Af Amer: >60 mL/min (ref >60)
GFR calc non Af Amer: >60 mL/min (ref >60)
GFR calc non Af Amer: >60 mL/min (ref >60)
Glucose, Bld: 106 mg/dL — ABNORMAL HIGH (ref 70–99)
Glucose, Bld: 97 mg/dL (ref 70–99)
Potassium: 3.5 mmol/L (ref 3.5–5.1)
Potassium: 3.6 mmol/L (ref 3.5–5.1)
Sodium: 139 mmol/L (ref 135–145)
Sodium: 140 mmol/L (ref 135–145)
Total Bilirubin: 0.2 mg/dL — ABNORMAL LOW (ref 0.3–1.2)
Total Bilirubin: 0.4 mg/dL (ref 0.3–1.2)
Total Protein: 6.3 g/dL — ABNORMAL LOW (ref 6.5–8.1)
Total Protein: 6.8 g/dL (ref 6.5–8.1)

## 2019-11-18 LAB — GLUCOSE, CAPILLARY
Glucose-Capillary: 78 mg/dL (ref 70–99)
Glucose-Capillary: 102 mg/dL — ABNORMAL HIGH (ref 70–99)

## 2019-11-18 LAB — URINE CULTURE

## 2019-11-18 LAB — HIV ANTIBODY (ROUTINE TESTING W REFLEX): HIV Screen 4th Generation wRfx: NONREACTIVE

## 2019-11-18 LAB — LIPASE, BLOOD: Lipase: 34 U/L (ref 11–51)

## 2019-11-18 LAB — MRSA PCR SCREENING: MRSA by PCR: NEGATIVE

## 2019-11-18 SURGERY — LAPAROSCOPIC CHOLECYSTECTOMY
Anesthesia: General | Site: Abdomen

## 2019-11-18 MED ORDER — MIDAZOLAM HCL 2 MG/2ML IJ SOLN
INTRAMUSCULAR | Status: AC
  Filled 2019-11-18: qty 2

## 2019-11-18 MED ORDER — SUCCINYLCHOLINE CHLORIDE 20 MG/ML IJ SOLN
INTRAMUSCULAR | Status: DC | PRN
  Administered 2019-11-18: 120 mg via INTRAVENOUS

## 2019-11-18 MED ORDER — ONDANSETRON HCL 4 MG/2ML IJ SOLN
INTRAMUSCULAR | Status: DC | PRN
  Administered 2019-11-18: 4 mg via INTRAVENOUS

## 2019-11-18 MED ORDER — CHLORHEXIDINE GLUCONATE CLOTH 2 % EX PADS: 6.0000 | MEDICATED_PAD | Freq: Once | CUTANEOUS | Status: DC

## 2019-11-18 MED ORDER — LIDOCAINE 2% (20 MG/ML) 5 ML SYRINGE
INTRAMUSCULAR | Status: AC
  Filled 2019-11-18: qty 5

## 2019-11-18 MED ORDER — LACTATED RINGERS IV SOLN: INTRAVENOUS | Status: DC

## 2019-11-18 MED ORDER — SODIUM CHLORIDE 0.9 % IR SOLN
Status: DC | PRN
  Administered 2019-11-18: 3000 mL
  Administered 2019-11-18: 1000 mL

## 2019-11-18 MED ORDER — MIDAZOLAM HCL 2 MG/2ML IJ SOLN: 0.5000 mg | Freq: Once | INTRAMUSCULAR | Status: DC | PRN

## 2019-11-18 MED ORDER — HYDROMORPHONE HCL 1 MG/ML IJ SOLN
INTRAMUSCULAR | Status: AC
  Filled 2019-11-18: qty 1

## 2019-11-18 MED ORDER — DEXAMETHASONE SODIUM PHOSPHATE 10 MG/ML IJ SOLN
INTRAMUSCULAR | Status: DC | PRN
  Administered 2019-11-18: 10 mg via INTRAVENOUS

## 2019-11-18 MED ORDER — ONDANSETRON 4 MG PO TBDP: 4.0000 mg | ORAL_TABLET | Freq: Four times a day (QID) | ORAL | Status: DC | PRN

## 2019-11-18 MED ORDER — BUPIVACAINE HCL (PF) 0.5 % IJ SOLN
INTRAMUSCULAR | Status: DC | PRN
  Administered 2019-11-18: 10 mL

## 2019-11-18 MED ORDER — LIDOCAINE HCL URETHRAL/MUCOSAL 2 % EX GEL: CUTANEOUS | Status: DC | PRN

## 2019-11-18 MED ORDER — BUPIVACAINE HCL (PF) 0.5 % IJ SOLN
INTRAMUSCULAR | Status: AC
  Filled 2019-11-18: qty 30

## 2019-11-18 MED ORDER — HEMOSTATIC AGENTS (NO CHARGE) OPTIME
TOPICAL | Status: DC | PRN
  Administered 2019-11-18: 1 via TOPICAL

## 2019-11-18 MED ORDER — ROCURONIUM BROMIDE 100 MG/10ML IV SOLN
INTRAVENOUS | Status: DC | PRN
  Administered 2019-11-18: 50 mg via INTRAVENOUS

## 2019-11-18 MED ORDER — OXYCODONE HCL 5 MG PO TABS
5.0000 mg | ORAL_TABLET | ORAL | Status: DC | PRN
  Administered 2019-11-18 – 2019-11-19 (×3): 5 mg via ORAL
  Filled 2019-11-18 (×3): qty 1

## 2019-11-18 MED ORDER — DIPHENHYDRAMINE HCL 50 MG/ML IJ SOLN: 12.5000 mg | Freq: Four times a day (QID) | INTRAMUSCULAR | Status: DC | PRN

## 2019-11-18 MED ORDER — HYDROMORPHONE HCL 1 MG/ML IJ SOLN
0.2500 mg | INTRAMUSCULAR | Status: DC | PRN
  Administered 2019-11-18 (×2): 0.5 mg via INTRAVENOUS

## 2019-11-18 MED ORDER — MIDAZOLAM HCL 5 MG/5ML IJ SOLN
INTRAMUSCULAR | Status: DC | PRN
  Administered 2019-11-18: 2 mg via INTRAVENOUS

## 2019-11-18 MED ORDER — KETOROLAC TROMETHAMINE 30 MG/ML IJ SOLN
30.0000 mg | Freq: Once | INTRAMUSCULAR | Status: AC
  Administered 2019-11-18: 30 mg via INTRAVENOUS

## 2019-11-18 MED ORDER — MORPHINE SULFATE (PF) 2 MG/ML IV SOLN
2.0000 mg | INTRAVENOUS | Status: DC | PRN
  Administered 2019-11-18: 16:00:00 2 mg via INTRAVENOUS
  Filled 2019-11-18: qty 1

## 2019-11-18 MED ORDER — FENTANYL CITRATE (PF) 250 MCG/5ML IJ SOLN
INTRAMUSCULAR | Status: AC
  Filled 2019-11-18: qty 5

## 2019-11-18 MED ORDER — PROPOFOL 10 MG/ML IV BOLUS
INTRAVENOUS | Status: DC | PRN
  Administered 2019-11-18: 200 mg via INTRAVENOUS

## 2019-11-18 MED ORDER — PROPOFOL 10 MG/ML IV BOLUS
INTRAVENOUS | Status: AC
  Filled 2019-11-18: qty 20

## 2019-11-18 MED ORDER — DEXAMETHASONE SODIUM PHOSPHATE 10 MG/ML IJ SOLN
INTRAMUSCULAR | Status: AC
  Filled 2019-11-18: qty 1

## 2019-11-18 MED ORDER — PROMETHAZINE HCL 25 MG/ML IJ SOLN
6.2500 mg | INTRAMUSCULAR | Status: DC | PRN
  Administered 2019-11-18: 14:00:00 12.5 mg via INTRAVENOUS
  Filled 2019-11-18: qty 1

## 2019-11-18 MED ORDER — CIPROFLOXACIN IN D5W 400 MG/200ML IV SOLN
400.0000 mg | INTRAVENOUS | Status: AC
  Administered 2019-11-18: 400 mg via INTRAVENOUS
  Filled 2019-11-18: qty 200

## 2019-11-18 MED ORDER — SIMETHICONE 80 MG PO CHEW: 40.0000 mg | CHEWABLE_TABLET | Freq: Four times a day (QID) | ORAL | Status: DC | PRN

## 2019-11-18 MED ORDER — KETOROLAC TROMETHAMINE 30 MG/ML IJ SOLN
INTRAMUSCULAR | Status: AC
  Filled 2019-11-18: qty 1

## 2019-11-18 MED ORDER — ONDANSETRON HCL 4 MG/2ML IJ SOLN: 4.0000 mg | Freq: Four times a day (QID) | INTRAMUSCULAR | Status: DC | PRN

## 2019-11-18 MED ORDER — ENOXAPARIN SODIUM 40 MG/0.4ML ~~LOC~~ SOLN
40.0000 mg | SUBCUTANEOUS | Status: DC
  Administered 2019-11-18: 22:00:00 40 mg via SUBCUTANEOUS
  Filled 2019-11-18: qty 0.4

## 2019-11-18 MED ORDER — FENTANYL CITRATE (PF) 100 MCG/2ML IJ SOLN
INTRAMUSCULAR | Status: DC | PRN
  Administered 2019-11-18: 100 ug via INTRAVENOUS
  Administered 2019-11-18: 50 ug via INTRAVENOUS
  Administered 2019-11-18: 100 ug via INTRAVENOUS

## 2019-11-18 MED ORDER — METOPROLOL TARTRATE 5 MG/5ML IV SOLN: 5.0000 mg | Freq: Four times a day (QID) | INTRAVENOUS | Status: DC | PRN

## 2019-11-18 MED ORDER — DIPHENHYDRAMINE HCL 12.5 MG/5ML PO ELIX: 12.5000 mg | ORAL_SOLUTION | Freq: Four times a day (QID) | ORAL | Status: DC | PRN

## 2019-11-18 MED ORDER — HYDROCODONE-ACETAMINOPHEN 7.5-325 MG PO TABS: 1.0000 | ORAL_TABLET | Freq: Once | ORAL | Status: DC | PRN

## 2019-11-18 MED ORDER — ONDANSETRON HCL 4 MG/2ML IJ SOLN
INTRAMUSCULAR | Status: AC
  Filled 2019-11-18: qty 2

## 2019-11-18 MED ORDER — HYDROCODONE-ACETAMINOPHEN 5-325 MG PO TABS: 1.0000 | ORAL_TABLET | ORAL | 0 refills | Status: DC | PRN

## 2019-11-18 MED ORDER — SUCCINYLCHOLINE CHLORIDE 200 MG/10ML IV SOSY
PREFILLED_SYRINGE | INTRAVENOUS | Status: DC | PRN
  Administered 2019-11-18: 200 mg via INTRAVENOUS

## 2019-11-18 SURGICAL SUPPLY — 47 items
APPLIER CLIP ROT 10 11.4 M/L (STAPLE) ×3
BAG RETRIEVAL 10 (BASKET) ×1
BAG RETRIEVAL 10MM (BASKET) ×1
BLADE SURG 15 STRL LF DISP TIS (BLADE) ×1 IMPLANT
BLADE SURG 15 STRL SS (BLADE) ×2
CHLORAPREP W/TINT 26 (MISCELLANEOUS) ×3 IMPLANT
CLIP APPLIE ROT 10 11.4 M/L (STAPLE) ×1 IMPLANT
CLOTH BEACON ORANGE TIMEOUT ST (SAFETY) ×3 IMPLANT
COVER LIGHT HANDLE STERIS (MISCELLANEOUS) ×6 IMPLANT
COVER WAND RF STERILE (DRAPES) ×3 IMPLANT
DECANTER SPIKE VIAL GLASS SM (MISCELLANEOUS) ×3 IMPLANT
DERMABOND ADVANCED (GAUZE/BANDAGES/DRESSINGS) ×2
DERMABOND ADVANCED .7 DNX12 (GAUZE/BANDAGES/DRESSINGS) ×1 IMPLANT
ELECT REM PT RETURN 9FT ADLT (ELECTROSURGICAL) ×3
ELECTRODE REM PT RTRN 9FT ADLT (ELECTROSURGICAL) ×1 IMPLANT
GLOVE BIO SURGEON STRL SZ 6.5 (GLOVE) ×2 IMPLANT
GLOVE BIO SURGEONS STRL SZ 6.5 (GLOVE) ×1
GLOVE BIOGEL PI IND STRL 6.5 (GLOVE) ×1 IMPLANT
GLOVE BIOGEL PI IND STRL 7.0 (GLOVE) ×3 IMPLANT
GLOVE BIOGEL PI INDICATOR 6.5 (GLOVE) ×2
GLOVE BIOGEL PI INDICATOR 7.0 (GLOVE) ×6
GLOVE ECLIPSE 6.5 STRL STRAW (GLOVE) ×3 IMPLANT
GLOVE SURG SS PI 7.5 STRL IVOR (GLOVE) ×3 IMPLANT
GOWN STRL REUS W/TWL LRG LVL3 (GOWN DISPOSABLE) ×9 IMPLANT
HEMOSTAT SNOW SURGICEL 2X4 (HEMOSTASIS) ×3 IMPLANT
INST SET LAPROSCOPIC AP (KITS) ×3 IMPLANT
IV NS IRRIG 3000ML ARTHROMATIC (IV SOLUTION) ×3 IMPLANT
KIT TURNOVER KIT A (KITS) ×3 IMPLANT
MANIFOLD NEPTUNE II (INSTRUMENTS) ×3 IMPLANT
NEEDLE INSUFFLATION 14GA 120MM (NEEDLE) ×3 IMPLANT
NS IRRIG 1000ML POUR BTL (IV SOLUTION) ×3 IMPLANT
PACK LAP CHOLE LZT030E (CUSTOM PROCEDURE TRAY) ×3 IMPLANT
PAD ARMBOARD 7.5X6 YLW CONV (MISCELLANEOUS) ×3 IMPLANT
SET BASIN LINEN APH (SET/KITS/TRAYS/PACK) ×3 IMPLANT
SET TUBE IRRIG SUCTION NO TIP (IRRIGATION / IRRIGATOR) ×3 IMPLANT
SET TUBE SMOKE EVAC HIGH FLOW (TUBING) ×3 IMPLANT
SLEEVE ENDOPATH XCEL 5M (ENDOMECHANICALS) ×3 IMPLANT
SUT MNCRL AB 4-0 PS2 18 (SUTURE) ×3 IMPLANT
SUT VICRYL 0 UR6 27IN ABS (SUTURE) ×3 IMPLANT
SYS BAG RETRIEVAL 10MM (BASKET) ×1
SYSTEM BAG RETRIEVAL 10MM (BASKET) ×1 IMPLANT
TROCAR ENDO BLADELESS 11MM (ENDOMECHANICALS) ×3 IMPLANT
TROCAR XCEL NON-BLD 5MMX100MML (ENDOMECHANICALS) ×3 IMPLANT
TROCAR XCEL UNIV SLVE 11M 100M (ENDOMECHANICALS) ×3 IMPLANT
TUBE CONNECTING 12'X1/4 (SUCTIONS) ×1
TUBE CONNECTING 12X1/4 (SUCTIONS) ×2 IMPLANT
WARMER LAPAROSCOPE (MISCELLANEOUS) ×3 IMPLANT

## 2019-11-18 NOTE — Anesthesia Preprocedure Evaluation (Signed)
Anesthesia Evaluation  Patient identified by MRN, date of birth, ID band Patient awake    Reviewed: Allergy & Precautions, NPO status , Patient's Chart, lab work & pertinent test results  Airway Mallampati: I  TM Distance: >3 FB Neck ROM: Full    Dental no notable dental hx. (+) Teeth Intact   Pulmonary asthma ,  States no recent inhaler use    Pulmonary exam normal breath sounds clear to auscultation       Cardiovascular Exercise Tolerance: Good negative cardio ROS Normal cardiovascular examI Rhythm:Regular Rate:Normal     Neuro/Psych  Headaches, PSYCHIATRIC DISORDERS Depression    GI/Hepatic Neg liver ROS, GERD  Medicated and Controlled,  Endo/Other  diabetes, Well Controlled, Type 2, Oral Hypoglycemic Agents  Renal/GU negative Renal ROS  negative genitourinary   Musculoskeletal  (+) Arthritis , Osteoarthritis,    Abdominal   Peds negative pediatric ROS (+)  Hematology negative hematology ROS (+)   Anesthesia Other Findings   Reproductive/Obstetrics negative OB ROS                             Anesthesia Physical Anesthesia Plan  ASA: II  Anesthesia Plan: General   Post-op Pain Management:    Induction: Intravenous  PONV Risk Score and Plan: 3 and Ondansetron, Dexamethasone, Treatment may vary due to age or medical condition and Midazolam  Airway Management Planned: Oral ETT  Additional Equipment:   Intra-op Plan:   Post-operative Plan: Extubation in OR  Informed Consent: I have reviewed the patients History and Physical, chart, labs and discussed the procedure including the risks, benefits and alternatives for the proposed anesthesia with the patient or authorized representative who has indicated his/her understanding and acceptance.     Dental advisory given  Plan Discussed with: CRNA  Anesthesia Plan Comments: (Plan Full PPE use Plan GETA D/W PT -WTP with same  after Q&A)        Anesthesia Quick Evaluation

## 2019-11-18 NOTE — Plan of Care (Signed)
  Problem: Education: Goal: Knowledge of General Education information will improve Description: Including pain rating scale, medication(s)/side effects and non-pharmacologic comfort measures Outcome: Progressing   Problem: Clinical Measurements: Goal: Ability to maintain clinical measurements within normal limits will improve Outcome: Progressing Goal: Will remain free from infection Outcome: Progressing   Problem: Nutrition: Goal: Adequate nutrition will be maintained Outcome: Progressing   Problem: Pain Managment: Goal: General experience of comfort will improve Outcome: Progressing   Problem: Safety: Goal: Ability to remain free from injury will improve Outcome: Progressing   Problem: Skin Integrity: Goal: Risk for impaired skin integrity will decrease Outcome: Progressing   

## 2019-11-18 NOTE — Progress Notes (Signed)
[facility]  Notified mom of the lap chole. Patient did well. Looks like acute cholecystitis. Will see if she wants to go home versus staying overnight for pain control.   Algis Greenhouse, MD [facility] [address] [place], [place] [address] [phone]/ [phone] (office)

## 2019-11-18 NOTE — Progress Notes (Signed)
Notified surgeon on inability to do surgical pcr.  Surgical pcr cannot be done because of patient age (<22).  Patient needs a nasopharyngeal culture, and the swab for culture is not at this campus. No new orders received form surgeon pertaining to surgical pcr.

## 2019-11-18 NOTE — Op Note (Signed)
Operative Note   Preoperative Diagnosis: Symptomatic cholelithiasis   Postoperative Diagnosis: Acute Cholecystitis    Procedure(s) Performed: Laparoscopic cholecystectomy   Surgeon: Lillia Abed C. Henreitta Leber, MD   Assistant: Franky Macho, MD    Anesthesia: General endotracheal   Anesthesiologist: Dorena Cookey, MD    Specimens: Gallbladder    Estimated Blood Loss: Minimal    Blood Replacement: None    Complications: None    Operative Findings:  Inflamed gallbladder with stones   Procedure: The patient was taken to the operating room and placed supine. General endotracheal anesthesia was induced. Intravenous antibiotics were administered per protocol. An orogastric tube positioned to decompress the stomach. The abdomen was prepared and draped in the usual sterile fashion.    A supraumbilical incision was made and a Veress technique was utilized to achieve pneumoperitoneum to 15 mmHg with carbon dioxide. A 11 mm optiview port was placed through the supraumbilical region, and a 10 mm 0-degree operative laparoscope was introduced. The area underlying the trocar and Veress needle were inspected and without evidence of injury.  Remaining trocars were placed under direct vision. Two 5 mm ports were placed in the right abdomen, between the anterior axillary and midclavicular line.  A final 11 mm port was placed through the mid-epigastrium, near the falciform ligament.    The gallbladder fundus was elevated cephalad and the infundibulum was retracted to the patient's right.  The gallbladder had inflammation and a rind that was retracting it downward. This was cleared with the fatty tissue.  With care the medial and lateral walls were mobilized to help aid with retraction with electrocautery. The gallbladder wall was accidentally entered and there was some spillage of bile and stones but it was contained.  With continued mobilization laterally, the gallbladder/cystic duct junction was skeletonized on  the lateral/ posterior aspect.  A window as made behind the gallbladder at the hepatic bed.  Anteriorly the cystic duct and artery were noted running right together, and these were skeletonized.  The cystic artery noted in the triangle of Calot. Dissection was carried out until the critical view of safety was achieved.    The cystic duct and cystic artery were doubly clipped and divided. A small posterior vessel was clipped and divided.  The gallbladder was then dissected from the remaining liver bed with electrocautery. The specimen was placed in an Endopouch and was retrieved through the epigastric site.   Final inspection revealed acceptable hemostasis. Surgical Jamelle Haring was placed in the gallbladder bed. Trocars were removed and pneumoperitoneum was released.  The umbilical and epigastric sites were smaller than my finger and over the rib cage. Skin incisions were closed with 4-0 Monocryl subcuticular sutures and Dermabond. The patient was awakened from anesthesia and extubated without complication.    Algis Greenhouse, MD [facility] [address] [place] [place], [place] [address] [phone] (office)

## 2019-11-18 NOTE — Progress Notes (Signed)
[facility]  Patient with pain and nausea/vomiting with stones. No obvious cholecystitis but is back for the second time in 24 hrs. Will plan for lap chole in the AM. Will meet patient in the AM and get consent.  Rapid COVID for OR today Monday 2/15.    Algis Greenhouse, MD [facility] [address] [place], [place] [address] [phone]/ [phone] (office)

## 2019-11-18 NOTE — Anesthesia Postprocedure Evaluation (Signed)
Anesthesia Post Note  Patient: Ruth King  Procedure(s) Performed: LAPAROSCOPIC CHOLECYSTECTOMY (N/A Abdomen)  Patient location during evaluation: PACU Anesthesia Type: General Level of consciousness: awake, oriented, awake and alert and patient cooperative Pain management: pain level controlled Vital Signs Assessment: post-procedure vital signs reviewed and stable Respiratory status: spontaneous breathing, respiratory function stable, nonlabored ventilation and patient connected to face mask oxygen Cardiovascular status: stable Postop Assessment: no apparent nausea or vomiting Anesthetic complications: no     Last Vitals:  Vitals:   11/18/19 1257 11/18/19 1300  BP: (!) 125/99 130/76  Pulse: 99 85  Resp: (!) 21 16  Temp:  (P) 36.8 C  SpO2: 92% 90%    Last Pain:  Vitals:   11/18/19 1036  TempSrc: Oral  PainSc: 2                  Shaletta Hinostroza

## 2019-11-18 NOTE — Transfer of Care (Signed)
Immediate Anesthesia Transfer of Care Note  Patient: Ruth King  Procedure(s) Performed: LAPAROSCOPIC CHOLECYSTECTOMY (N/A Abdomen)  Patient Location: PACU  Anesthesia Type:General  Level of Consciousness: awake, alert , oriented and patient cooperative  Airway & Oxygen Therapy: Patient Spontanous Breathing and Patient connected to face mask oxygen  Post-op Assessment: Report given to RN, Post -op Vital signs reviewed and stable and Patient moving all extremities X 4  Post vital signs: Reviewed and stable  Last Vitals:  Vitals Value Taken Time  BP 130/76 11/18/19 1300  Temp    Pulse 86 11/18/19 1302  Resp 14 11/18/19 1302  SpO2 89 % 11/18/19 1302  Vitals shown include unvalidated device data.  Last Pain:  Vitals:   11/18/19 1036  TempSrc: Oral  PainSc: 2       Patients Stated Pain Goal: 4 (11/18/19 1036)  Complications: No apparent anesthesia complications

## 2019-11-18 NOTE — H&P (Signed)
[facility] History and Physical  Reason for Referral: Symptomatic cholelithiasis with nausea/vomiting  Referring Physician:  Evalee Jefferson PA (ED)  Chief Complaint    Abdominal Pain      Ruth King is a 19 y.o. female.  HPI: Ruth King is a 19 yo who presented to the ED twice yesterday with abdominal pain in the RUQ and nausea/vomiting. Ruth King was sent home the first time and came back a few hours later after another episode of pain. The pain is now constant in the RUQ and cramping. Ruth King says that Ruth King tried to eat broth and the pain worsened. Ruth King also had an episode of vomiting. Her workup in the first visit demonstrated gallstones and no signs of cholecystitis. Ruth King did have a mild leukocytosis on return to the ED.  Ruth King says Ruth King has had some gallstone episodes in the past.  Reporting possibly 2 other episodes but not requiring ED visits.    Past Medical History:  Diagnosis Date  . Asthma   . Nausea & vomiting     Past Surgical History:  Procedure Laterality Date  . ADENOIDECTOMY    . APPENDECTOMY    . tubes in ears      Family History  Problem Relation Age of Onset  . Asthma Mother   . Gout Father   . Seizures Maternal Grandmother     Social History   Tobacco Use  . Smoking status: Never Smoker  . Smokeless tobacco: Never Used  Substance Use Topics  . Alcohol use: No  . Drug use: No     Medications:  I have reviewed the patient's current medications. Prior to Admission:  Medications Prior to Admission  Medication Sig Dispense Refill Last Dose  . albuterol (PROVENTIL HFA;VENTOLIN HFA) 108 (90 Base) MCG/ACT inhaler Inhale 2 puffs into the lungs every 6 (six) hours as needed for wheezing or shortness of breath. 2 Inhaler 3   . Butalbital-APAP-Caffeine (FIORICET) 50-300-40 MG CAPS Take 1 capsule by mouth every 4 (four) hours as needed (headache). 20 capsule 0   . cetirizine (ZYRTEC) 10 MG tablet Take 1 tablet (10 mg total) by mouth at bedtime. 90  tablet 3   . fluticasone (FLONASE) 50 MCG/ACT nasal spray Place 2 sprays into both nostrils daily. (Patient taking differently: Place 2 sprays into both nostrils daily as needed for allergies or rhinitis. ) 16 g 6   . metFORMIN (GLUCOPHAGE) 500 MG tablet Take 1 tablet (500 mg total) by mouth daily with breakfast. 90 tablet 3   . montelukast (SINGULAIR) 10 MG tablet Take 1 tablet (10 mg total) by mouth at bedtime. 90 tablet 3   . norethindrone-ethinyl estradiol-iron (LARIN FE 1.5/30) 1.5-30 MG-MCG tablet Take 1 tablet by mouth daily. Skip inactive pills x 2 mo, starting new pack immediately, then take for third month 84 tablet 3   . omeprazole (PRILOSEC) 20 MG capsule Take 1 capsule (20 mg total) by mouth daily. 90 capsule 3   . omeprazole (PRILOSEC) 20 MG capsule Take 1 capsule (20 mg total) by mouth daily. 30 capsule 1   . ondansetron (ZOFRAN) 4 MG tablet Take 1 tablet (4 mg total) by mouth every 8 (eight) hours as needed. 6 tablet 0   . ondansetron (ZOFRAN) 4 MG tablet Take 1 tablet (4 mg total) by mouth every 6 (six) hours. As needed for nausea/vomiting 12 tablet 0   . rizatriptan (MAXALT) 10 MG tablet TAKE 1 TABLET BY MOUTH AS NEEDED. MAY REPEAT IN TWO HOURS  IF NEEDED 10 tablet 2   . topiramate (TOPAMAX) 50 MG tablet Take 1 tablet (50 mg total) by mouth 2 (two) times daily. 180 tablet 3   . Ubrogepant (UBRELVY) 50 MG TABS Take 50 mg by mouth as directed. Take at onset of headache, may repeat after 2 hours if headache still present. Max 2 tabs per day. 30 tablet 5    Scheduled: . enoxaparin (LOVENOX) injection  40 mg Subcutaneous Q24H   Continuous: . lactated ringers     SHF:WYOVZCHYIFOYDXA **OR** diphenhydrAMINE, metoprolol tartrate, morphine injection, ondansetron **OR** ondansetron (ZOFRAN) IV, oxyCODONE, simethicone  Allergies  Allergen Reactions  . Bee Venom Shortness Of Breath  . Azithromycin Other (See Comments)    Not effective   . Cephalosporins Hives     ROS:  A  comprehensive review of systems was negative except for: Gastrointestinal: positive for abdominal pain, nausea and vomiting  Blood pressure 119/69, pulse 78, temperature 98.1 F (36.7 C), temperature source Oral, resp. rate 16, last menstrual period 11/11/2019, SpO2 100 %. Physical Exam Vitals reviewed.  Constitutional:      Appearance: Ruth King is well-developed.  HENT:     Head: Normocephalic and atraumatic.  Eyes:     Extraocular Movements: Extraocular movements intact.  Cardiovascular:     Rate and Rhythm: Normal rate and regular rhythm.  Pulmonary:     Effort: Pulmonary effort is normal.     Breath sounds: Normal breath sounds.  Abdominal:     General: There is no distension.     Palpations: Abdomen is soft.     Tenderness: There is abdominal tenderness in the right upper quadrant.     Comments: Lower right quadrant incision from appendectomy  Skin:    General: Skin is warm and dry.  Neurological:     General: No focal deficit present.     Mental Status: Ruth King is alert and oriented to person, place, and time.  Psychiatric:        Mood and Affect: Mood normal.        Behavior: Behavior normal.     Results: Results for orders placed or performed during the hospital encounter of 11/17/19 (from the past 48 hour(s))  Comprehensive metabolic panel     Status: Abnormal   Collection Time: 11/17/19 11:33 PM  Result Value Ref Range   Sodium 139 135 - 145 mmol/L   Potassium 3.5 3.5 - 5.1 mmol/L   Chloride 112 (H) 98 - 111 mmol/L   CO2 19 (L) 22 - 32 mmol/L   Glucose, Bld 106 (H) 70 - 99 mg/dL   BUN 9 6 - 20 mg/dL   Creatinine, Ser 1.28 0.44 - 1.00 mg/dL   Calcium 8.5 (L) 8.9 - 10.3 mg/dL   Total Protein 6.8 6.5 - 8.1 g/dL   Albumin 3.5 3.5 - 5.0 g/dL   AST 53 (H) 15 - 41 U/L   ALT 49 (H) 0 - 44 U/L   Alkaline Phosphatase 86 38 - 126 U/L   Total Bilirubin 0.4 0.3 - 1.2 mg/dL   GFR calc non Af Amer >60 >60 mL/min   GFR calc Af Amer >60 >60 mL/min   Anion gap 8 5 - 15     Comment: Performed at [facility], [address] [place], [place] [address]  Lipase, blood     Status: None   Collection Time: 11/17/19 11:33 PM  Result Value Ref Range   Lipase 34 11 - 51 U/L    Comment: Performed at [facility]  [facility], [address], [place], [place] [address]  CBC with Diff     Status: Abnormal   Collection Time: 11/17/19 11:33 PM  Result Value Ref Range   WBC 12.6 (H) 4.0 - 10.5 K/uL   RBC 4.55 3.87 - 5.11 MIL/uL   Hemoglobin 13.0 12.0 - 15.0 g/dL   HCT 83.3 82.5 - 05.3 %   MCV 90.5 80.0 - 100.0 fL   MCH 28.6 26.0 - 34.0 pg   MCHC 31.6 30.0 - 36.0 g/dL   RDW 97.6 73.4 - 19.3 %   Platelets 417 (H) 150 - 400 K/uL   nRBC 0.0 0.0 - 0.2 %   Neutrophils Relative % 75 %   Neutro Abs 9.5 (H) 1.7 - 7.7 K/uL   Lymphocytes Relative 18 %   Lymphs Abs 2.3 0.7 - 4.0 K/uL   Monocytes Relative 5 %   Monocytes Absolute 0.7 0.1 - 1.0 K/uL   Eosinophils Relative 1 %   Eosinophils Absolute 0.1 0.0 - 0.5 K/uL   Basophils Relative 0 %   Basophils Absolute 0.0 0.0 - 0.1 K/uL   Immature Granulocytes 1 %   Abs Immature Granulocytes 0.06 0.00 - 0.07 K/uL    Comment: Performed at [facility], [address], [place], [place] [address]  Respiratory Panel by RT PCR (Flu A&B, Covid) - Nasopharyngeal Swab     Status: None   Collection Time: 11/18/19  1:20 AM   Specimen: Nasopharyngeal Swab  Result Value Ref Range   SARS Coronavirus 2 by RT PCR NEGATIVE NEGATIVE    Comment: (NOTE) SARS-CoV-2 target nucleic acids are NOT DETECTED. The SARS-CoV-2 RNA is generally detectable in upper respiratoy specimens during the acute phase of infection. The lowest concentration of SARS-CoV-2 viral copies this assay can detect is 131 copies/mL. A negative result does not preclude SARS-Cov-2 infection and should not be used as the sole basis for treatment or other patient management decisions. A negative result may occur with  improper specimen collection/handling, submission of specimen other than  nasopharyngeal swab, presence of viral mutation(s) within the areas targeted by this assay, and inadequate number of viral copies (<131 copies/mL). A negative result must be combined with clinical observations, patient history, and epidemiological information. The expected result is Negative. Fact Sheet for Patients:  https://www.moore.com/ Fact Sheet for Healthcare Providers:  https://www.young.biz/ This test is not yet ap proved or cleared by the [place] FDA and  has been authorized for detection and/or diagnosis of SARS-CoV-2 by FDA under an Emergency Use Authorization (EUA). This EUA will remain  in effect (meaning this test can be used) for the duration of the COVID-19 declaration under Section 564(b)(1) of the Act, 21 U.S.C. section 360bbb-3(b)(1), unless the authorization is terminated or revoked sooner.    Influenza A by PCR NEGATIVE NEGATIVE   Influenza B by PCR NEGATIVE NEGATIVE    Comment: (NOTE) The Xpert Xpress SARS-CoV-2/FLU/RSV assay is intended as an aid in  the diagnosis of influenza from Nasopharyngeal swab specimens and  should not be used as a sole basis for treatment. Nasal washings and  aspirates are unacceptable for Xpert Xpress SARS-CoV-2/FLU/RSV  testing. Fact Sheet for Patients: https://www.moore.com/ Fact Sheet for Healthcare Providers: https://www.young.biz/ This test is not yet approved or cleared by the [place] FDA and  has been authorized for detection and/or diagnosis of SARS-CoV-2 by  FDA under an Emergency Use Authorization (EUA). This EUA will remain  in effect (meaning this test can be used) for the duration of  the  Covid-19 declaration under Section 564(b)(1) of the Act, 21  U.S.C. section 360bbb-3(b)(1), unless the authorization is  terminated or revoked. Performed at [facility], [address], [place], [place] [address]   CBC with Differential/Platelet      Status: None   Collection Time: 11/18/19  6:04 AM  Result Value Ref Range   WBC 10.2 4.0 - 10.5 K/uL   RBC 4.23 3.87 - 5.11 MIL/uL   Hemoglobin 12.1 12.0 - 15.0 g/dL   HCT 37.1 06.2 - 69.4 %   MCV 91.0 80.0 - 100.0 fL   MCH 28.6 26.0 - 34.0 pg   MCHC 31.4 30.0 - 36.0 g/dL   RDW 85.4 62.7 - 03.5 %   Platelets 395 150 - 400 K/uL   nRBC 0.0 0.0 - 0.2 %   Neutrophils Relative % 70 %   Neutro Abs 7.2 1.7 - 7.7 K/uL   Lymphocytes Relative 23 %   Lymphs Abs 2.3 0.7 - 4.0 K/uL   Monocytes Relative 6 %   Monocytes Absolute 0.6 0.1 - 1.0 K/uL   Eosinophils Relative 1 %   Eosinophils Absolute 0.1 0.0 - 0.5 K/uL   Basophils Relative 0 %   Basophils Absolute 0.0 0.0 - 0.1 K/uL   Immature Granulocytes 0 %   Abs Immature Granulocytes 0.04 0.00 - 0.07 K/uL    Comment: Performed at [facility], [address] [place], [place] [address]    US Abdomen Limited  Result Date: 11/17/2019 CLINICAL DATA:  Right upper quadrant and epigastric pain EXAM: ULTRASOUND ABDOMEN LIMITED RIGHT UPPER QUADRANT COMPARISON:  Abdominal ultrasound 07/31/2019 FINDINGS: Gallbladder: There are multiple shadowing gallstones measuring up to 1.4 cm. No wall thickening visualized. No sonographic Murphy sign noted by sonographer. Common bile duct: Diameter: 0.4 cm, within normal limits Liver: Assessment somewhat limited by shadowing bowel gas. No focal lesion identified. Within normal limits in parenchymal echogenicity. Portal vein is patent on color Doppler imaging with normal direction of blood flow towards the liver. Other: None. IMPRESSION: Cholelithiasis without evidence of cholecystitis. Electronically Signed   By: Emmaline Kluver M.D.   On: 11/17/2019 12:04     Assessment & Plan:  HAJA CREGO is a 20 y.o. female with symptomatic cholelithiasis and possibly early cholecystitis. Ruth King continues to have pain and nausea/vomiting. Ruth King came to the ED twice in one day. Given the pain and the continuation we discussed  removing her gallbladder.   -PLAN: I counseled the patient about the indication, risks and benefits of laparoscopic cholecystectomy.  Ruth King understands there is a very small chance for bleeding, infection, injury to normal structures (including common bile duct), conversion to open surgery, persistent symptoms, evolution of postcholecystectomy diarrhea, need for secondary interventions, anesthesia reaction, cardiopulmonary issues and other risks not specifically detailed here. I described the expected recovery, the plan for follow-up and the restrictions during the recovery phase.  All questions were answered.  COVID negative. I called her mom Ruth King and Ruth King had no questions.  All questions were answered to the satisfaction of the patient and family.    Lucretia Roers 11/18/2019, 7:35 AM

## 2019-11-18 NOTE — Anesthesia Procedure Notes (Signed)
Procedure Name: Intubation Date/Time: 11/18/2019 1:44 AM Performed by: Jonna Munro, CRNA Pre-anesthesia Checklist: Patient identified, Emergency Drugs available, Suction available, Patient being monitored and Timeout performed Patient Re-evaluated:Patient Re-evaluated prior to induction Oxygen Delivery Method: Circle system utilized Preoxygenation: Pre-oxygenation with 100% oxygen Induction Type: IV induction and Rapid sequence Laryngoscope Size: Mac and 3 Grade View: Grade I Tube type: Oral Tube size: 7.0 mm Number of attempts: 1 Airway Equipment and Method: Stylet Placement Confirmation: ETT inserted through vocal cords under direct vision,  positive ETCO2 and breath sounds checked- equal and bilateral Secured at: 22 cm Tube secured with: Tape Dental Injury: Teeth and Oropharynx as per pre-operative assessment

## 2019-11-18 NOTE — Discharge Instructions (Addendum)
Discharge Laparoscopic Surgery Instructions:  Common Complaints: Right shoulder pain is common after laparoscopic surgery. This is secondary to the gas used in the surgery being trapped under the diaphragm.  Walk to help your body absorb the gas. This will improve in a few days. Pain at the port sites are common, especially the larger port sites. This will improve with time.  Some nausea is common and poor appetite. The main goal is to stay hydrated the first few days after surgery.   Diet/ Activity: Diet as tolerated. You may not have an appetite, but it is important to stay hydrated. Drink 64 ounces of water a day. Your appetite will return with time.  Shower per your regular routine daily.  Do not take hot showers. Take warm showers that are less than 10 minutes. Rest and listen to your body, but do not remain in bed all day.  Walk everyday for at least 15-20 minutes. Deep cough and move around every 1-2 hours in the first few days after surgery.  Do not lift > 10 lbs, perform excessive bending, pushing, pulling, squatting for 1-2 weeks after surgery.  Do not pick at the dermabond glue on your incision sites.  This glue film will remain in place for 1-2 weeks and will start to peel off.  Do not place lotions or balms on your incision unless instructed to specifically by Dr. Ladell Lea.   Pain Expectations and Narcotics: -After surgery you will have pain associated with your incisions and this is normal. The pain is muscular and nerve pain, and will get better with time. -You are encouraged and expected to take non narcotic medications like tylenol and ibuprofen (when able) to treat pain as multiple modalities can aid with pain treatment. -Narcotics are only used when pain is severe or there is breakthrough pain. -You are not expected to have a pain score of 0 after surgery, as we cannot prevent pain. A pain score of 3-4 that allows you to be functional, move, walk, and tolerate some activity is  the goal. The pain will continue to improve over the days after surgery and is dependent on your surgery. -Due to [place] law, we are only able to give a certain amount of pain medication to treat post operative pain, and we only give additional narcotics on a patient by patient basis.  -For most laparoscopic surgery, studies have shown that the majority of patients only need 10-15 narcotic pills, and for open surgeries most patients only need 15-20.   -Having appropriate expectations of pain and knowledge of pain management with non narcotics is important as we do not want anyone to become addicted to narcotic pain medication.  -Using ice packs in the first 48 hours and heating pads after 48 hours, wearing an abdominal binder (when recommended), and using over the counter medications are all ways to help with pain management.   -Simple acts like meditation and mindfulness practices after surgery can also help with pain control and research has proven the benefit of these practices.  Medication: Take tylenol and ibuprofen as needed for pain control, alternating every 4-6 hours.  Example:  Tylenol 1000mg @ 6am, 12noon, 6pm, 12midnight (Do not exceed 4000mg of tylenol a day). Ibuprofen 800mg @ 9am, 3pm, 9pm, 3am (Do not exceed 3600mg of ibuprofen a day).  Take Roxicodone for breakthrough pain every 4 hours.  Take Colace for constipation related to narcotic pain medication. If you do not have a bowel movement in 2 days, take Miralax   over the counter.  Drink plenty of water to also prevent constipation.   Contact Information: If you have questions or concerns, please call our office, [phone], Monday- Thursday 8AM-5PM and Friday 8AM-12Noon.  If it is after hours or on the weekend, please call [facility] Main [facility], [phone], and ask to speak to the surgeon on call for Dr. Constance Haw at [facility].    Laparoscopic Cholecystectomy, Care After This sheet gives you information about how to care for  yourself after your procedure. Your doctor may also give you more specific instructions. If you have problems or questions, contact your doctor. Follow these instructions at home: Care for cuts from surgery (incisions)   Follow instructions from your doctor about how to take care of your cuts from surgery. Make sure you: ? Wash your hands with soap and water before you change your bandage (dressing). If you cannot use soap and water, use hand sanitizer. ? Change your bandage as told by your doctor. ? Leave stitches (sutures), skin glue, or skin tape (adhesive) strips in place. They may need to stay in place for 2 weeks or longer. If tape strips get loose and curl up, you may trim the loose edges. Do not remove tape strips completely unless your doctor says it is okay.  Do not take baths, swim, or use a hot tub until your doctor says it is okay.   You may shower.  Check your surgical cut area every day for signs of infection. Check for: ? More redness, swelling, or pain. ? More fluid or blood. ? Warmth. ? Pus or a bad smell. Activity  Do not drive or use heavy machinery while taking prescription pain medicine.  Do not lift anything that is heavier than 10 lb (4.5 kg) until your doctor says it is okay.  Do not play contact sports until your doctor says it is okay.  Do not drive for 24 hours if you were given a medicine to help you relax (sedative).  Rest as needed. Do not return to work or school until your doctor says it is okay. General instructions  Take over-the-counter and prescription medicines only as told by your doctor.  To prevent or treat constipation while you are taking prescription pain medicine, your doctor may recommend that you: ? Drink enough fluid to keep your pee (urine) clear or pale yellow. ? Take over-the-counter or prescription medicines. ? Eat foods that are high in fiber, such as fresh fruits and vegetables, whole grains, and beans. ? Limit foods that are  high in fat and processed sugars, such as fried and sweet foods. Contact a doctor if:  You develop a rash.  You have more redness, swelling, or pain around your surgical cuts.  You have more fluid or blood coming from your surgical cuts.  Your surgical cuts feel warm to the touch.  You have pus or a bad smell coming from your surgical cuts.  You have a fever.  One or more of your surgical cuts breaks open. Get help right away if:  You have trouble breathing.  You have chest pain.  You have pain that is getting worse in your shoulders.  You faint or feel dizzy when you stand.  You have very bad pain in your belly (abdomen).  You are sick to your stomach (nauseous) for more than one day.  You have throwing up (vomiting) that lasts for more than one day.  You have leg pain. This information is not intended to replace  advice given to you by your health care provider. Make sure you discuss any questions you have with your health care provider. Document Revised: 09/01/2017 Document Reviewed: 03/07/2016 Elsevier Patient Education  2020 [employer].

## 2019-11-19 LAB — SURGICAL PATHOLOGY

## 2019-11-19 MED ORDER — DOCUSATE SODIUM 100 MG PO CAPS: 100.0000 mg | ORAL_CAPSULE | Freq: Two times a day (BID) | ORAL | 2 refills | Status: AC

## 2019-11-19 MED ORDER — ONDANSETRON 4 MG PO TBDP: 4.0000 mg | ORAL_TABLET | Freq: Four times a day (QID) | ORAL | 0 refills | Status: DC | PRN

## 2019-11-19 MED ORDER — OXYCODONE HCL 5 MG PO TABS: 5.0000 mg | ORAL_TABLET | ORAL | 0 refills | Status: DC | PRN

## 2019-11-19 NOTE — Progress Notes (Signed)
Discharge instructions reviewed with patient. Mother at bedside. Given copy of AVS, verbalized understanding of instructions, post-op care, follow-up with Dr. Henreitta Leber, and to pick up prescriptions from her pharmacy. Requested work note from MD. Notified Dr. Henreitta Leber. Note in [facility] from MD, provided for patient. Patient in stable condition awaiting w/c and nursing staff assist off unit for discharge home.

## 2019-11-19 NOTE — Discharge Summary (Signed)
Physician Discharge Summary  Patient ID: Ruth King MRN: [id number] DOB/AGE: 10-17-2000 19 y.o.  Admit date: 11/17/2019 Discharge date: 11/19/2019  Admission Diagnoses: Cholelithiasis   Discharge Diagnoses:  Active Problems:   Cholelithiasis   Discharged Condition: good  Hospital Course: Ruth King is a 19 yo with symptomatic cholelithiasis that came to the ED 2 times in 24 hrs for pain and nausea/vomiting. She was admitted and her gallbladder was removed laparoscopically. Post operatively she had some pain control issues and remained overnight to get her pain under control. She was tolerating a diet, had pain control and was ambulating prior to discharge home.   Consults: None  Significant Diagnostic Studies: [place]- gallstones, no definitive cholecystitis on imaging   Treatments: 11/18/2019- laparoscopic cholecystectomy   Discharge Exam: Blood pressure 121/69, pulse 93, temperature 98.7 F (37.1 C), temperature source Oral, resp. rate 18, last menstrual period 11/11/2019, SpO2 100 %. General appearance: alert, cooperative and no distress Resp: normal work of breathing GI: soft, nondistended, appropriately tender, port sites c/d/i with dermabond  Disposition: Discharge disposition: 01-Home or Self Care       Discharge Instructions    Call MD for:  difficulty breathing, headache or visual disturbances   Complete by: As directed    Call MD for:  persistant dizziness or light-headedness   Complete by: As directed    Call MD for:  persistant nausea and vomiting   Complete by: As directed    Call MD for:  redness, tenderness, or signs of infection (pain, swelling, redness, odor or green/yellow discharge around incision site)   Complete by: As directed    Call MD for:  severe uncontrolled pain   Complete by: As directed    Call MD for:  temperature >100.4   Complete by: As directed    Increase activity slowly   Complete by: As directed      Allergies as of 11/19/2019       Reactions   Bee Venom Shortness Of Breath   Azithromycin Other (See Comments)   Not effective   Cephalosporins Hives      Medication List    STOP taking these medications   ondansetron 4 MG tablet Commonly known as: ZOFRAN     TAKE these medications   albuterol 108 (90 Base) MCG/ACT inhaler Commonly known as: VENTOLIN HFA Inhale 2 puffs into the lungs every 6 (six) hours as needed for wheezing or shortness of breath.   Butalbital-APAP-Caffeine 50-300-40 MG Caps Commonly known as: Fioricet Take 1 capsule by mouth every 4 (four) hours as needed (headache).   cetirizine 10 MG tablet Commonly known as: ZYRTEC Take 1 tablet (10 mg total) by mouth at bedtime.   docusate sodium 100 MG capsule Commonly known as: Colace Take 1 capsule (100 mg total) by mouth 2 (two) times daily.   fluticasone 50 MCG/ACT nasal spray Commonly known as: FLONASE Place 2 sprays into both nostrils daily. What changed:   when to take this  reasons to take this   metFORMIN 500 MG tablet Commonly known as: GLUCOPHAGE Take 1 tablet (500 mg total) by mouth daily with breakfast.   montelukast 10 MG tablet Commonly known as: SINGULAIR Take 1 tablet (10 mg total) by mouth at bedtime.   norethindrone-ethinyl estradiol-iron 1.5-30 MG-MCG tablet Commonly known as: Larin Fe 1.5/30 Take 1 tablet by mouth daily. Skip inactive pills x 2 mo, starting new pack immediately, then take for third month   omeprazole 20 MG capsule Commonly known as: PRILOSEC Take  1 capsule (20 mg total) by mouth daily.   ondansetron 4 MG disintegrating tablet Commonly known as: ZOFRAN-ODT Take 1 tablet (4 mg total) by mouth every 6 (six) hours as needed for nausea.   oxyCODONE 5 MG immediate release tablet Commonly known as: Oxy IR/ROXICODONE Take 1 tablet (5 mg total) by mouth every 4 (four) hours as needed for severe pain or breakthrough pain.   rizatriptan 10 MG tablet Commonly known as: MAXALT TAKE 1 TABLET BY  MOUTH AS NEEDED. MAY REPEAT IN TWO HOURS IF NEEDED  Needs to be seen for further refills. What changed: additional instructions   topiramate 50 MG tablet Commonly known as: TOPAMAX Take 1 tablet (50 mg total) by mouth 2 (two) times daily. What changed:   when to take this  reasons to take this   Ubrelvy 50 MG Tabs Generic drug: Ubrogepant Take 50 mg by mouth as directed. Take at onset of headache, may repeat after 2 hours if headache still present. Max 2 tabs per day. What changed:   when to take this  reasons to take this      Follow-up Information    Lucretia Roers, MD On 12/03/2019.   Specialty: General Surgery Why: post operative phone call, if you need to be seen in person, call the office  Contact information: [address] [place] [facility] [address] [phone]           Signed: Lucretia Roers 11/19/2019, 5:02 PM

## 2019-11-19 NOTE — Progress Notes (Signed)
[facility]  To whom it may concern,  Ms. Ruth King was in the hospital 11/17/2019- 11/19/2019. She underwent surgery. Due to the lifting at her job, she will remain out of work until 12/02/2019.  If additional information is needed or paperwork is needed, the patient will notify my office.   Algis Greenhouse, MD [facility] [address] [place], [place] [address] [phone] (office)

## 2019-12-03 ENCOUNTER — Other Ambulatory Visit: Payer: Self-pay

## 2019-12-03 ENCOUNTER — Telehealth (INDEPENDENT_AMBULATORY_CARE_PROVIDER_SITE_OTHER): Payer: Self-pay | Admitting: General Surgery

## 2019-12-03 DIAGNOSIS — K8010 Calculus of gallbladder with chronic cholecystitis without obstruction: Secondary | ICD-10-CM

## 2019-12-03 NOTE — Progress Notes (Signed)
[facility]  I am calling the patient for post operative evaluation due to the current COVID 19 pandemic.  The patient had a laparoscopic cholecystectomy on 11/18/2019. The patient reports that they are doing good. The are tolerating a diet, having good pain control, and having regular Bms.  The patient has no concerns.  Incisions healing well.  Pathology: FINAL MICROSCOPIC DIAGNOSIS:   A. 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  Will see the patient PRN.   Activity and diet as tolerated. Ok to return to work. Will write a work note and patient will come pick up or ask [place] to fax if she finds a number.  Algis Greenhouse, MD [facility] [address] [place] [employer], [place] [address] [phone] (office)

## 2020-01-16 ENCOUNTER — Other Ambulatory Visit: Payer: Self-pay | Admitting: Family Medicine

## 2020-01-16 DIAGNOSIS — G43809 Other migraine, not intractable, without status migrainosus: Secondary | ICD-10-CM

## 2020-01-16 DIAGNOSIS — G43009 Migraine without aura, not intractable, without status migrainosus: Secondary | ICD-10-CM

## 2020-01-16 NOTE — Telephone Encounter (Signed)
Dettinger NTBS 30 days given 12/26/19

## 2020-02-20 IMAGING — US US ABDOMEN LIMITED
1 series · 14 of 25 positions shown · non-contrast
Comparison: None.

CLINICAL DATA: Right upper quadrant pain

EXAM:
ULTRASOUND ABDOMEN LIMITED RIGHT UPPER QUADRANT

[Series 1: us abdomen limited · 0.19mm/px · 14 of 87 slices shown]
[im 1/87]
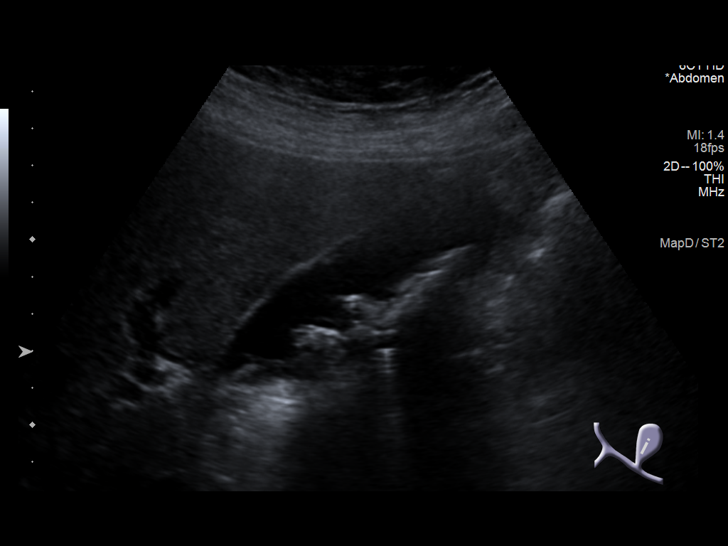
[im 8/87]
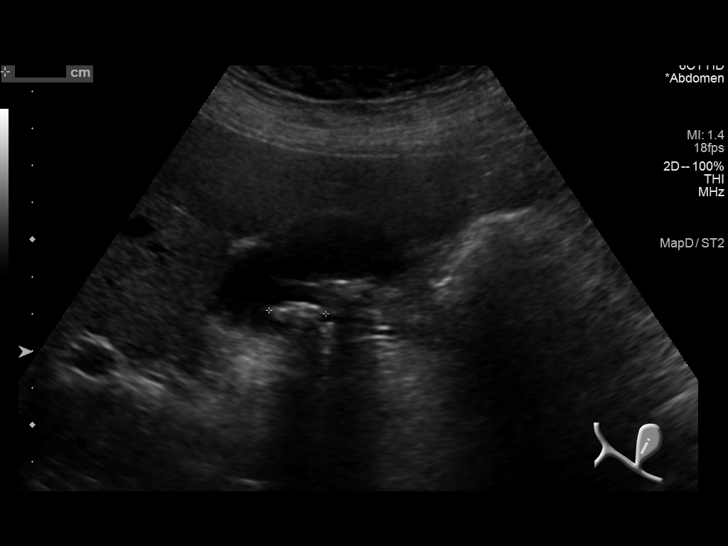
[im 15/87]
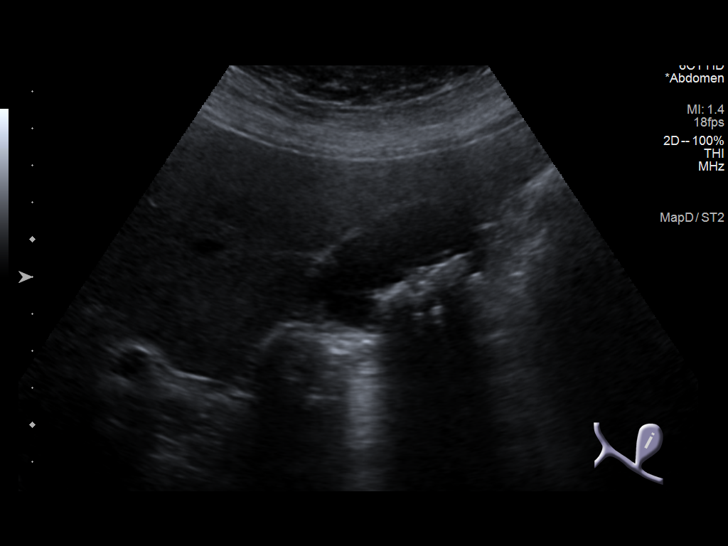
[im 22/87]
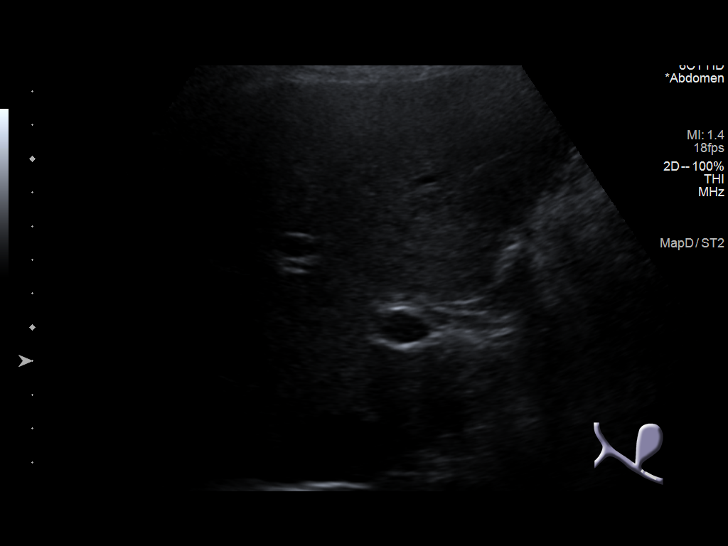
[im 29/87]
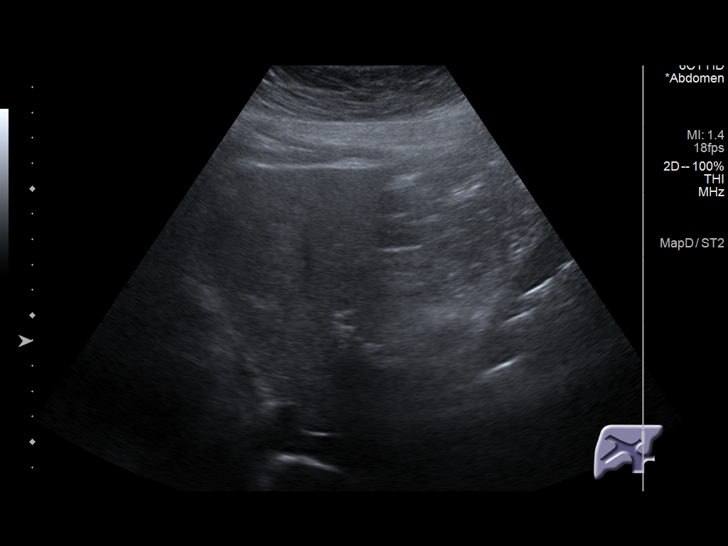
[im 33/87]
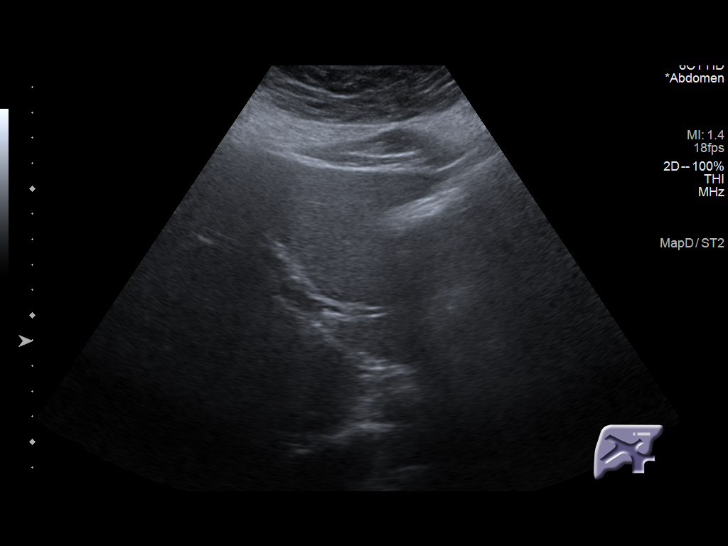
[im 40/87]
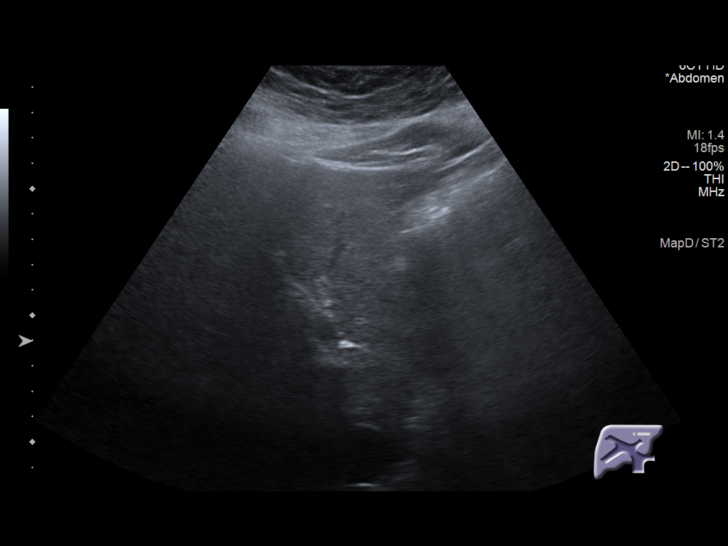
[im 47/87]
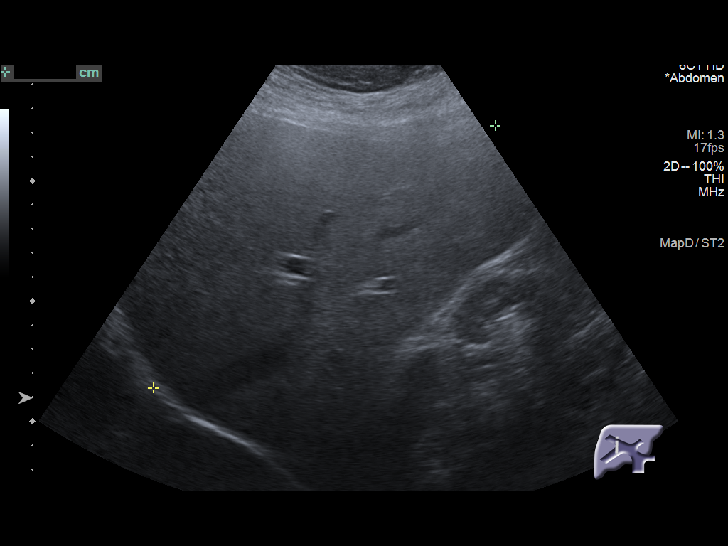
[im 54/87]
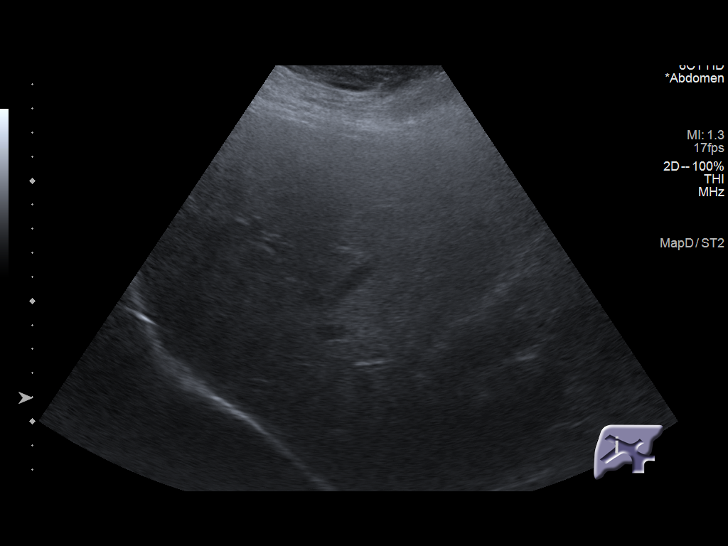
[im 58/87]
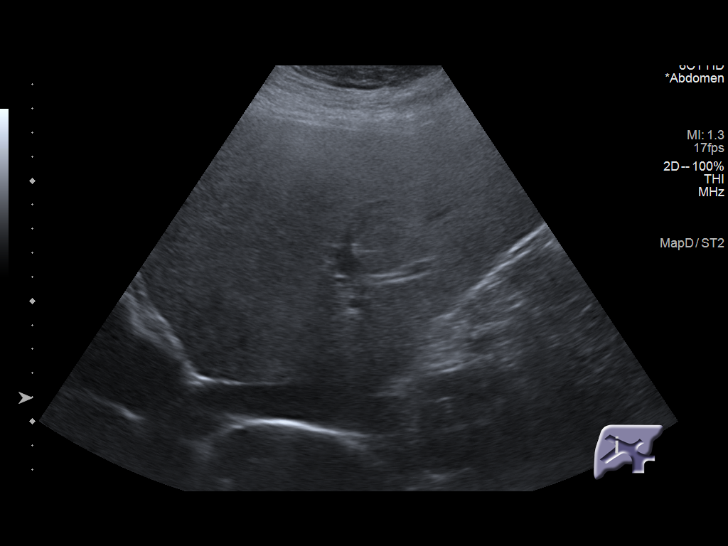
[im 65/87]
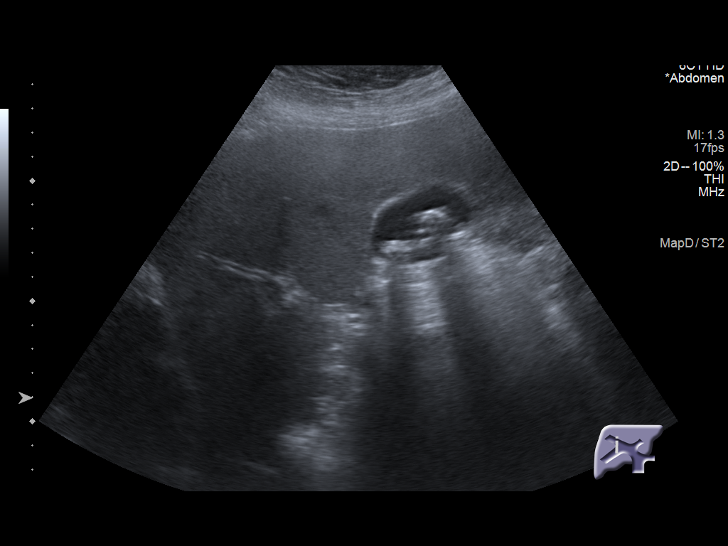
[im 72/87]
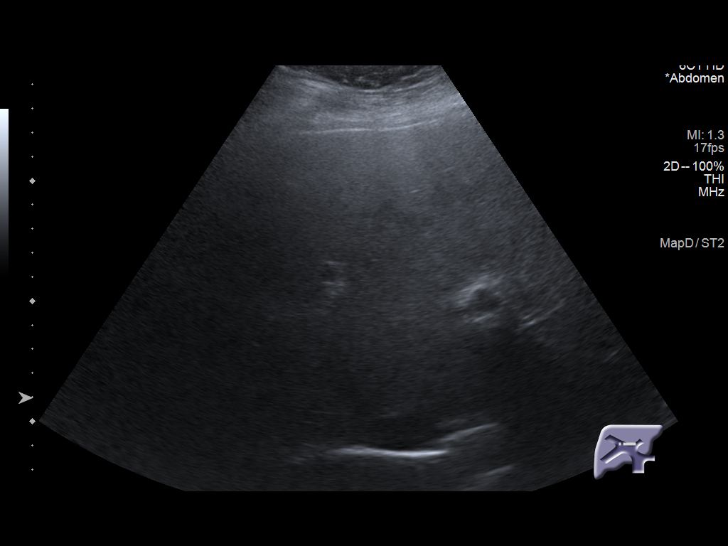
[im 79/87]
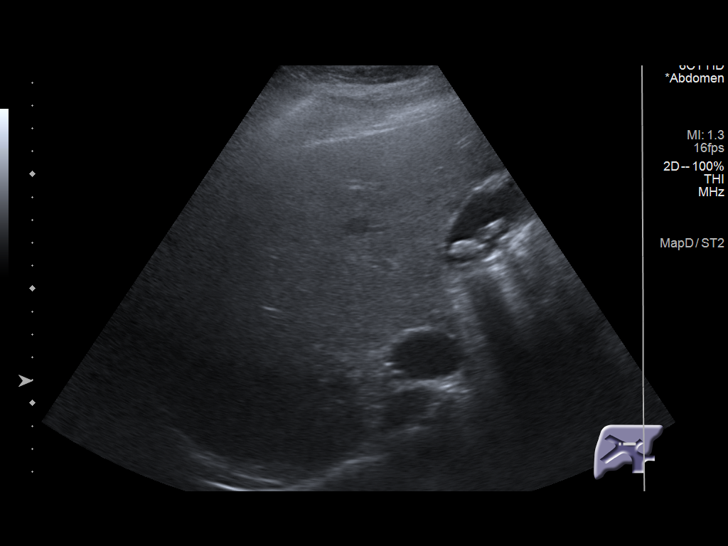
[im 87/87]
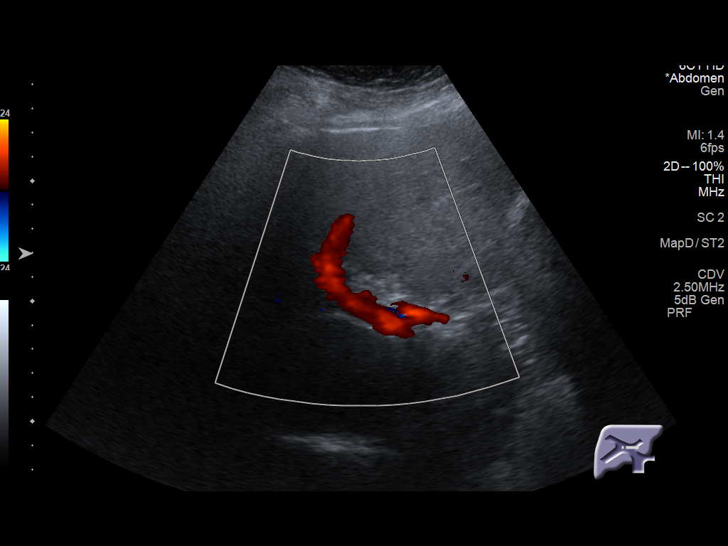

[14 of 25 positions shown; findings below may reference images not displayed]

FINDINGS: Gallbladder:

Multiple shadowing stones measuring up to 1.5 cm. Normal wall
thickness. Negative sonographic Murphy.

Common bile duct:

Diameter: 3.2 mm

Liver:

Borderline to slightly enlarged. Increased hepatic echogenicity
without focal hepatic abnormality. Portal vein is patent on color
Doppler imaging with normal direction of blood flow towards the
liver.

Other: None.
IMPRESSION: 1. Cholelithiasis without sonographic evidence for acute
cholecystitis or biliary dilatation
2. Echogenic liver as may be seen with steatosis

## 2020-06-08 IMAGING — US US ABDOMEN LIMITED
1 series · 14 of 25 positions shown · non-contrast
Comparison: Abdominal ultrasound 07/31/2019

CLINICAL DATA: Right upper quadrant and epigastric pain

EXAM:
ULTRASOUND ABDOMEN LIMITED RIGHT UPPER QUADRANT

[Series 1: us abdomen limited · 0.22mm/px · 14 of 100 slices shown]
[im 1/100]
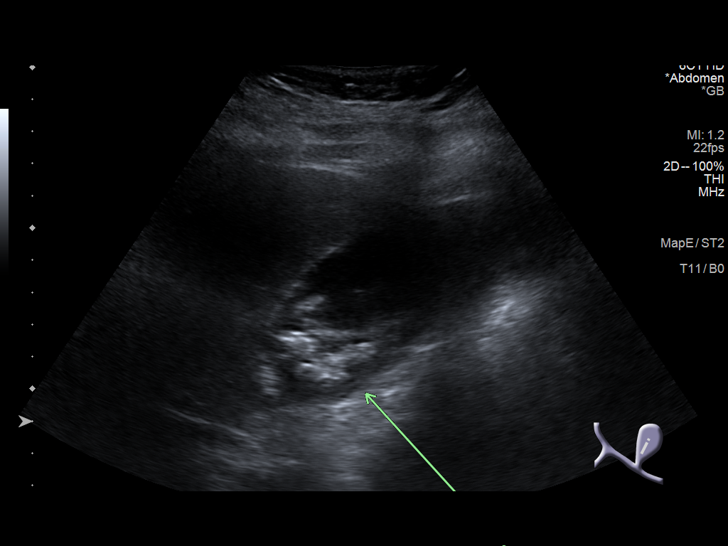
[im 9/100]
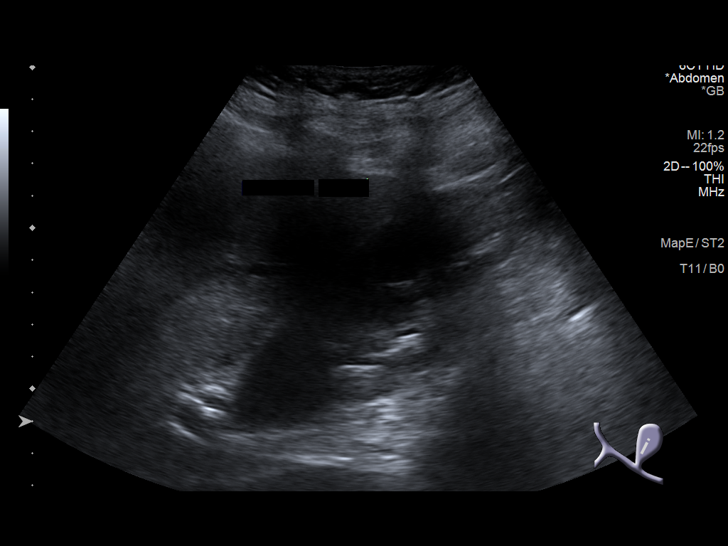
[im 17/100]
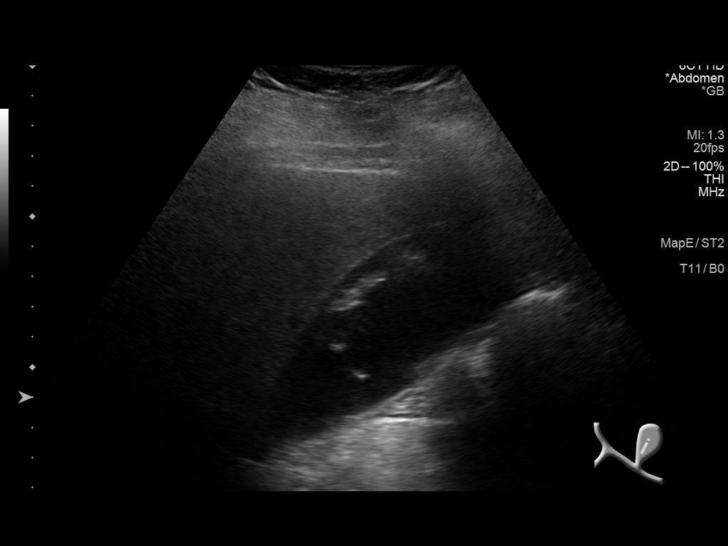
[im 25/100]
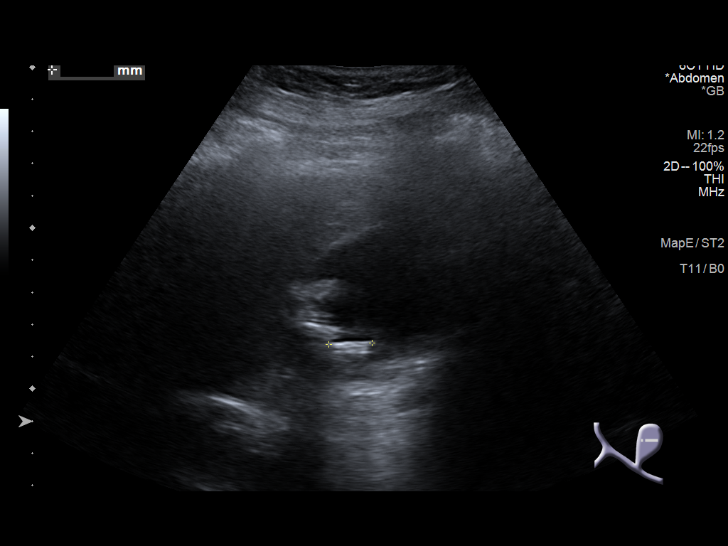
[im 34/100]
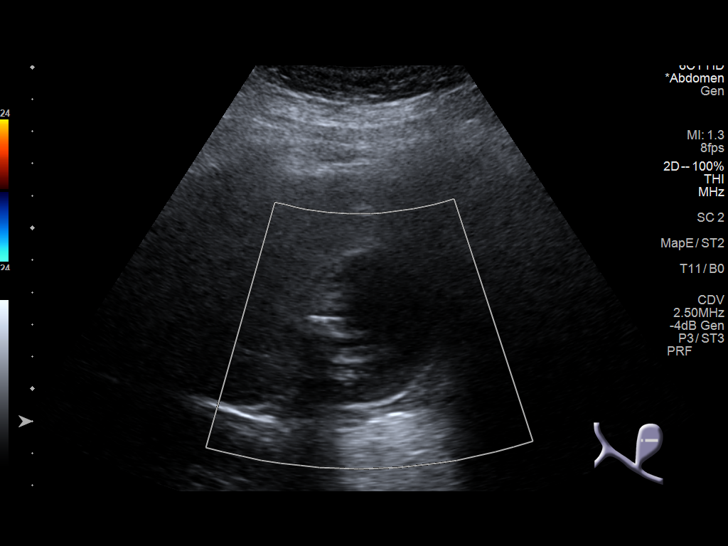
[im 38/100]
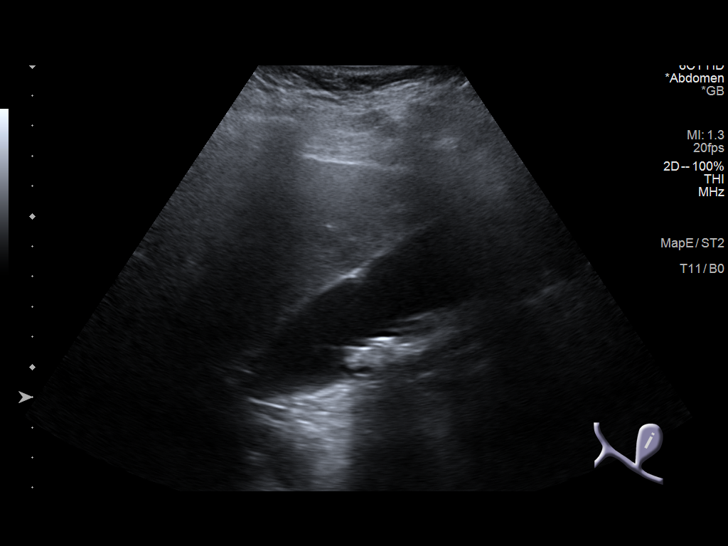
[im 46/100]
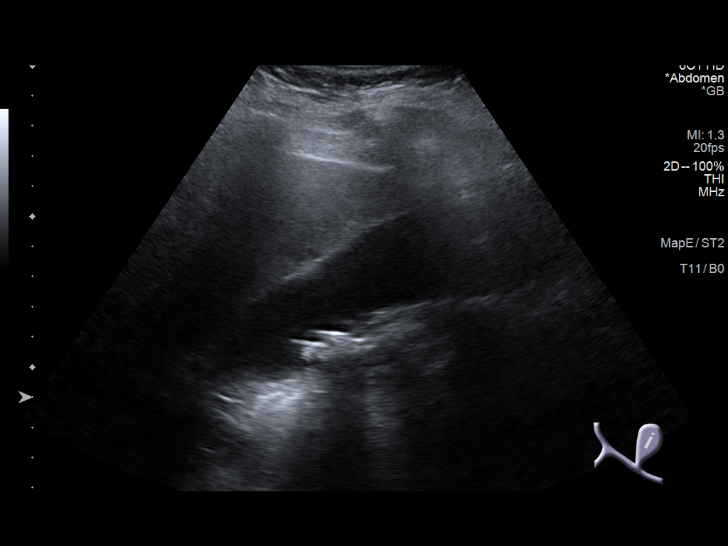
[im 54/100]
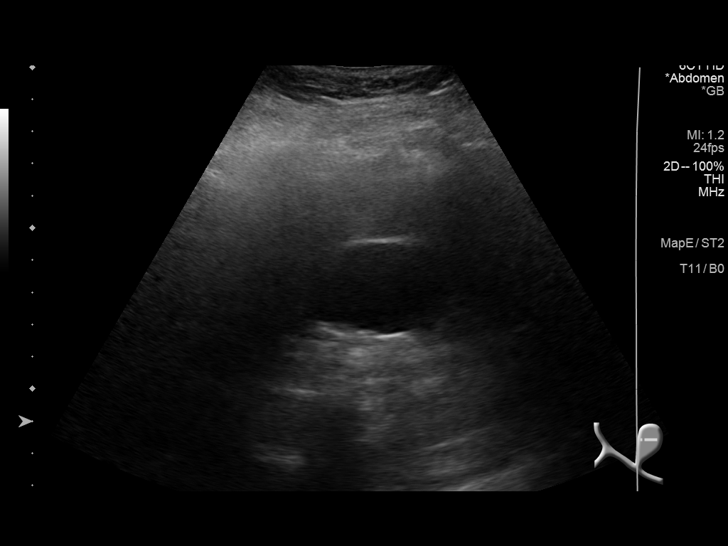
[im 62/100]
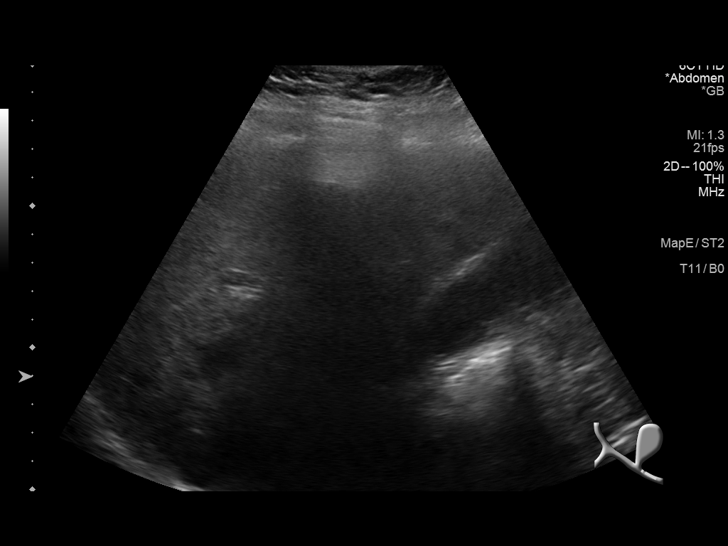
[im 67/100]
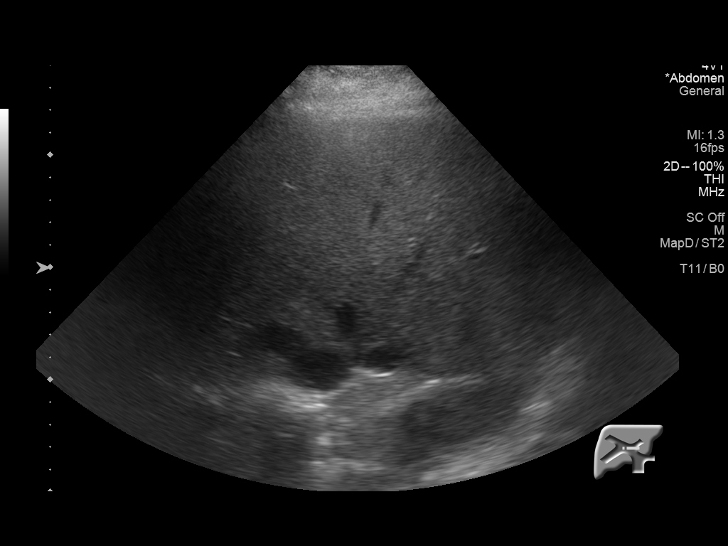
[im 75/100]
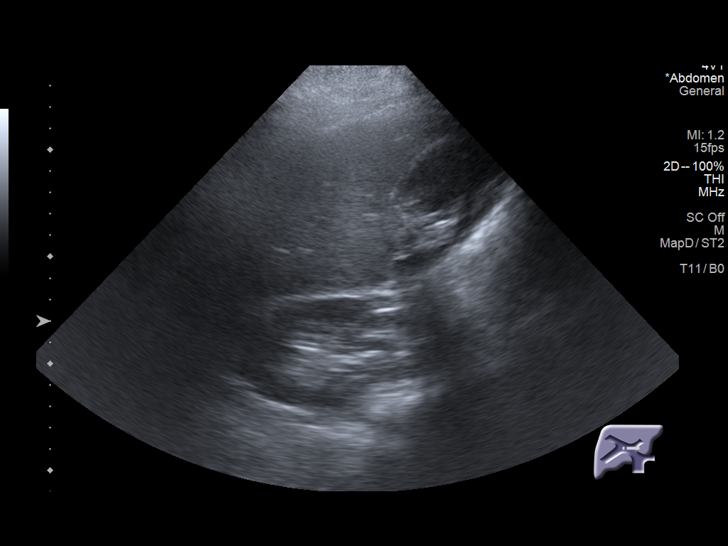
[im 83/100]
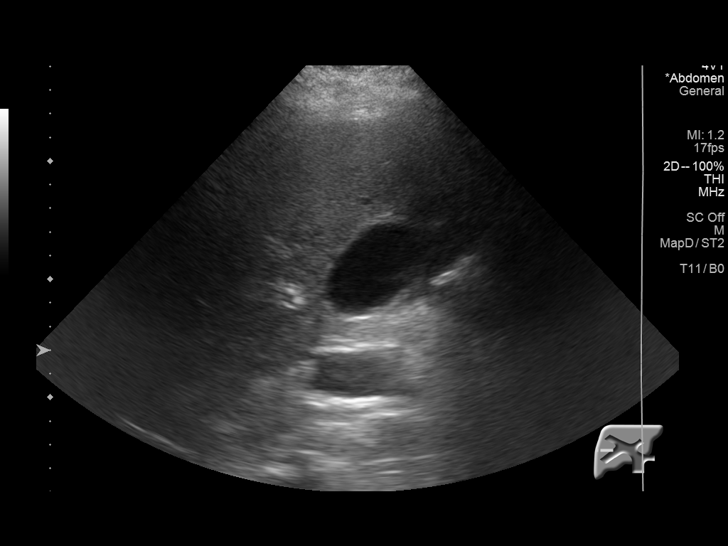
[im 91/100]
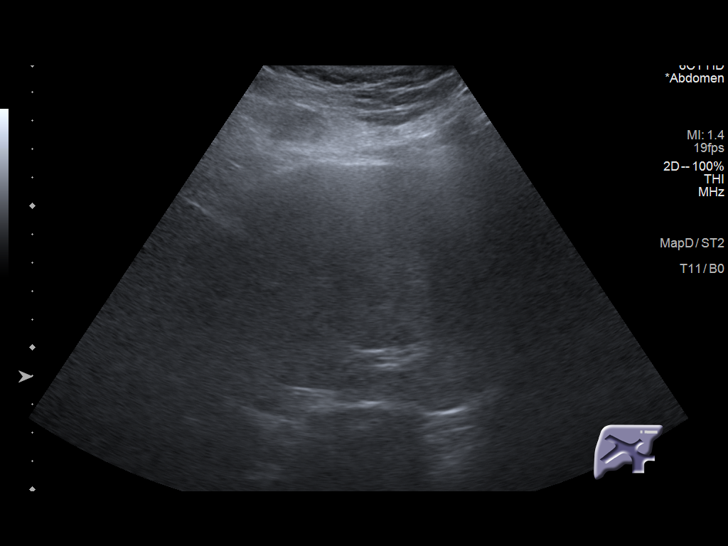
[im 100/100]
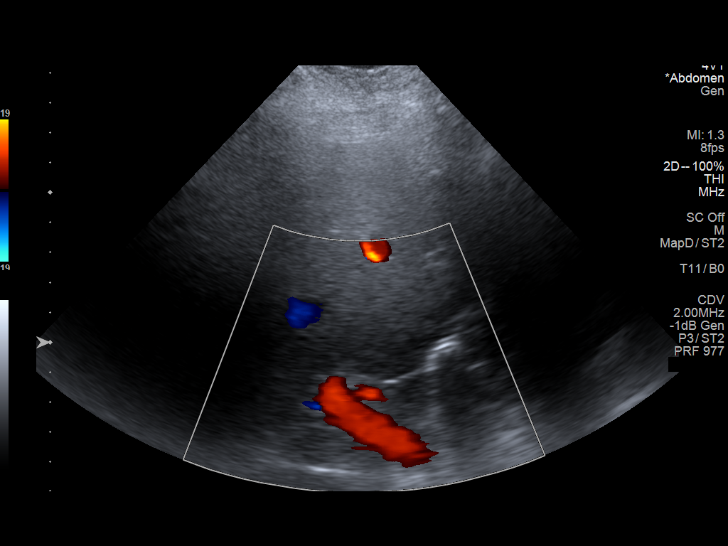

[14 of 25 positions shown; findings below may reference images not displayed]

FINDINGS: Gallbladder:

There are multiple shadowing gallstones measuring up to 1.4 cm. No
wall thickening visualized. No sonographic Murphy sign noted by
sonographer.

Common bile duct:

Diameter: 0.4 cm, within normal limits

Liver:

Assessment somewhat limited by shadowing bowel gas. No focal lesion
identified. Within normal limits in parenchymal echogenicity. Portal
vein is patent on color Doppler imaging with normal direction of
blood flow towards the liver.

Other: None.
IMPRESSION: Cholelithiasis without evidence of cholecystitis.

## 2020-06-12 ENCOUNTER — Ambulatory Visit: Payer: PRIVATE HEALTH INSURANCE | Admitting: Nurse Practitioner

## 2020-06-15 ENCOUNTER — Encounter: Payer: Self-pay | Admitting: Family Medicine

## 2020-06-22 ENCOUNTER — Emergency Department (HOSPITAL_COMMUNITY)
Admission: EM | Admit: 2020-06-22 | Discharge: 2020-06-22 | Disposition: A | Discharge status: Home / Self Care | Payer: No Typology Code available for payment source | Attending: Emergency Medicine | Admitting: Emergency Medicine

## 2020-06-22 ENCOUNTER — Encounter (HOSPITAL_COMMUNITY): Payer: Self-pay | Admitting: Emergency Medicine

## 2020-06-22 ENCOUNTER — Other Ambulatory Visit: Payer: Self-pay

## 2020-06-22 ENCOUNTER — Emergency Department (HOSPITAL_COMMUNITY): Payer: No Typology Code available for payment source

## 2020-06-22 DIAGNOSIS — J45909 Unspecified asthma, uncomplicated: Secondary | ICD-10-CM | POA: Insufficient documentation

## 2020-06-22 DIAGNOSIS — S99911A Unspecified injury of right ankle, initial encounter: Secondary | ICD-10-CM | POA: Diagnosis present

## 2020-06-22 DIAGNOSIS — S93401A Sprain of unspecified ligament of right ankle, initial encounter: Secondary | ICD-10-CM | POA: Diagnosis not present

## 2020-06-22 DIAGNOSIS — W541XXA Struck by dog, initial encounter: Secondary | ICD-10-CM | POA: Insufficient documentation

## 2020-06-22 DIAGNOSIS — Z7984 Long term (current) use of oral hypoglycemic drugs: Secondary | ICD-10-CM | POA: Diagnosis not present

## 2020-06-22 DIAGNOSIS — Z7951 Long term (current) use of inhaled steroids: Secondary | ICD-10-CM | POA: Insufficient documentation

## 2020-06-22 DIAGNOSIS — I10 Essential (primary) hypertension: Secondary | ICD-10-CM | POA: Insufficient documentation

## 2020-06-22 MED ORDER — ACETAMINOPHEN 325 MG PO TABS
650.0000 mg | ORAL_TABLET | Freq: Once | ORAL | Status: AC
  Administered 2020-06-22: 650 mg via ORAL
  Filled 2020-06-22: qty 2

## 2020-06-22 NOTE — ED Provider Notes (Signed)
[facility] EMERGENCY DEPARTMENT Provider Note   CSN: [id number] Arrival date & time: 06/22/20  1046     History No chief complaint on file.   LEATHIE WEICH is a 19 y.o. female.  HPI   Patient tripped over dog toy this morning.  She twisted her right ankle.  She is having pain and unable to bear weight.  She denies any other injury.  Pain is moderate.  Past Medical History:  Diagnosis Date  . Asthma   . Nausea & vomiting     Patient Active Problem List   Diagnosis Date Noted  . Cholelithiasis 11/18/2019  . Depression, recurrent (HCC) 03/11/2019  . Migraine headache 01/24/2019  . Diabetes mellitus without complication (HCC) 11/16/2018  . 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 Asthma, chronic 02/25/2016  . PCOS (polycystic ovarian syndrome) 09/23/2015  . Development delay 09/23/2015  . Learning disability 09/23/2015  . Nausea & vomiting     Past Surgical History:  Procedure Laterality Date  . ADENOIDECTOMY    . APPENDECTOMY    . CHOLECYSTECTOMY N/A 11/18/2019   Procedure: LAPAROSCOPIC CHOLECYSTECTOMY;  Surgeon: Lucretia Roers, MD;  Location: [facility];  Service: General;  Laterality: N/A;  . tubes in ears       OB History    Gravida      Para      Term      Preterm      AB      Living  0     SAB      TAB      Ectopic      Multiple      Live Births              Family History  Problem Relation Age of Onset  . Asthma Mother   . Gout Father   . Seizures Maternal Grandmother     Social History   Tobacco Use  . Smoking status: Never Smoker  . Smokeless tobacco: Never Used  Vaping Use  . Vaping Use: Never used  Substance Use Topics  . Alcohol use: No  . Drug use: No    Home Medications Prior to Admission medications   Medication Sig Start Date End Date Taking? Authorizing Provider  albuterol (PROVENTIL HFA;VENTOLIN HFA) 108 (90 Base) MCG/ACT inhaler Inhale 2 puffs into the lungs every 6 (six) hours as needed for  wheezing or shortness of breath. 10/08/18   Johna Sheriff, MD  Butalbital-APAP-Caffeine (FIORICET) 50-300-40 MG CAPS Take 1 capsule by mouth every 4 (four) hours as needed (headache). 06/18/19   Mechele Claude, MD  cetirizine (ZYRTEC) 10 MG tablet Take 1 tablet (10 mg total) by mouth at bedtime. 08/05/19   Dettinger, Elige Radon, MD  docusate sodium (COLACE) 100 MG capsule Take 1 capsule (100 mg total) by mouth 2 (two) times daily. 11/19/19 11/18/20  Lucretia Roers, MD  fluticasone (FLONASE) 50 MCG/ACT nasal spray Place 2 sprays into both nostrils daily. Patient taking differently: Place 2 sprays into both nostrils daily as needed for allergies or rhinitis.  05/26/16   Dettinger, Elige Radon, MD  metFORMIN (GLUCOPHAGE) 500 MG tablet Take 1 tablet (500 mg total) by mouth daily with breakfast. Patient not taking: Reported on 11/18/2019 08/05/19   Dettinger, Elige Radon, MD  montelukast (SINGULAIR) 10 MG tablet Take 1 tablet (10 mg total) by mouth at bedtime. 08/05/19   Dettinger, Elige Radon, MD  norethindrone-ethinyl estradiol-iron (LARIN FE 1.5/30) 1.5-30 MG-MCG tablet Take 1 tablet by mouth  daily. Skip inactive pills x 2 mo, starting new pack immediately, then take for third month 08/27/19   Dettinger, Elige Radon, MD  omeprazole (PRILOSEC) 20 MG capsule Take 1 capsule (20 mg total) by mouth daily. 08/05/19   Dettinger, Elige Radon, MD  ondansetron (ZOFRAN-ODT) 4 MG disintegrating tablet Take 1 tablet (4 mg total) by mouth every 6 (six) hours as needed for nausea. 11/19/19   Lucretia Roers, MD  oxyCODONE (OXY IR/ROXICODONE) 5 MG immediate release tablet Take 1 tablet (5 mg total) by mouth every 4 (four) hours as needed for severe pain or breakthrough pain. 11/19/19   Lucretia Roers, MD  rizatriptan (MAXALT) 10 MG tablet TAKE 1 TABLET BY MOUTH AS NEEDED. MAY REPEAT IN TWO HOURS IF NEEDED  Needs to be seen for further refills. 11/18/19   Dettinger, Elige Radon, MD  topiramate (TOPAMAX) 50 MG tablet Take 1 tablet (50 mg  total) by mouth 2 (two) times daily. Patient taking differently: Take 50 mg by mouth 2 (two) times daily as needed (migraine).  08/05/19   Dettinger, Elige Radon, MD  Ubrogepant (UBRELVY) 50 MG TABS Take 50 mg by mouth as directed. Take at onset of headache, may repeat after 2 hours if headache still present. Max 2 tabs per day. Patient taking differently: Take 50 mg by mouth daily as needed (migraine). Take at onset of headache, may repeat after 2 hours if headache still present. Max 2 tabs per day. 08/05/19   Dettinger, Elige Radon, MD    Allergies    Bee venom, Azithromycin, and Cephalosporins  Review of Systems   Review of Systems  All other systems reviewed and are negative.   Physical Exam Updated Vital Signs BP 119/79 (BP Location: Left Arm)   Pulse 85   Temp 98.4 F (36.9 C) (Oral)   Resp 20   Ht 1.829 m (6')   Wt 115.7 kg   LMP 06/15/2020   SpO2 100%   BMI 34.59 kg/m   Physical Exam Vitals and nursing note reviewed.  Constitutional:      General: She is not in acute distress.    Appearance: She is well-developed.  HENT:     Head: Normocephalic and atraumatic.     Right Ear: External ear normal.     Left Ear: External ear normal.     Nose: Nose normal.  Eyes:     Conjunctiva/sclera: Conjunctivae normal.     Pupils: Pupils are equal, round, and reactive to light.  Pulmonary:     Effort: Pulmonary effort is normal.  Musculoskeletal:        General: Normal range of motion.     Cervical back: Normal range of motion and neck supple.     Comments: Right ankle with lateral swelling Tenderness palpation lateral right ankle.  Dorsal pulses intact. Sensation intact  Skin:    General: Skin is warm and dry.  Neurological:     Mental Status: She is alert and oriented to person, place, and time.     Motor: No abnormal muscle tone.     Coordination: Coordination normal.  Psychiatric:        Behavior: Behavior normal.        Thought Content: Thought content normal.     ED  Results / Procedures / Treatments   Labs (all labs ordered are listed, but only abnormal results are displayed) Labs Reviewed - No data to display  EKG None  Radiology DG Ankle Complete Right  Result Date: 06/22/2020  CLINICAL DATA:  Pain following fall EXAM: RIGHT ANKLE - COMPLETE 3+ VIEW COMPARISON:  None. FINDINGS: Frontal, oblique, and lateral views were obtained. There is soft tissue swelling. No evident fracture or joint effusion. No joint space narrowing or erosion. Ankle mortise appears intact. There is a minimal posterior calcaneal spur. IMPRESSION: Soft tissue swelling. No evident fracture or appreciable arthropathy. Ankle mortise appears intact. There is a minimal posterior calcaneal spur. Electronically Signed   By: Bretta Bang III M.D.   On: 06/22/2020 12:32    Procedures Procedures (including critical care time)  Medications Ordered in ED Medications  acetaminophen (TYLENOL) tablet 650 mg (has no administration in time range)    ED Course  I have reviewed the triage vital signs and the nursing notes.  Pertinent labs & imaging results that were available during my care of the patient were reviewed by me and considered in my medical decision making (see chart for details).    MDM Rules/Calculators/A&P                          X-Elliyah Liszewski reviewed no evidence of acute fracture.  Plan Ace wrap, crutches, conservative therapy with cold and over-the-counter pain medications. Patient given note to be out of work until Tuesday and to use crutches until seen in follow-up.  Patient is followed by primary care doctor.  She is given referral to orthopedist for follow-up if she continues to have pain or problems with her ankle. Final Clinical Impression(s) / ED Diagnoses Final diagnoses:  Sprain of right ankle, unspecified ligament, initial encounter    Rx / DC Orders ED Discharge Orders    None       Margarita Grizzle, MD 06/22/20 1245

## 2020-06-22 NOTE — ED Triage Notes (Signed)
This morning patient was walking and tripped over toy, injuring right ankle.

## 2020-06-26 ENCOUNTER — Ambulatory Visit: Payer: No Typology Code available for payment source | Admitting: Family

## 2020-06-26 ENCOUNTER — Other Ambulatory Visit: Payer: Self-pay

## 2020-06-26 ENCOUNTER — Encounter: Payer: Self-pay | Admitting: Family

## 2020-06-26 VITALS — BP 125/74 | HR 97 | Temp 98.9°F | Ht 72.0 in | Wt 277.6 lb

## 2020-06-26 DIAGNOSIS — Z09 Encounter for follow-up examination after completed treatment for conditions other than malignant neoplasm: Secondary | ICD-10-CM | POA: Diagnosis not present

## 2020-06-26 DIAGNOSIS — S93401D Sprain of unspecified ligament of right ankle, subsequent encounter: Secondary | ICD-10-CM | POA: Diagnosis not present

## 2020-06-26 MED ORDER — DICLOFENAC SODIUM 75 MG PO TBEC: 75.0000 mg | DELAYED_RELEASE_TABLET | Freq: Two times a day (BID) | ORAL | 0 refills | Status: DC

## 2020-06-26 NOTE — Patient Instructions (Signed)
Ankle Sprain, Phase I Rehab An ankle sprain is an injury to the ligaments of your ankle. Ankle sprains cause stiffness, loss of motion, and loss of strength. Ask your health care provider which exercises are safe for you. Do exercises exactly as told by your health care provider and adjust them as directed. It is normal to feel mild stretching, pulling, tightness, or discomfort as you do these exercises. Stop right away if you feel sudden pain or your pain gets worse. Do not begin these exercises until told by your health care provider. Stretching and range-of-motion exercises These exercises warm up your muscles and joints and improve the movement and flexibility of your lower leg and ankle. These exercises also help to relieve pain and stiffness. Gastroc and soleus stretch This exercise is also called a calf stretch. It stretches the muscles in the back of the lower leg. These muscles are the gastrocnemius, or gastroc, and the soleus. 1. Sit on the floor with your left / right leg extended. 2. Loop a belt or towel around the ball of your left / right foot. The ball of your foot is on the walking surface, right under your toes. 3. Keep your left / right ankle and foot relaxed and keep your knee straight while you use the belt or towel to pull your foot toward you. You should feel a gentle stretch behind your calf or knee in your gastroc muscle. 4. Hold this position for __________ seconds, then release to the starting position. 5. Repeat the exercise with your knee bent. You can put a pillow or a rolled bath towel under your knee to support it. You should feel a stretch deep in your calf in the soleus muscle or at your Achilles tendon. Repeat __________ times. Complete this exercise __________ times a day. Ankle alphabet  1. Sit with your left / right leg supported at the lower leg. ? Do not rest your foot on anything. ? Make sure your foot has room to move freely. 2. Think of your left / right  foot as a paintbrush. ? Move your foot to trace each letter of the alphabet in the air. Keep your hip and knee still while you trace. ? Make the letters as large as you can without feeling discomfort. 3. Trace every letter from A to Z. Repeat __________ times. Complete this exercise __________ times a day. Strengthening exercises These exercises build strength and endurance in your ankle and lower leg. Endurance is the ability to use your muscles for a long time, even after they get tired. Ankle dorsiflexion  1. Secure a rubber exercise band or tube to an object, such as a table leg, that will stay still when the band is pulled. Secure the other end around your left / right foot. 2. Sit on the floor facing the object, with your left / right leg extended. The band or tube should be slightly tense when your foot is relaxed. 3. Slowly bring your foot toward you, bringing the top of your foot toward your shin (dorsiflexion), and pulling the band tighter. 4. Hold this position for __________ seconds. 5. Slowly return your foot to the starting position. Repeat __________ times. Complete this exercise __________ times a day. Ankle plantar flexion  1. Sit on the floor with your left / right leg extended. 2. Loop a rubber exercise tube or band around the ball of your left / right foot. The ball of your foot is on the walking surface, right under your  toes. ? Hold the ends of the band or tube in your hands. ? The band or tube should be slightly tense when your foot is relaxed. 3. Slowly point your foot and toes downward to tilt the top of your foot away from your shin (plantar flexion). 4. Hold this position for __________ seconds. 5. Slowly return your foot to the starting position. Repeat __________ times. Complete this exercise __________ times a day. Ankle eversion 1. Sit on the floor with your legs straight out in front of you. 2. Loop a rubber exercise band or tube around the ball of your left  / right foot. The ball of your foot is on the walking surface, right under your toes. ? Hold the ends of the band in your hands, or secure the band to a stable object. ? The band or tube should be slightly tense when your foot is relaxed. 3. Slowly push your foot outward, away from your other leg (eversion). 4. Hold this position for __________ seconds. 5. Slowly return your foot to the starting position. Repeat __________ times. Complete this exercise __________ times a day. This information is not intended to replace advice given to you by your health care provider. Make sure you discuss any questions you have with your health care provider. Document Revised: 01/08/2019 Document Reviewed: 07/02/2018 Elsevier Patient Education  2020 [employer] Ankle Sprain  An ankle sprain is a stretch or tear in a ligament in the ankle. Ligaments are tissues that connect bones to each other. The two most common types of ankle sprains are:  Inversion sprain. This happens when the foot turns inward and the ankle rolls outward. It affects the ligament on the outside of the foot (lateral ligament).  Eversion sprain. This happens when the foot turns outward and the ankle rolls inward. It affects the ligament on the inner side of the foot (medial ligament). What are the causes? This condition is often caused by accidentally rolling or twisting the ankle. What increases the risk? You are more likely to develop this condition if you play sports. What are the signs or symptoms? Symptoms of this condition include:  Pain in your ankle.  Swelling.  Bruising. This may develop right after you sprain your ankle or 1-2 days later.  Trouble standing or walking, especially when you turn or change directions. How is this diagnosed? This condition is diagnosed with:  A physical exam. During the exam, your health care provider will press on certain parts of your foot and ankle and try to move them in certain  ways.  X-ray imaging. These may be taken to see how severe the sprain is and to check for broken bones. How is this treated? This condition may be treated with:  A brace or splint. This is used to keep the ankle from moving until it heals.  An elastic bandage. This is used to support the ankle.  Crutches.  Pain medicine.  Surgery. This may be needed if the sprain is severe.  Physical therapy. This may help to improve the range of motion in the ankle. Follow these instructions at home: If you have a brace or a splint:  Wear the brace or splint as told by your health care provider. Remove it only as told by your health care provider.  Loosen the brace or splint if your toes tingle, become numb, or turn cold and blue.  Keep the brace or splint clean.  If the brace or splint is not waterproof: ? Do  not let it get wet. ? Cover it with a watertight covering when you take a bath or a shower. If you have an elastic bandage (dressing):  Remove it to shower or bathe.  Try not to move your ankle much, but wiggle your toes from time to time. This helps to prevent swelling.  Adjust the dressing to make it more comfortable if it feels too tight.  Loosen the dressing if you have numbness or tingling in your foot, or if your foot becomes cold and blue. Managing pain, stiffness, and swelling   Take over-the-counter and prescription medicines only as told by your health care provider.  For 2-3 days, keep your ankle raised (elevated) above the level of your heart as much as possible.  If directed, put ice on the injured area: ? If you have a removable brace or splint, remove it as told by your health care provider. ? Put ice in a plastic bag. ? Place a towel between your skin and the bag. ? Leave the ice on for 20 minutes, 2-3 times a day. General instructions  Rest your ankle.  Do not use the injured limb to support your body weight until your health care provider says that you  can. Use crutches as told by your health care provider.  Do not use any products that contain nicotine or tobacco, such as cigarettes, e-cigarettes, and chewing tobacco. If you need help quitting, ask your health care provider.  Keep all follow-up visits as told by your health care provider. This is important. Contact a health care provider if:  You have rapidly increasing bruising or swelling.  Your pain is not relieved with medicine. Get help right away if:  Your foot or toes become numb or blue.  You have severe pain that gets worse. Summary  An ankle sprain is a stretch or tear in a ligament in the ankle. Ligaments are tissues that connect bones to each other.  This condition is often caused by accidentally rolling or twisting the ankle.  Symptoms include pain, swelling, bruising, and trouble walking.  To relieve pain and swelling, put ice on the affected ankle, raise your ankle above the level of your heart, and use an elastic bandage.  Keep all follow-up visits as told by your health care provider. This is important. This information is not intended to replace advice given to you by your health care provider. Make sure you discuss any questions you have with your health care provider. Document Revised: 06/11/2018 Document Reviewed: 02/13/2018 Elsevier Patient Education  2020 [employer].

## 2020-06-26 NOTE — Progress Notes (Signed)
Subjective:    Patient ID: Ruth King, female    DOB: 2001-07-02, 18 y.o.   MRN: [id number]  Chief Complaint  Patient presents with  . Hospitalization Follow-up    [name]. Monday    Pt presents to the office today with continued right ankle pain. She went to the ED on 06/22/20 and was diagnosed with right ankle sprain. She had a negative x-ray.  Ankle Pain  The incident occurred more than 1 week ago. There was no injury mechanism. The pain is present in the right ankle. The pain is at a severity of 7/10. The pain has been constant since onset. Pertinent negatives include no numbness or tingling (toes). The symptoms are aggravated by weight bearing. She has tried non-weight bearing, rest and acetaminophen for the symptoms. The treatment provided mild relief.      Review of Systems  Neurological: Negative for tingling (toes) and numbness.  All other systems reviewed and are negative.      Objective:   Physical Exam Vitals reviewed.  Constitutional:      General: She is not in acute distress.    Appearance: She is well-developed. She is obese.  HENT:     Head: Normocephalic and atraumatic.     Right Ear: Tympanic membrane normal.     Left Ear: Tympanic membrane normal.  Eyes:     Pupils: Pupils are equal, round, and reactive to light.  Neck:     Thyroid: No thyromegaly.  Cardiovascular:     Rate and Rhythm: Normal rate and regular rhythm.     Heart sounds: Normal heart sounds. No murmur heard.   Pulmonary:     Effort: Pulmonary effort is normal. No respiratory distress.     Breath sounds: Normal breath sounds. No wheezing.  Abdominal:     General: Bowel sounds are normal. There is no distension.     Palpations: Abdomen is soft.     Tenderness: There is no abdominal tenderness.  Musculoskeletal:        General: No tenderness. Normal range of motion.     Cervical back: Normal range of motion and neck supple.     Comments: Full ROM of ankle, pain  in medial ankle with flexion and rotation  Skin:    General: Skin is warm and dry.  Neurological:     Mental Status: She is alert and oriented to person, place, and time.     Cranial Nerves: No cranial nerve deficit.     Sensory: No sensory deficit.     Deep Tendon Reflexes: Reflexes are normal and symmetric.  Psychiatric:        Behavior: Behavior normal.        Thought Content: Thought content normal.        Judgment: Judgment normal.       BP 125/74   Pulse 97   Temp 98.9 F (37.2 C) (Temporal)   Ht 6' (1.829 m)   Wt 277 lb 9.6 oz (125.9 kg)   LMP 06/15/2020   BMI 37.65 kg/m      Assessment & Plan:  [name] comes in today with chief complaint of Hospitalization Follow-up [name]. Monday )   Diagnosis and orders addressed:  1. Sprain of right ankle, unspecified ligament, subsequent encounter Rest ROM exercises encouraged Diclofenac BID with food No other NSAID"s  Work note given  - diclofenac (VOLTAREN) 75 MG EC tablet; Take 1 tablet (75 mg total)  by mouth 2 (two) times daily.  Dispense: 30 tablet; Refill: 0   2. Hospital discharge follow-up    [name], FNP

## 2020-08-22 ENCOUNTER — Other Ambulatory Visit: Payer: Self-pay | Admitting: Family Medicine

## 2020-08-22 DIAGNOSIS — G43809 Other migraine, not intractable, without status migrainosus: Secondary | ICD-10-CM

## 2020-08-22 DIAGNOSIS — G43009 Migraine without aura, not intractable, without status migrainosus: Secondary | ICD-10-CM

## 2020-09-10 ENCOUNTER — Other Ambulatory Visit: Payer: Self-pay

## 2020-09-10 ENCOUNTER — Ambulatory Visit: Payer: No Typology Code available for payment source | Admitting: Family Medicine

## 2020-09-10 ENCOUNTER — Encounter: Payer: Self-pay | Admitting: Family Medicine

## 2020-09-10 VITALS — BP 127/83 | HR 94 | Ht 72.0 in | Wt 279.0 lb

## 2020-09-10 DIAGNOSIS — E282 Polycystic ovarian syndrome: Secondary | ICD-10-CM | POA: Diagnosis not present

## 2020-09-10 DIAGNOSIS — Z0001 Encounter for general adult medical examination with abnormal findings: Secondary | ICD-10-CM

## 2020-09-10 DIAGNOSIS — E119 Type 2 diabetes mellitus without complications: Secondary | ICD-10-CM | POA: Diagnosis not present

## 2020-09-10 DIAGNOSIS — Z0000 Encounter for general adult medical examination without abnormal findings: Secondary | ICD-10-CM

## 2020-09-10 DIAGNOSIS — G43019 Migraine without aura, intractable, without status migrainosus: Secondary | ICD-10-CM | POA: Diagnosis not present

## 2020-09-10 LAB — BAYER DCA HB A1C WAIVED: HB A1C (BAYER DCA - WAIVED): 6.5 % (ref <7.0)

## 2020-09-10 NOTE — Progress Notes (Signed)
BP 127/83   Pulse 94   Ht 6' (1.829 m)   Wt 279 lb (126.6 kg)   LMP 09/10/2020   SpO2 100%   BMI 37.84 kg/m    Subjective:   Patient ID: Ruth King, female    DOB: 2001/05/19, 19 y.o.   MRN: [id number]  HPI: Ruth King is a 19 y.o. female presenting on 09/10/2020 for No chief complaint on file.   HPI  Patient is coming in for well exam and recheck of chronic issues.  She has some sinus pressure and ear congestion that she gets with allergies.  She is taking her allergy medicine, recommended to add Benadryl Mucinex.  Type 2 diabetes mellitus Patient comes in today for recheck of his diabetes. Patient has been currently taking Metformin, A1c 6.5. Patient is not currently on an ACE inhibitor/ARB. Patient has seen an ophthalmologist this year. Patient denies any issues with their feet. The symptom started onset as an adult polycystic ovarian syndrome ARE RELATED TO DM   Patient has also been diagnosed with polycystic ovarian syndrome and takes the birth control to help with that.  She is not currently sexually active per her.  Patient has migraine medication that helps control her migraines including Topamax and Ubrelvy and Maxalt and feels like those are doing well for her.  Relevant past medical, surgical, family and social history reviewed and updated as indicated. Interim medical history since our last visit reviewed. Allergies and medications reviewed and updated.  Review of Systems  Constitutional: Negative for chills and fever.  Eyes: Negative for redness and visual disturbance.  Respiratory: Negative for chest tightness and shortness of breath.   Cardiovascular: Negative for chest pain and leg swelling.  Genitourinary: Negative for difficulty urinating and dysuria.  Musculoskeletal: Negative for back pain and gait problem.  Skin: Negative for rash.  Neurological: Negative for light-headedness and headaches.  Psychiatric/Behavioral: Negative for agitation and  behavioral problems.  All other systems reviewed and are negative.   Per HPI unless specifically indicated above   Allergies as of 09/10/2020      Reactions   Bee Venom Shortness Of Breath   Azithromycin Other (See Comments)   Not effective   Cephalosporins Hives      Medication List       Accurate as of September 10, 2020  1:35 PM. If you have any questions, ask your nurse or doctor.        albuterol 108 (90 Base) MCG/ACT inhaler Commonly known as: VENTOLIN HFA Inhale 2 puffs into the lungs every 6 (six) hours as needed for wheezing or shortness of breath.   Butalbital-APAP-Caffeine 50-300-40 MG Caps Commonly known as: Fioricet Take 1 capsule by mouth every 4 (four) hours as needed (headache).   cetirizine 10 MG tablet Commonly known as: ZYRTEC Take 1 tablet (10 mg total) by mouth at bedtime.   diclofenac 75 MG EC tablet Commonly known as: VOLTAREN Take 1 tablet (75 mg total) by mouth 2 (two) times daily.   docusate sodium 100 MG capsule Commonly known as: Colace Take 1 capsule (100 mg total) by mouth 2 (two) times daily.   fluticasone 50 MCG/ACT nasal spray Commonly known as: FLONASE Place 2 sprays into both nostrils daily. What changed:   when to take this  reasons to take this   metFORMIN 500 MG tablet Commonly known as: GLUCOPHAGE Take 1 tablet (500 mg total) by mouth daily with breakfast.   montelukast 10 MG tablet Commonly known as:  SINGULAIR Take 1 tablet (10 mg total) by mouth at bedtime.   norethindrone-ethinyl estradiol-iron 1.5-30 MG-MCG tablet Commonly known as: Larin Fe 1.5/30 Take 1 tablet by mouth daily. Skip inactive pills x 2 mo, starting new pack immediately, then take for third month   omeprazole 20 MG capsule Commonly known as: PRILOSEC Take 1 capsule (20 mg total) by mouth daily.   ondansetron 4 MG disintegrating tablet Commonly known as: ZOFRAN-ODT Take 1 tablet (4 mg total) by mouth every 6 (six) hours as needed for nausea.    oxyCODONE 5 MG immediate release tablet Commonly known as: Oxy IR/ROXICODONE Take 1 tablet (5 mg total) by mouth every 4 (four) hours as needed for severe pain or breakthrough pain.   rizatriptan 10 MG tablet Commonly known as: MAXALT TAKE 1 TABLET BY MOUTH AS NEEDED. MAY REPEAT IN TWO HOURS IF NEEDED  Needs to be seen for further refills.   topiramate 50 MG tablet Commonly known as: TOPAMAX Take 1 tablet (50 mg total) by mouth 2 (two) times daily. What changed:   when to take this  reasons to take this   Ubrelvy 50 MG Tabs Generic drug: Ubrogepant Take 50 mg by mouth as directed. Take at onset of headache, may repeat after 2 hours if headache still present. Max 2 tabs per day. What changed:   when to take this  reasons to take this        Objective:   BP 127/83   Pulse 94   Ht 6' (1.829 m)   Wt 279 lb (126.6 kg)   LMP 09/10/2020   SpO2 100%   BMI 37.84 kg/m   Wt Readings from Last 3 Encounters:  09/10/20 279 lb (126.6 kg)  06/26/20 277 lb 9.6 oz (125.9 kg)  06/22/20 255 lb 1.2 oz (115.7 kg)    Physical Exam Vitals and nursing note reviewed.  Constitutional:      General: She is not in acute distress.    Appearance: She is well-developed and well-nourished. She is not diaphoretic.  Eyes:     Extraocular Movements: EOM normal.     Conjunctiva/sclera: Conjunctivae normal.  Cardiovascular:     Rate and Rhythm: Normal rate and regular rhythm.     Pulses: Intact distal pulses.     Heart sounds: Normal heart sounds. No murmur heard.   Pulmonary:     Effort: Pulmonary effort is normal. No respiratory distress.     Breath sounds: Normal breath sounds. No wheezing.  Musculoskeletal:        General: No tenderness or edema. Normal range of motion.  Skin:    General: Skin is warm and dry.     Findings: No rash.  Neurological:     Mental Status: She is alert and oriented to person, place, and time.     Coordination: Coordination normal.  Psychiatric:         Mood and Affect: Mood and affect normal.        Behavior: Behavior normal.     Assessment & Plan:   Problem List Items Addressed This Visit      Cardiovascular and Mediastinum   Migraine headache     Endocrine   PCOS (polycystic ovarian syndrome)   Diabetes mellitus without complication ([place]) - Primary   Relevant Orders   CBC with Differential/Platelet   CMP14+EGFR   Lipid panel   Bayer DCA Hb A1c Waived   Microalbumin / creatinine urine ratio      Patient did not want  a Pap smear today, will just do physical exam recheck on diabetes.  A1c is 6.5, continue current medication and focus on diet.  Patient declined physical today, mainly need blood work Follow up plan: Return in about 6 months (around 03/11/2021), or if symptoms worsen or fail to improve, for Diabetes recheck.  Counseling provided for all of the vaccine components Orders Placed This Encounter  Procedures  . CBC with Differential/Platelet  . CMP14+EGFR  . Lipid panel  . Bayer DCA Hb A1c Waived  . Microalbumin / creatinine urine ratio    Caryl Pina, MD [facility] 09/10/2020, 1:35 PM

## 2020-09-11 LAB — LIPID PANEL
Chol/HDL Ratio: 5.1 ratio — ABNORMAL HIGH (ref 0.0–4.4)
Cholesterol, Total: 158 mg/dL (ref 100–199)
HDL: 31 mg/dL — ABNORMAL LOW (ref >39)
LDL Chol Calc (NIH): 102 mg/dL — ABNORMAL HIGH (ref 0–99)
Triglycerides: 139 mg/dL (ref 0–149)
VLDL Cholesterol Cal: 25 mg/dL (ref 5–40)

## 2020-09-11 LAB — CBC WITH DIFFERENTIAL/PLATELET
Basophils Absolute: 0 10*3/uL (ref 0.0–0.2)
Basos: 0 %
EOS (ABSOLUTE): 0.1 10*3/uL (ref 0.0–0.4)
Eos: 1 %
Hematocrit: 42.4 % (ref 34.0–46.6)
Hemoglobin: 13.8 g/dL (ref 11.1–15.9)
Immature Grans (Abs): 0.1 10*3/uL (ref 0.0–0.1)
Immature Granulocytes: 1 %
Lymphocytes Absolute: 2.9 10*3/uL (ref 0.7–3.1)
Lymphs: 27 %
MCH: 28.4 pg (ref 26.6–33.0)
MCHC: 32.5 g/dL (ref 31.5–35.7)
MCV: 87 fL (ref 79–97)
Monocytes Absolute: 0.6 10*3/uL (ref 0.1–0.9)
Monocytes: 5 %
Neutrophils Absolute: 6.9 10*3/uL (ref 1.4–7.0)
Neutrophils: 66 %
Platelets: 431 10*3/uL (ref 150–450)
RBC: 4.86 x10E6/uL (ref 3.77–5.28)
RDW: 13 % (ref 11.7–15.4)
WBC: 10.5 10*3/uL (ref 3.4–10.8)

## 2020-09-11 LAB — CMP14+EGFR
ALT: 31 IU/L (ref 0–32)
AST: 25 IU/L (ref 0–40)
Albumin/Globulin Ratio: 1.7 (ref 1.2–2.2)
Albumin: 4.3 g/dL (ref 3.9–5.0)
Alkaline Phosphatase: 111 IU/L (ref 44–121)
BUN/Creatinine Ratio: 20 (ref 9–23)
BUN: 11 mg/dL (ref 6–20)
Bilirubin Total: <0.2 mg/dL (ref 0.0–1.2)
CO2: 22 mmol/L (ref 20–29)
Calcium: 9.6 mg/dL (ref 8.7–10.2)
Chloride: 102 mmol/L (ref 96–106)
Creatinine, Ser: 0.55 mg/dL — ABNORMAL LOW (ref 0.57–1.00)
GFR calc Af Amer: 155 mL/min/{1.73_m2} (ref >59)
GFR calc non Af Amer: 135 mL/min/{1.73_m2} (ref >59)
Globulin, Total: 2.6 g/dL (ref 1.5–4.5)
Glucose: 111 mg/dL — ABNORMAL HIGH (ref 65–99)
Potassium: 4.5 mmol/L (ref 3.5–5.2)
Sodium: 140 mmol/L (ref 134–144)
Total Protein: 6.9 g/dL (ref 6.0–8.5)

## 2020-09-24 ENCOUNTER — Other Ambulatory Visit: Payer: Self-pay | Admitting: Family Medicine

## 2020-09-24 DIAGNOSIS — G43009 Migraine without aura, not intractable, without status migrainosus: Secondary | ICD-10-CM

## 2020-09-24 DIAGNOSIS — J309 Allergic rhinitis, unspecified: Secondary | ICD-10-CM

## 2020-09-24 DIAGNOSIS — G43809 Other migraine, not intractable, without status migrainosus: Secondary | ICD-10-CM

## 2020-11-23 ENCOUNTER — Other Ambulatory Visit: Payer: Self-pay | Admitting: Family Medicine

## 2020-11-23 DIAGNOSIS — N915 Oligomenorrhea, unspecified: Secondary | ICD-10-CM

## 2021-03-11 ENCOUNTER — Ambulatory Visit: Payer: No Typology Code available for payment source | Admitting: Family Medicine

## 2021-03-15 ENCOUNTER — Encounter: Payer: Self-pay | Admitting: Family Medicine

## 2021-06-02 ENCOUNTER — Encounter: Payer: Self-pay | Admitting: Family Medicine

## 2021-06-02 ENCOUNTER — Ambulatory Visit: Payer: No Typology Code available for payment source | Admitting: Family Medicine

## 2021-06-02 ENCOUNTER — Other Ambulatory Visit: Payer: Self-pay

## 2021-06-02 VITALS — BP 133/85 | HR 103 | Ht 72.0 in | Wt 285.0 lb

## 2021-06-02 DIAGNOSIS — R5383 Other fatigue: Secondary | ICD-10-CM | POA: Diagnosis not present

## 2021-06-02 DIAGNOSIS — G43019 Migraine without aura, intractable, without status migrainosus: Secondary | ICD-10-CM

## 2021-06-02 DIAGNOSIS — Z13220 Encounter for screening for lipoid disorders: Secondary | ICD-10-CM

## 2021-06-02 DIAGNOSIS — E119 Type 2 diabetes mellitus without complications: Secondary | ICD-10-CM

## 2021-06-02 LAB — BAYER DCA HB A1C WAIVED: HB A1C (BAYER DCA - WAIVED): 6.9 % (ref <7.0)

## 2021-06-02 MED ORDER — DULOXETINE HCL 30 MG PO CPEP: 30.0000 mg | ORAL_CAPSULE | Freq: Every day | ORAL | 2 refills | Status: DC

## 2021-06-02 NOTE — Progress Notes (Signed)
BP 133/85   Pulse (!) 103   Ht 6' (1.829 m)   Wt 285 lb (129.3 kg)   SpO2 97%   BMI 38.65 kg/m    Subjective:   Patient ID: Ruth King, female    DOB: Nov 28, 2000, 20 y.o.   MRN: [id number]  HPI: Ruth King is a 20 y.o. female presenting on 06/02/2021 for Headache (Becoming more frequent) and Fatigue (Stays up late)   HPI Migraines and headaches Patient is coming in complaint of migraine headaches.  She says she has been having lots of migraines and headaches which is made her tired and been having them most days of the week, not every day but most days.  She says the headaches will be dull and throbbing but have been increasing in frequency.  She uses Advil often last all day but sometimes a half a day.  She does take the Maxalt and relatively and feels like Maxalt does help sometimes to stop them but the Roselyn Meier does not.  Patient is also taking topiramate for prevention at this point.  Type 2 diabetes mellitus Patient comes in today for recheck of his diabetes. Patient has been currently taking metformin. Patient is not currently on an ACE inhibitor/ARB. Patient has not seen an ophthalmologist this year. Patient denies any issues with their feet. The symptom started onset as an adult  ARE RELATED TO DM   Relevant past medical, surgical, family and social history reviewed and updated as indicated. Interim medical history since our last visit reviewed. Allergies and medications reviewed and updated.  Review of Systems  Constitutional:  Negative for chills and fever.  HENT:  Negative for ear pain.   Eyes:  Negative for visual disturbance.  Respiratory:  Negative for chest tightness and shortness of breath.   Cardiovascular:  Negative for chest pain and leg swelling.  Musculoskeletal:  Negative for back pain and gait problem.  Skin:  Negative for rash.  Neurological:  Positive for headaches. Negative for dizziness, weakness, light-headedness and numbness.   Psychiatric/Behavioral:  Negative for agitation and behavioral problems.   All other systems reviewed and are negative.  Per HPI unless specifically indicated above   Allergies as of 06/02/2021       Reactions   Bee Venom Shortness Of Breath   Azithromycin Other (See Comments)   Not effective   Cephalosporins Hives        Medication List        Accurate as of June 02, 2021  2:41 PM. If you have any questions, ask your nurse or doctor.          STOP taking these medications    Butalbital-APAP-Caffeine 50-300-40 MG Caps Commonly known as: Fioricet Stopped by: Fransisca Kaufmann Zerick Prevette, MD   diclofenac 75 MG EC tablet Commonly known as: VOLTAREN Stopped by: Fransisca Kaufmann Evanie Buckle, MD       TAKE these medications    albuterol 108 (90 Base) MCG/ACT inhaler Commonly known as: VENTOLIN HFA Inhale 2 puffs into the lungs every 6 (six) hours as needed for wheezing or shortness of breath.   cetirizine 10 MG tablet Commonly known as: ZYRTEC TAKE 1 TABLET BY MOUTH AT BEDTIME   DULoxetine 30 MG capsule Commonly known as: Cymbalta Take 1 capsule (30 mg total) by mouth at bedtime. Started by: Fransisca Kaufmann Zionna Homewood, MD   fluticasone 50 MCG/ACT nasal spray Commonly known as: FLONASE Place 2 sprays into both nostrils daily. What changed:  when to take this reasons  to take this   Larin Fe 1.5/30 1.5-30 MG-MCG tablet Generic drug: norethindrone-ethinyl estradiol-iron TAKE 1 TABLET BY MOUTH DAILY. SKIP INACTIVE PILLS FOR TWO MONTHS,STARTING NEW PACK IMMEDIATELY, THEN TAKE FOR THIRD MONTH   metFORMIN 500 MG tablet Commonly known as: GLUCOPHAGE Take 1 tablet (500 mg total) by mouth daily with breakfast.   montelukast 10 MG tablet Commonly known as: SINGULAIR TAKE 1 TABLET BY MOUTH AT BEDTIME   omeprazole 20 MG capsule Commonly known as: PRILOSEC TAKE ONE CAPSULE BY MOUTH DAILY   ondansetron 4 MG disintegrating tablet Commonly known as: ZOFRAN-ODT Take 1 tablet (4 mg  total) by mouth every 6 (six) hours as needed for nausea.   rizatriptan 10 MG tablet Commonly known as: MAXALT TAKE 1 TABLET BY MOUTH AS NEEDED. MAY REPEAT IN TWO HOURS IF NEEDED   topiramate 50 MG tablet Commonly known as: TOPAMAX TAKE 1 TABLET BY MOUTH TWICE DAILY   Ubrelvy 50 MG Tabs Generic drug: Ubrogepant Take 50 mg by mouth as directed. Take at onset of headache, may repeat after 2 hours if headache still present. Max 2 tabs per day. What changed:  when to take this reasons to take this         Objective:   BP 133/85   Pulse (!) 103   Ht 6' (1.829 m)   Wt 285 lb (129.3 kg)   SpO2 97%   BMI 38.65 kg/m   Wt Readings from Last 3 Encounters:  06/02/21 285 lb (129.3 kg)  09/10/20 279 lb (126.6 kg)  06/26/20 277 lb 9.6 oz (125.9 kg)    Physical Exam Vitals and nursing note reviewed.  Constitutional:      General: She is not in acute distress.    Appearance: She is well-developed. She is not diaphoretic.  Eyes:     Extraocular Movements: Extraocular movements intact.     Conjunctiva/sclera: Conjunctivae normal.     Pupils: Pupils are equal, round, and reactive to light.  Cardiovascular:     Rate and Rhythm: Normal rate and regular rhythm.     Heart sounds: Normal heart sounds. No murmur heard. Pulmonary:     Effort: Pulmonary effort is normal. No respiratory distress.     Breath sounds: Normal breath sounds. No wheezing.  Musculoskeletal:        General: No tenderness. Normal range of motion.  Skin:    General: Skin is warm and dry.     Findings: No rash.  Neurological:     Mental Status: She is alert and oriented to person, place, and time.     Cranial Nerves: No cranial nerve deficit or facial asymmetry.     Coordination: Coordination normal.  Psychiatric:        Behavior: Behavior normal.      Assessment & Plan:   Problem List Items Addressed This Visit       Cardiovascular and Mediastinum   Migraine headache   Relevant Medications    DULoxetine (CYMBALTA) 30 MG capsule     Endocrine   Diabetes mellitus without complication ([place]) - Primary   Relevant Orders   CBC with Differential/Platelet   CMP14+EGFR   Lipid panel   Bayer DCA Hb A1c Waived   VITAMIN D 25 Hydroxy (Vit-D Deficiency, Fractures)   Vitamin B12   TSH   Other Visit Diagnoses     Lipid screening       Relevant Orders   CBC with Differential/Platelet   CMP14+EGFR   Lipid panel   Bayer  DCA Hb A1c Waived   Fatigue, unspecified type       Relevant Orders   Vitamin B12   TSH     Patient not doing well with migraine prevention versus, will start Cymbalta, keep Topamax for now but will taper off of that later. Patient said that the Roselyn Meier did not help at all so we will stop it   Follow up plan: Return if symptoms worsen or fail to improve, for 3 to 4-week recheck on migraines and headaches.  Counseling provided for all of the vaccine components Orders Placed This Encounter  Procedures   CBC with Differential/Platelet   CMP14+EGFR   Lipid panel   Bayer DCA Hb A1c Waived   VITAMIN D 25 Hydroxy (Vit-D Deficiency, Fractures)   Vitamin B12   TSH    Caryl Pina, MD [facility] 06/02/2021, 2:41 PM

## 2021-06-03 LAB — CMP14+EGFR
ALT: 27 IU/L (ref 0–32)
AST: 19 IU/L (ref 0–40)
Albumin/Globulin Ratio: 1.9 (ref 1.2–2.2)
Albumin: 4.1 g/dL (ref 3.9–5.0)
Alkaline Phosphatase: 111 IU/L (ref 44–121)
BUN/Creatinine Ratio: 17 (ref 9–23)
BUN: 9 mg/dL (ref 6–20)
Bilirubin Total: <0.2 mg/dL (ref 0.0–1.2)
CO2: 24 mmol/L (ref 20–29)
Calcium: 9.4 mg/dL (ref 8.7–10.2)
Chloride: 100 mmol/L (ref 96–106)
Creatinine, Ser: 0.54 mg/dL — ABNORMAL LOW (ref 0.57–1.00)
Globulin, Total: 2.2 g/dL (ref 1.5–4.5)
Glucose: 256 mg/dL — ABNORMAL HIGH (ref 65–99)
Potassium: 4 mmol/L (ref 3.5–5.2)
Sodium: 139 mmol/L (ref 134–144)
Total Protein: 6.3 g/dL (ref 6.0–8.5)
eGFR: 134 mL/min/{1.73_m2} (ref >59)

## 2021-06-03 LAB — CBC WITH DIFFERENTIAL/PLATELET
Basophils Absolute: 0 10*3/uL (ref 0.0–0.2)
Basos: 0 %
EOS (ABSOLUTE): 0.1 10*3/uL (ref 0.0–0.4)
Eos: 1 %
Hematocrit: 38.1 % (ref 34.0–46.6)
Hemoglobin: 12.3 g/dL (ref 11.1–15.9)
Immature Grans (Abs): 0.1 10*3/uL (ref 0.0–0.1)
Immature Granulocytes: 1 %
Lymphocytes Absolute: 2.8 10*3/uL (ref 0.7–3.1)
Lymphs: 25 %
MCH: 28.1 pg (ref 26.6–33.0)
MCHC: 32.3 g/dL (ref 31.5–35.7)
MCV: 87 fL (ref 79–97)
Monocytes Absolute: 0.5 10*3/uL (ref 0.1–0.9)
Monocytes: 5 %
Neutrophils Absolute: 7.9 10*3/uL — ABNORMAL HIGH (ref 1.4–7.0)
Neutrophils: 68 %
Platelets: 370 10*3/uL (ref 150–450)
RBC: 4.37 x10E6/uL (ref 3.77–5.28)
RDW: 13.1 % (ref 11.7–15.4)
WBC: 11.4 10*3/uL — ABNORMAL HIGH (ref 3.4–10.8)

## 2021-06-03 LAB — VITAMIN D 25 HYDROXY (VIT D DEFICIENCY, FRACTURES): Vit D, 25-Hydroxy: 12 ng/mL — ABNORMAL LOW (ref 30.0–100.0)

## 2021-06-03 LAB — TSH: TSH: 1.29 u[IU]/mL (ref 0.450–4.500)

## 2021-06-03 LAB — LIPID PANEL
Chol/HDL Ratio: 5.4 ratio — ABNORMAL HIGH (ref 0.0–4.4)
Cholesterol, Total: 156 mg/dL (ref 100–199)
HDL: 29 mg/dL — ABNORMAL LOW (ref >39)
LDL Chol Calc (NIH): 89 mg/dL (ref 0–99)
Triglycerides: 222 mg/dL — ABNORMAL HIGH (ref 0–149)
VLDL Cholesterol Cal: 38 mg/dL (ref 5–40)

## 2021-06-03 LAB — VITAMIN B12: Vitamin B-12: 422 pg/mL (ref 232–1245)

## 2021-07-02 ENCOUNTER — Encounter: Payer: Self-pay | Admitting: Family Medicine

## 2021-07-02 ENCOUNTER — Ambulatory Visit: Payer: No Typology Code available for payment source | Admitting: Family Medicine

## 2021-07-02 VITALS — BP 122/78 | HR 80 | Wt 281.0 lb

## 2021-07-02 DIAGNOSIS — F339 Major depressive disorder, recurrent, unspecified: Secondary | ICD-10-CM

## 2021-07-02 DIAGNOSIS — G43019 Migraine without aura, intractable, without status migrainosus: Secondary | ICD-10-CM

## 2021-07-02 MED ORDER — DULOXETINE HCL 30 MG PO CPEP: 30.0000 mg | ORAL_CAPSULE | Freq: Every day | ORAL | 1 refills | Status: DC

## 2021-07-02 NOTE — Progress Notes (Signed)
BP 122/78   Pulse 80   Wt 281 lb (127.5 kg)   SpO2 (!) 9%   BMI 38.11 kg/m    Subjective:   Patient ID: Ruth King, female    DOB: [birth year], 20 y.o.   MRN: [id number]  HPI: Ruth King is a 20 y.o. female presenting on 07/02/2021 for Medical Management of Chronic Issues and Migraine   HPI Patient is coming in today for migraines and depression follow-up.  We started Cymbalta for migraine prevention and depression which 1 migraine since the medication started and she does feel it is helping a lot with her mood and even her parents have noticed a change in her mood because of the medicine for the daughter.  She says she gets a little sleepy still during the day with the medicine but that is improving and she does want to continue forward with that.  She is taking the duloxetine every evening. She denies any suicidal ideations or thoughts of hurting herself.  Relevant past medical, surgical, family and social history reviewed and updated as indicated. Interim medical history since our last visit reviewed. Allergies and medications reviewed and updated.  Review of Systems  Constitutional:  Negative for chills and fever.  Eyes:  Negative for visual disturbance.  Respiratory:  Negative for chest tightness and shortness of breath.   Cardiovascular:  Negative for chest pain and leg swelling.  Skin:  Negative for rash.  Neurological:  Positive for headaches. Negative for dizziness and light-headedness.  Psychiatric/Behavioral:  Negative for agitation, behavioral problems, dysphoric mood, self-injury, sleep disturbance and suicidal ideas. The patient is not nervous/anxious.   All other systems reviewed and are negative.  Per HPI unless specifically indicated above   Allergies as of 07/02/2021       Reactions   Bee Venom Shortness Of Breath   Azithromycin Other (See Comments)   Not effective   Cephalosporins Hives        Medication List        Accurate as of  July 02, 2021  2:19 PM. If you have any questions, ask your nurse or doctor.          albuterol 108 (90 Base) MCG/ACT inhaler Commonly known as: VENTOLIN HFA Inhale 2 puffs into the lungs every 6 (six) hours as needed for wheezing or shortness of breath.   cetirizine 10 MG tablet Commonly known as: ZYRTEC TAKE 1 TABLET BY MOUTH AT BEDTIME   DULoxetine 30 MG capsule Commonly known as: Cymbalta Take 1 capsule (30 mg total) by mouth at bedtime.   fluticasone 50 MCG/ACT nasal spray Commonly known as: FLONASE Place 2 sprays into both nostrils daily. What changed:  when to take this reasons to take this   Larin Fe 1.5/30 1.5-30 MG-MCG tablet Generic drug: norethindrone-ethinyl estradiol-iron TAKE 1 TABLET BY MOUTH DAILY. SKIP INACTIVE PILLS FOR TWO MONTHS,STARTING NEW PACK IMMEDIATELY, THEN TAKE FOR THIRD MONTH   metFORMIN 500 MG tablet Commonly known as: GLUCOPHAGE Take 1 tablet (500 mg total) by mouth daily with breakfast.   montelukast 10 MG tablet Commonly known as: SINGULAIR TAKE 1 TABLET BY MOUTH AT BEDTIME   omeprazole 20 MG capsule Commonly known as: PRILOSEC TAKE ONE CAPSULE BY MOUTH DAILY   ondansetron 4 MG disintegrating tablet Commonly known as: ZOFRAN-ODT Take 1 tablet (4 mg total) by mouth every 6 (six) hours as needed for nausea.   rizatriptan 10 MG tablet Commonly known as: MAXALT TAKE 1 TABLET BY MOUTH AS NEEDED.  MAY REPEAT IN TWO HOURS IF NEEDED   topiramate 50 MG tablet Commonly known as: TOPAMAX TAKE 1 TABLET BY MOUTH TWICE DAILY   Ubrelvy 50 MG Tabs Generic drug: Ubrogepant Take 50 mg by mouth as directed. Take at onset of headache, may repeat after 2 hours if headache still present. Max 2 tabs per day. What changed:  when to take this reasons to take this         Objective:   BP 122/78   Pulse 80   Wt 281 lb (127.5 kg)   SpO2 (!) 9%   BMI 38.11 kg/m   Wt Readings from Last 3 Encounters:  07/02/21 281 lb (127.5 kg)   06/02/21 285 lb (129.3 kg)  09/10/20 279 lb (126.6 kg)    Physical Exam Vitals and nursing note reviewed.  Constitutional:      General: She is not in acute distress.    Appearance: She is well-developed. She is not diaphoretic.  Eyes:     Conjunctiva/sclera: Conjunctivae normal.  Cardiovascular:     Rate and Rhythm: Normal rate and regular rhythm.     Heart sounds: Normal heart sounds. No murmur heard. Pulmonary:     Effort: Pulmonary effort is normal. No respiratory distress.     Breath sounds: Normal breath sounds. No wheezing.  Musculoskeletal:        General: No tenderness. Normal range of motion.  Skin:    General: Skin is warm and dry.     Findings: No rash.  Neurological:     Mental Status: She is alert and oriented to person, place, and time.     Coordination: Coordination normal.  Psychiatric:        Behavior: Behavior normal.      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  Migraines are doing well and depression. Follow up plan: Return in about 2 months (around 09/01/2021), or if symptoms worsen or fail to improve, for diabetes.  Counseling provided for all of the vaccine components No orders of the defined types were placed in this encounter.   Arville Care, MD [facility] 07/02/2021, 2:19 PM

## 2021-10-13 ENCOUNTER — Other Ambulatory Visit (HOSPITAL_COMMUNITY)
Admission: RE | Admit: 2021-10-13 | Discharge: 2021-10-13 | Disposition: A | Discharge status: Home / Self Care | Payer: No Typology Code available for payment source | Source: Ambulatory Visit | Attending: Family Medicine | Admitting: Family Medicine

## 2021-10-13 ENCOUNTER — Encounter: Payer: Self-pay | Admitting: Family Medicine

## 2021-10-13 ENCOUNTER — Ambulatory Visit: Payer: No Typology Code available for payment source | Admitting: Family Medicine

## 2021-10-13 VITALS — BP 120/77 | HR 93 | Temp 99.1°F | Ht 72.0 in | Wt 286.0 lb

## 2021-10-13 DIAGNOSIS — Z0000 Encounter for general adult medical examination without abnormal findings: Secondary | ICD-10-CM

## 2021-10-13 DIAGNOSIS — Z0001 Encounter for general adult medical examination with abnormal findings: Secondary | ICD-10-CM | POA: Diagnosis not present

## 2021-10-13 DIAGNOSIS — Z124 Encounter for screening for malignant neoplasm of cervix: Secondary | ICD-10-CM

## 2021-10-13 DIAGNOSIS — Z13220 Encounter for screening for lipoid disorders: Secondary | ICD-10-CM

## 2021-10-13 DIAGNOSIS — E559 Vitamin D deficiency, unspecified: Secondary | ICD-10-CM

## 2021-10-13 DIAGNOSIS — E119 Type 2 diabetes mellitus without complications: Secondary | ICD-10-CM

## 2021-10-13 DIAGNOSIS — Z1329 Encounter for screening for other suspected endocrine disorder: Secondary | ICD-10-CM | POA: Diagnosis not present

## 2021-10-13 DIAGNOSIS — L732 Hidradenitis suppurativa: Secondary | ICD-10-CM

## 2021-10-13 DIAGNOSIS — Z113 Encounter for screening for infections with a predominantly sexual mode of transmission: Secondary | ICD-10-CM

## 2021-10-13 LAB — URINALYSIS, ROUTINE W REFLEX MICROSCOPIC
Bilirubin, UA: NEGATIVE
Ketones, UA: NEGATIVE
Nitrite, UA: NEGATIVE
Protein,UA: NEGATIVE
RBC, UA: NEGATIVE
Specific Gravity, UA: 1.02 (ref 1.005–1.030)
Urobilinogen, Ur: 1 mg/dL (ref 0.2–1.0)
pH, UA: 7.5 (ref 5.0–7.5)

## 2021-10-13 LAB — BAYER DCA HB A1C WAIVED: HB A1C (BAYER DCA - WAIVED): 7.5 % — ABNORMAL HIGH (ref 4.8–5.6)

## 2021-10-13 LAB — MICROSCOPIC EXAMINATION
RBC, Urine: NONE SEEN /hpf (ref 0–2)
Renal Epithel, UA: NONE SEEN /hpf

## 2021-10-13 MED ORDER — DOXYCYCLINE HYCLATE 100 MG PO TABS: 100.0000 mg | ORAL_TABLET | Freq: Two times a day (BID) | ORAL | 0 refills | Status: AC

## 2021-10-13 NOTE — Progress Notes (Signed)
Subjective:  Patient ID: Ruth King, female    DOB: [birth year], 21 y.o.   MRN: [id number]  Patient Care Team: Dettinger, Fransisca Kaufmann, MD as PCP - General (Family Medicine)   Chief Complaint:  Annual Exam   HPI: Ruth King is a 21 y.o. female presenting on 10/13/2021 for Annual Exam   Pt presents today for annual physical exam with PAP. Works full time at [employer]. Denies smoking, drinking, or drug use. Not currently sexually active, has been in the past. Menstrual cycles normal, every 28 days. Has not had a PAP. Denies vaginal symptoms. Bowel and bladder habits normal. Does have some wound under arms and in groin which are bothersome. Is not up to date with vaccinations and declines all today. She is a diabetic and takes medications on a regular basis without associated side effects.    Relevant past medical, surgical, family, and social history reviewed and updated as indicated.  Allergies and medications reviewed and updated. Data reviewed: Chart in Epic.   Past Medical History:  Diagnosis Date   Asthma    Nausea & vomiting     Past Surgical History:  Procedure Laterality Date   ADENOIDECTOMY     APPENDECTOMY     CHOLECYSTECTOMY N/A 11/18/2019   Procedure: LAPAROSCOPIC CHOLECYSTECTOMY;  Surgeon: Virl Cagey, MD;  Location: [facility];  Service: General;  Laterality: N/A;   tubes in ears      Social History   Socioeconomic History   Marital status: Single    Spouse name: Not on file   Number of children: Not on file   Years of education: Not on file   Highest education level: Not on file  Occupational History   Not on file  Tobacco Use   Smoking status: Never   Smokeless tobacco: Never  Vaping Use   Vaping Use: Never used  Substance and Sexual Activity   Alcohol use: No   Drug use: No   Sexual activity: Not on file  Other Topics Concern   Not on file  Social History Narrative   Lives at home with mom, dad and two sisters, attends [employer]  is in the 10th grade.    Social Determinants of Health   Financial Resource Strain: Not on file  Food Insecurity: Not on file  Transportation Needs: Not on file  Physical Activity: Not on file  Stress: Not on file  Social Connections: Not on file  Intimate Partner Violence: Not on file    Outpatient Encounter Medications as of 10/13/2021  Medication Sig   albuterol (PROVENTIL HFA;VENTOLIN HFA) 108 (90 Base) MCG/ACT inhaler Inhale 2 puffs into the lungs every 6 (six) hours as needed for wheezing or shortness of breath.   cetirizine (ZYRTEC) 10 MG tablet TAKE 1 TABLET BY MOUTH AT BEDTIME   doxycycline (VIBRA-TABS) 100 MG tablet Take 1 tablet (100 mg total) by mouth 2 (two) times daily for 10 days. 1 po bid   DULoxetine (CYMBALTA) 30 MG capsule Take 1 capsule (30 mg total) by mouth at bedtime.   fluticasone (FLONASE) 50 MCG/ACT nasal spray Place 2 sprays into both nostrils daily. (Patient taking differently: Place 2 sprays into both nostrils daily as needed for allergies or rhinitis.)   LARIN FE 1.5/30 1.5-30 MG-MCG tablet TAKE 1 TABLET BY MOUTH DAILY. SKIP INACTIVE PILLS FOR TWO MONTHS,STARTING NEW PACK IMMEDIATELY, THEN TAKE FOR THIRD MONTH   metFORMIN (GLUCOPHAGE) 500 MG tablet Take 1 tablet (500 mg total) by  mouth daily with breakfast.   montelukast (SINGULAIR) 10 MG tablet TAKE 1 TABLET BY MOUTH AT BEDTIME   omeprazole (PRILOSEC) 20 MG capsule TAKE ONE CAPSULE BY MOUTH DAILY   ondansetron (ZOFRAN-ODT) 4 MG disintegrating tablet Take 1 tablet (4 mg total) by mouth every 6 (six) hours as needed for nausea.   rizatriptan (MAXALT) 10 MG tablet TAKE 1 TABLET BY MOUTH AS NEEDED. MAY REPEAT IN TWO HOURS IF NEEDED   topiramate (TOPAMAX) 50 MG tablet TAKE 1 TABLET BY MOUTH TWICE DAILY   Ubrogepant (UBRELVY) 50 MG TABS Take 50 mg by mouth as directed. Take at onset of headache, may repeat after 2 hours if headache still present. Max 2 tabs per day. (Patient taking differently: Take 50 mg by mouth  daily as needed (migraine). Take at onset of headache, may repeat after 2 hours if headache still present. Max 2 tabs per day.)   No facility-administered encounter medications on file as of 10/13/2021.    Allergies  Allergen Reactions   Bee Venom Shortness Of Breath   Penicillins Hives   Azithromycin Other (See Comments)    Not effective    Cephalosporins Hives    Review of Systems  Constitutional:  Negative for activity change, appetite change, chills, diaphoresis, fatigue, fever and unexpected weight change.  HENT: Negative.    Eyes: Negative.  Negative for photophobia and visual disturbance.  Respiratory:  Negative for cough, chest tightness and shortness of breath.   Cardiovascular:  Negative for chest pain, palpitations and leg swelling.  Gastrointestinal:  Negative for abdominal pain, blood in stool, constipation, diarrhea, nausea and vomiting.  Endocrine: Negative.  Negative for cold intolerance, heat intolerance, polydipsia, polyphagia and polyuria.  Genitourinary:  Negative for decreased urine volume, difficulty urinating, dysuria, frequency, urgency, vaginal bleeding, vaginal discharge and vaginal pain.  Musculoskeletal:  Negative for arthralgias, gait problem and myalgias.  Skin:  Positive for wound.  Allergic/Immunologic: Negative.   Neurological:  Negative for dizziness, tremors, seizures, syncope, facial asymmetry, speech difficulty, weakness, light-headedness, numbness and headaches.  Hematological: Negative.   Psychiatric/Behavioral:  Negative for confusion, hallucinations, sleep disturbance and suicidal ideas.   All other systems reviewed and are negative.      Objective:  BP 120/77    Pulse 93    Temp 99.1 F (37.3 C) (Oral)    Ht 6' (1.829 m)    Wt 286 lb (129.7 kg)    LMP 10/06/2021 (Approximate)    SpO2 96%    BMI 38.79 kg/m    Wt Readings from Last 3 Encounters:  10/13/21 286 lb (129.7 kg)  07/02/21 281 lb (127.5 kg)  06/02/21 285 lb (129.3 kg)     Physical Exam Vitals and nursing note reviewed.  Constitutional:      General: She is not in acute distress.    Appearance: Normal appearance. She is well-developed and well-groomed. She is morbidly obese. She is not ill-appearing, toxic-appearing or diaphoretic.  HENT:     Head: Normocephalic and atraumatic.     Jaw: There is normal jaw occlusion.     Right Ear: Hearing, tympanic membrane, ear canal and external ear normal.     Left Ear: Hearing, tympanic membrane, ear canal and external ear normal.     Nose: Nose normal.     Mouth/Throat:     Lips: Pink.     Mouth: Mucous membranes are moist.     Pharynx: Oropharynx is clear. Uvula midline.  Eyes:     General: Lids are normal.  Extraocular Movements: Extraocular movements intact.     Conjunctiva/sclera: Conjunctivae normal.     Pupils: Pupils are equal, round, and reactive to light.  Neck:     Thyroid: No thyroid mass, thyromegaly or thyroid tenderness.     Vascular: No carotid bruit or JVD.     Trachea: Trachea and phonation normal.  Cardiovascular:     Rate and Rhythm: Normal rate and regular rhythm.     Chest Wall: PMI is not displaced.     Pulses: Normal pulses.     Heart sounds: Normal heart sounds. No murmur heard.   No friction rub. No gallop.  Pulmonary:     Effort: Pulmonary effort is normal. No respiratory distress.     Breath sounds: Normal breath sounds. No wheezing.  Abdominal:     General: Bowel sounds are normal. There is no distension or abdominal bruit.     Palpations: Abdomen is soft. There is no hepatomegaly or splenomegaly.     Tenderness: There is no abdominal tenderness. There is no right CVA tenderness or left CVA tenderness.     Hernia: No hernia is present. There is no hernia in the left inguinal area or right inguinal area.  Genitourinary:    Exam position: Lithotomy position.     Pubic Area: No rash or pubic lice.      Tanner stage (genital): 5.     Labia:        Right: No tenderness,  lesion or injury.        Left: No rash, tenderness, lesion or injury.      Urethra: No prolapse, urethral pain, urethral swelling or urethral lesion.     Vagina: No signs of injury and foreign body. Vaginal discharge present. No erythema, tenderness, bleeding, lesions or prolapsed vaginal walls.     Cervix: Normal.     Uterus: Normal.      Adnexa: Right adnexa normal and left adnexa normal.  Musculoskeletal:        General: Normal range of motion.     Cervical back: Normal range of motion and neck supple.     Right lower leg: No edema.     Left lower leg: No edema.  Lymphadenopathy:     Cervical: No cervical adenopathy.     Lower Body: No right inguinal adenopathy. No left inguinal adenopathy.  Skin:    General: Skin is warm and dry.     Capillary Refill: Capillary refill takes less than 2 seconds.     Coloration: Skin is not cyanotic, jaundiced or pale.     Findings: Abscess, erythema and wound present. No rash.     Comments: Abscesses to bilateral axilla and groin. Right axilla abscess open and draining, other abscesses not fluctuant.   Neurological:     General: No focal deficit present.     Mental Status: She is alert and oriented to person, place, and time.     Sensory: Sensation is intact.     Motor: Motor function is intact.     Coordination: Coordination is intact.     Gait: Gait is intact.     Deep Tendon Reflexes: Reflexes are normal and symmetric.  Psychiatric:        Attention and Perception: Attention and perception normal.        Mood and Affect: Mood and affect normal.        Speech: Speech normal.        Behavior: Behavior normal. Behavior is cooperative.  Thought Content: Thought content normal.        Cognition and Memory: Cognition and memory normal.        Judgment: Judgment normal.    Results for orders placed or performed in visit on 06/02/21  CBC with Differential/Platelet  Result Value Ref Range   WBC 11.4 (H) 3.4 - 10.8 x10E3/uL   RBC 4.37  3.77 - 5.28 x10E6/uL   Hemoglobin 12.3 11.1 - 15.9 g/dL   Hematocrit 38.1 34.0 - 46.6 %   MCV 87 79 - 97 fL   MCH 28.1 26.6 - 33.0 pg   MCHC 32.3 31.5 - 35.7 g/dL   RDW 13.1 11.7 - 15.4 %   Platelets 370 150 - 450 x10E3/uL   Neutrophils 68 Not Estab. %   Lymphs 25 Not Estab. %   Monocytes 5 Not Estab. %   Eos 1 Not Estab. %   Basos 0 Not Estab. %   Neutrophils Absolute 7.9 (H) 1.4 - 7.0 x10E3/uL   Lymphocytes Absolute 2.8 0.7 - 3.1 x10E3/uL   Monocytes Absolute 0.5 0.1 - 0.9 x10E3/uL   EOS (ABSOLUTE) 0.1 0.0 - 0.4 x10E3/uL   Basophils Absolute 0.0 0.0 - 0.2 x10E3/uL   Immature Granulocytes 1 Not Estab. %   Immature Grans (Abs) 0.1 0.0 - 0.1 x10E3/uL  CMP14+EGFR  Result Value Ref Range   Glucose 256 (H) 65 - 99 mg/dL   BUN 9 6 - 20 mg/dL   Creatinine, Ser 0.54 (L) 0.57 - 1.00 mg/dL   eGFR 134 >59 mL/min/1.73   BUN/Creatinine Ratio 17 9 - 23   Sodium 139 134 - 144 mmol/L   Potassium 4.0 3.5 - 5.2 mmol/L   Chloride 100 96 - 106 mmol/L   CO2 24 20 - 29 mmol/L   Calcium 9.4 8.7 - 10.2 mg/dL   Total Protein 6.3 6.0 - 8.5 g/dL   Albumin 4.1 3.9 - 5.0 g/dL   Globulin, Total 2.2 1.5 - 4.5 g/dL   Albumin/Globulin Ratio 1.9 1.2 - 2.2   Bilirubin Total <0.2 0.0 - 1.2 mg/dL   Alkaline Phosphatase 111 44 - 121 IU/L   AST 19 0 - 40 IU/L   ALT 27 0 - 32 IU/L  Lipid panel  Result Value Ref Range   Cholesterol, Total 156 100 - 199 mg/dL   Triglycerides 222 (H) 0 - 149 mg/dL   HDL 29 (L) >39 mg/dL   VLDL Cholesterol Cal 38 5 - 40 mg/dL   LDL Chol Calc (NIH) 89 0 - 99 mg/dL   Chol/HDL Ratio 5.4 (H) 0.0 - 4.4 ratio  VITAMIN D 25 Hydroxy (Vit-D Deficiency, Fractures)  Result Value Ref Range   Vit D, 25-Hydroxy 12.0 (L) 30.0 - 100.0 ng/mL  Vitamin B12  Result Value Ref Range   Vitamin B-12 422 232 - 1,245 pg/mL  TSH  Result Value Ref Range   TSH 1.290 0.450 - 4.500 uIU/mL  Bayer DCA Hb A1c Waived  Result Value Ref Range   HB A1C (BAYER DCA - WAIVED) 6.9 <7.0 %       Pertinent  labs & imaging results that were available during my care of the patient were reviewed by me and considered in my medical decision making.  Assessment & Plan:  Willard was seen today for annual exam.  Diagnoses and all orders for this visit:  Annual physical exam Health maintenance discussed in detail. Declines all vaccinations.  -     Cytology - PAP -  CBC with Differential/Platelet -     CMP14+EGFR -     Lipid panel -     Thyroid Panel With TSH -     Microalbumin / creatinine urine ratio -     Urinalysis, Routine w reflex microscopic -     VITAMIN D 25 Hydroxy (Vit-D Deficiency, Fractures) -     Bayer DCA Hb A1c Waived -     HIV Antibody (routine testing w rflx) -     Hepatitis C antibody  Screening for cervical cancer -     Cytology - PAP  Screening for lipid disorders -     Lipid panel  Screening for endocrine disorder -     CMP14+EGFR -     Thyroid Panel With TSH -     Bayer DCA Hb A1c Waived  Routine screening for STI (sexually transmitted infection) -     Cytology - PAP -     HIV Antibody (routine testing w rflx) -     Hepatitis C antibody  Hydradenitis Symptomatic care discussed in detail. Will trail below. Pt aware to report any new, worsening, or persistent symptoms.  -     doxycycline (VIBRA-TABS) 100 MG tablet; Take 1 tablet (100 mg total) by mouth 2 (two) times daily for 10 days. 1 po bid  Vitamin D deficiency On repletion therapy, will recheck levels today.  -     VITAMIN D 25 Hydroxy (Vit-D Deficiency, Fractures)  Diabetes mellitus without complication ([place]) Labs pending, aware to see eye doctor. Will adjust medications if warranted.  -     CBC with Differential/Platelet -     CMP14+EGFR -     Lipid panel -     Thyroid Panel With TSH -     Microalbumin / creatinine urine ratio -     Bayer DCA Hb A1c Waived     Continue all other maintenance medications.  Follow up plan: Return in about 3 months (around 01/11/2022), or if symptoms worsen or  fail to improve, for PCP.   Continue healthy lifestyle choices, including diet (rich in fruits, vegetables, and lean proteins, and low in salt and simple carbohydrates) and exercise (at least 30 minutes of moderate physical activity daily).  Educational handout given for health maintenance   The above assessment and management plan was discussed with the patient. The patient verbalized understanding of and has agreed to the management plan. Patient is aware to call the clinic if they develop any new symptoms or if symptoms persist or worsen. Patient is aware when to return to the clinic for a follow-up visit. Patient educated on when it is appropriate to go to the emergency department.   Monia Pouch, FNP-C [facility] [phone]

## 2021-10-14 ENCOUNTER — Other Ambulatory Visit: Payer: Self-pay | Admitting: Family Medicine

## 2021-10-14 DIAGNOSIS — J309 Allergic rhinitis, unspecified: Secondary | ICD-10-CM

## 2021-10-14 DIAGNOSIS — G43009 Migraine without aura, not intractable, without status migrainosus: Secondary | ICD-10-CM

## 2021-10-14 DIAGNOSIS — G43809 Other migraine, not intractable, without status migrainosus: Secondary | ICD-10-CM

## 2021-10-14 LAB — CBC WITH DIFFERENTIAL/PLATELET
Basophils Absolute: 0 10*3/uL (ref 0.0–0.2)
Basos: 0 %
EOS (ABSOLUTE): 0.2 10*3/uL (ref 0.0–0.4)
Eos: 1 %
Hematocrit: 39.7 % (ref 34.0–46.6)
Hemoglobin: 13.3 g/dL (ref 11.1–15.9)
Immature Grans (Abs): 0.1 10*3/uL (ref 0.0–0.1)
Immature Granulocytes: 1 %
Lymphocytes Absolute: 2.9 10*3/uL (ref 0.7–3.1)
Lymphs: 23 %
MCH: 28.4 pg (ref 26.6–33.0)
MCHC: 33.5 g/dL (ref 31.5–35.7)
MCV: 85 fL (ref 79–97)
Monocytes Absolute: 0.5 10*3/uL (ref 0.1–0.9)
Monocytes: 4 %
Neutrophils Absolute: 8.9 10*3/uL — ABNORMAL HIGH (ref 1.4–7.0)
Neutrophils: 71 %
Platelets: 400 10*3/uL (ref 150–450)
RBC: 4.69 x10E6/uL (ref 3.77–5.28)
RDW: 12.9 % (ref 11.7–15.4)
WBC: 12.6 10*3/uL — ABNORMAL HIGH (ref 3.4–10.8)

## 2021-10-14 LAB — CMP14+EGFR
ALT: 37 IU/L — ABNORMAL HIGH (ref 0–32)
AST: 32 IU/L (ref 0–40)
Albumin/Globulin Ratio: 1.7 (ref 1.2–2.2)
Albumin: 4.3 g/dL (ref 3.9–5.0)
Alkaline Phosphatase: 125 IU/L — ABNORMAL HIGH (ref 44–121)
BUN/Creatinine Ratio: 19 (ref 9–23)
BUN: 10 mg/dL (ref 6–20)
Bilirubin Total: <0.2 mg/dL (ref 0.0–1.2)
CO2: 25 mmol/L (ref 20–29)
Calcium: 9.8 mg/dL (ref 8.7–10.2)
Chloride: 100 mmol/L (ref 96–106)
Creatinine, Ser: 0.54 mg/dL — ABNORMAL LOW (ref 0.57–1.00)
Globulin, Total: 2.6 g/dL (ref 1.5–4.5)
Glucose: 204 mg/dL — ABNORMAL HIGH (ref 70–99)
Potassium: 4.5 mmol/L (ref 3.5–5.2)
Sodium: 143 mmol/L (ref 134–144)
Total Protein: 6.9 g/dL (ref 6.0–8.5)
eGFR: 133 mL/min/{1.73_m2} (ref >59)

## 2021-10-14 LAB — LIPID PANEL
Chol/HDL Ratio: 5.9 ratio — ABNORMAL HIGH (ref 0.0–4.4)
Cholesterol, Total: 176 mg/dL (ref 100–199)
HDL: 30 mg/dL — ABNORMAL LOW (ref >39)
LDL Chol Calc (NIH): 109 mg/dL — ABNORMAL HIGH (ref 0–99)
Triglycerides: 212 mg/dL — ABNORMAL HIGH (ref 0–149)
VLDL Cholesterol Cal: 37 mg/dL (ref 5–40)

## 2021-10-14 LAB — HIV ANTIBODY (ROUTINE TESTING W REFLEX): HIV Screen 4th Generation wRfx: NONREACTIVE

## 2021-10-14 LAB — THYROID PANEL WITH TSH
Free Thyroxine Index: 1.8 (ref 1.2–4.9)
T3 Uptake Ratio: 23 % — ABNORMAL LOW (ref 24–39)
T4, Total: 7.7 ug/dL (ref 4.5–12.0)
TSH: 2.02 u[IU]/mL (ref 0.450–4.500)

## 2021-10-14 LAB — HEPATITIS C ANTIBODY: Hep C Virus Ab: <0.1 s/co ratio (ref 0.0–0.9)

## 2021-10-14 LAB — MICROALBUMIN / CREATININE URINE RATIO
Creatinine, Urine: 67 mg/dL
Microalb/Creat Ratio: 54 mg/g creat — ABNORMAL HIGH (ref 0–29)
Microalbumin, Urine: 36.3 ug/mL

## 2021-10-14 LAB — VITAMIN D 25 HYDROXY (VIT D DEFICIENCY, FRACTURES): Vit D, 25-Hydroxy: 24.3 ng/mL — ABNORMAL LOW (ref 30.0–100.0)

## 2021-10-19 LAB — CYTOLOGY - PAP
Adequacy: ABSENT
Chlamydia: NEGATIVE
Comment: NEGATIVE
Comment: NEGATIVE
Comment: NEGATIVE
Comment: NEGATIVE
Comment: NORMAL
Diagnosis: NEGATIVE
HSV1: NEGATIVE
HSV2: NEGATIVE
High risk HPV: NEGATIVE
Neisseria Gonorrhea: NEGATIVE
Trichomonas: NEGATIVE

## 2021-12-03 ENCOUNTER — Telehealth (INDEPENDENT_AMBULATORY_CARE_PROVIDER_SITE_OTHER): Payer: No Typology Code available for payment source | Admitting: Family Medicine

## 2021-12-03 ENCOUNTER — Encounter: Payer: Self-pay | Admitting: Family Medicine

## 2021-12-03 DIAGNOSIS — G43019 Migraine without aura, intractable, without status migrainosus: Secondary | ICD-10-CM

## 2021-12-03 DIAGNOSIS — R519 Headache, unspecified: Secondary | ICD-10-CM | POA: Diagnosis not present

## 2021-12-03 MED ORDER — FLUTICASONE PROPIONATE 50 MCG/ACT NA SUSP: 2.0000 | Freq: Every day | NASAL | 6 refills | Status: DC

## 2021-12-03 MED ORDER — PREDNISONE 20 MG PO TABS: ORAL_TABLET | ORAL | 0 refills | Status: DC

## 2021-12-03 NOTE — Progress Notes (Signed)
? ?Virtual Visit via mychart video Note ? ?I connected with Ruth King on 12/03/21 at 1618 by video and verified that I am speaking with the correct person using two identifiers. Ruth King is currently located at home and patient are currently with her during visit. The provider, Elige Radon Avid Guillette, MD is located in their office at time of visit. ? ?Call ended at 1629 ? ?I discussed the limitations, risks, security and privacy concerns of performing an evaluation and management service by video and the availability of in person appointments. I also discussed with the patient that there may be a patient responsible charge related to this service. The patient expressed understanding and agreed to proceed. She works at [employer] out.  ? ? ?History and Present Illness: ?Patient is calling in for 3 days of headache and congestion and body aches and coughing. She denies fevers but does have chills. She denies sick contacts. She denies shortness of breath in lungs.  She has sore throat as well. She started with throat and cough.  She tested negative for covid twice. She is having sensitivity to light.  She has tried halls and uses Excedrin and they are not helping. She took sinus medicine and it didn't help. ? ?1. 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 Sinus headache   ? ? ?Outpatient Encounter Medications as of 12/03/2021  ?Medication Sig  ? predniSONE (DELTASONE) 20 MG tablet 2 po at same time daily for 5 days  ? albuterol (PROVENTIL HFA;VENTOLIN HFA) 108 (90 Base) MCG/ACT inhaler Inhale 2 puffs into the lungs every 6 (six) hours as needed for wheezing or shortness of breath.  ? cetirizine (ZYRTEC) 10 MG tablet TAKE 1 TABLET BY MOUTH AT BEDTIME  ? DULoxetine (CYMBALTA) 30 MG capsule Take 1 capsule (30 mg total) by mouth at bedtime.  ? fluticasone (FLONASE) 50 MCG/ACT nasal spray Place 2 sprays into both nostrils daily.  ? LARIN FE 1.5/30 1.5-30 MG-MCG tablet TAKE 1 TABLET BY MOUTH  DAILY. SKIP INACTIVE PILLS FOR TWO MONTHS,STARTING NEW PACK IMMEDIATELY, THEN TAKE FOR THIRD MONTH  ? metFORMIN (GLUCOPHAGE) 500 MG tablet Take 1 tablet (500 mg total) by mouth daily with breakfast.  ? montelukast (SINGULAIR) 10 MG tablet TAKE 1 TABLET BY MOUTH AT BEDTIME  ? omeprazole (PRILOSEC) 20 MG capsule TAKE ONE CAPSULE BY MOUTH DAILY  ? ondansetron (ZOFRAN-ODT) 4 MG disintegrating tablet Take 1 tablet (4 mg total) by mouth every 6 (six) hours as needed for nausea.  ? rizatriptan (MAXALT) 10 MG tablet TAKE 1 TABLET BY MOUTH AS NEEDED. MAY REPEAT IN TWO HOURS IF NEEDED  ? topiramate (TOPAMAX) 50 MG tablet TAKE 1 TABLET BY MOUTH TWICE DAILY  ? Ubrogepant (UBRELVY) 50 MG TABS Take 50 mg by mouth as directed. Take at onset of headache, may repeat after 2 hours if headache still present. Max 2 tabs per day. (Patient taking differently: Take 50 mg by mouth daily as needed (migraine). Take at onset of headache, may repeat after 2 hours if headache still present. Max 2 tabs per day.)  ? [DISCONTINUED] fluticasone (FLONASE) 50 MCG/ACT nasal spray Place 2 sprays into both nostrils daily. (Patient taking differently: Place 2 sprays into both nostrils daily as needed for allergies or rhinitis.)  ? ?No facility-administered encounter medications on file as of 12/03/2021.  ? ? ?Review of Systems  ?Constitutional:  Negative for chills and fever.  ?HENT:  Positive for congestion, postnasal drip, rhinorrhea, sinus pressure, sneezing and  sore throat. Negative for ear discharge and ear pain.   ?Eyes:  Negative for pain, redness and visual disturbance.  ?Respiratory:  Positive for cough. Negative for chest tightness and shortness of breath.   ?Cardiovascular:  Negative for chest pain and leg swelling.  ?Genitourinary:  Negative for difficulty urinating and dysuria.  ?Musculoskeletal:  Negative for back pain and gait problem.  ?Skin:  Negative for rash.  ?Neurological:  Positive for headaches. Negative for dizziness and  light-headedness.  ?Psychiatric/Behavioral:  Negative for agitation and behavioral problems.   ?All other systems reviewed and are negative. ? ?Observations/Objective: ?Patient sounds and looks ill but comfortable ? ?Assessment and Plan: ?Problem List Items Addressed This Visit   ? ?  ? Cardiovascular and Mediastinum  ? Migraine headache - Primary  ? Relevant Medications  ? predniSONE (DELTASONE) 20 MG tablet  ? fluticasone (FLONASE) 50 MCG/ACT nasal spray  ? ?Other Visit Diagnoses   ? ? Sinus headache      ? Relevant Medications  ? predniSONE (DELTASONE) 20 MG tablet  ? fluticasone (FLONASE) 50 MCG/ACT nasal spray  ? ?  ?  ?Will treat like sinus pressure and migraines triggered by that.  The steroid should help with both and recommended Flonase as well and lots of fluids and rest and Mucinex.  Also recommended lots of hydration. ?Follow up plan: ?Return if symptoms worsen or fail to improve. ? ? ?  ?I discussed the assessment and treatment plan with the patient. The patient was provided an opportunity to ask questions and all were answered. The patient agreed with the plan and demonstrated an understanding of the instructions. ?  ?The patient was advised to call back or seek an in-person evaluation if the symptoms worsen or if the condition fails to improve as anticipated. ? ?The above assessment and management plan was discussed with the patient. The patient verbalized understanding of and has agreed to the management plan. Patient is aware to call the clinic if symptoms persist or worsen. Patient is aware when to return to the clinic for a follow-up visit. Patient educated on when it is appropriate to go to the emergency department.  ? ? ?I provided 11 minutes of non-face-to-face time during this encounter. ? ? ? ?Nils Pyle, MD ?   ?

## 2021-12-27 ENCOUNTER — Other Ambulatory Visit: Payer: Self-pay | Admitting: Family Medicine

## 2021-12-27 DIAGNOSIS — N915 Oligomenorrhea, unspecified: Secondary | ICD-10-CM

## 2022-01-02 ENCOUNTER — Other Ambulatory Visit: Payer: Self-pay | Admitting: Family Medicine

## 2022-01-02 DIAGNOSIS — G43019 Migraine without aura, intractable, without status migrainosus: Secondary | ICD-10-CM

## 2022-01-02 DIAGNOSIS — F339 Major depressive disorder, recurrent, unspecified: Secondary | ICD-10-CM

## 2022-03-08 ENCOUNTER — Ambulatory Visit (INDEPENDENT_AMBULATORY_CARE_PROVIDER_SITE_OTHER): Payer: Self-pay | Admitting: Family

## 2022-03-08 ENCOUNTER — Encounter: Payer: Self-pay | Admitting: Family

## 2022-03-08 VITALS — BP 126/76 | HR 88 | Temp 98.1°F | Ht 72.0 in | Wt 277.4 lb

## 2022-03-08 DIAGNOSIS — L732 Hidradenitis suppurativa: Secondary | ICD-10-CM

## 2022-03-08 MED ORDER — SULFAMETHOXAZOLE-TRIMETHOPRIM 800-160 MG PO TABS: 1.0000 | ORAL_TABLET | Freq: Two times a day (BID) | ORAL | 1 refills | Status: DC

## 2022-03-08 NOTE — Progress Notes (Signed)
   Subjective:    Patient ID: Ruth King, female    DOB: 19-Feb-2001, 21 y.o.   MRN: [id number]  Chief Complaint  Patient presents with   bump on breast area    HPI Pt presents to the office today with complaints of abscess on breast that comes and goes over the last year. She reports she gets the abscess in her axilla, breast, and groin. Reports that will rupture a purulent discharge and leaves scars.    Review of Systems  Skin:  Positive for wound.  All other systems reviewed and are negative.     Objective:   Physical Exam Vitals reviewed.  Constitutional:      General: She is not in acute distress.    Appearance: She is well-developed. She is obese.  HENT:     Head: Normocephalic and atraumatic.  Eyes:     Pupils: Pupils are equal, round, and reactive to light.  Neck:     Thyroid: No thyromegaly.  Cardiovascular:     Rate and Rhythm: Normal rate and regular rhythm.     Heart sounds: Normal heart sounds. No murmur heard. Pulmonary:     Effort: Pulmonary effort is normal. No respiratory distress.     Breath sounds: Normal breath sounds. No wheezing.  Abdominal:     General: Bowel sounds are normal. There is no distension.     Palpations: Abdomen is soft.     Tenderness: There is no abdominal tenderness.  Musculoskeletal:        General: No tenderness. Normal range of motion.     Cervical back: Normal range of motion and neck supple.  Skin:    General: Skin is warm and dry.     Findings: Abscess present.          Comments: Abscess with purulent discharge  Neurological:     Mental Status: She is alert and oriented to person, place, and time.     Cranial Nerves: No cranial nerve deficit.     Deep Tendon Reflexes: Reflexes are normal and symmetric.  Psychiatric:        Behavior: Behavior normal.        Thought Content: Thought content normal.        Judgment: Judgment normal.      BP 126/76   Pulse 88   Temp 98.1 F (36.7 C)   Ht 6' (1.829 m)   Wt  277 lb 6.4 oz (125.8 kg)   SpO2 99%   BMI 37.62 kg/m      Assessment & Plan:  Ruth King comes in today with chief complaint of bump on breast area   Diagnosis and orders addressed:  1. Hidradenitis suppurativa Use chlorhexidine in shower Warm compresses Keep clean and dry Bactrim today, refill given for next flare up Follow up with PCP - sulfamethoxazole-trimethoprim (BACTRIM DS) 800-160 MG tablet; Take 1 tablet by mouth 2 (two) times daily.  Dispense: 14 tablet; Refill: 1  [name], FNP

## 2022-03-08 NOTE — Patient Instructions (Signed)
Hidradenitis Suppurativa Hidradenitis suppurativa is a long-term (chronic) skin disease. It is similar to a severe form of acne, but it affects areas of the body where acne would be unusual, especially areas of the body where skin rubs against skin and becomes moist. These include: Underarms. Groin. Genital area. Buttocks. Upper thighs. Breasts. Hidradenitis suppurativa may start out as small lumps or pimples caused by blocked sweat glands or hair follicles. Pimples may develop into deep sores that break open (rupture) and drain pus. Over time, affected areas of skin may thicken and become scarred. This condition is rare and does not spread from person to person (non-contagious). What are the causes? The exact cause of this condition is not known. It may be related to: Female and female hormones. An overactive disease-fighting system (immune system). The immune system may over-react to blocked hair follicles or sweat glands and cause swelling and pus-filled sores. What increases the risk? You are more likely to develop this condition if you: Are female. Are 11-55 years old. Have a family history of hidradenitis suppurativa. Have a personal history of acne. Are overweight. Smoke. Take the medicine lithium. What are the signs or symptoms? The first symptoms are usually painful bumps in the skin, similar to pimples. The condition may get worse over time (progress), or it may only cause mild symptoms. If the disease progresses, symptoms may include: Skin bumps getting bigger and growing deeper into the skin. Bumps rupturing and draining pus. Itchy, infected skin. Skin getting thicker and scarred. Tunnels under the skin (fistulas) where pus drains from a bump. Pain during daily activities, such as pain during walking if your groin area is affected. Emotional problems, such as stress or depression. This condition may affect your appearance and your ability or willingness to wear certain clothes  or do certain activities. How is this diagnosed? This condition is diagnosed by a health care provider who specializes in skin diseases (dermatologist). You may be diagnosed based on: Your symptoms and medical history. A physical exam. Testing a pus sample for infection. Blood tests. How is this treated? Your treatment will depend on how severe your symptoms are. The same treatment will not work for everybody with this condition. You may need to try several treatments to find what works best for you. Treatment may include: Cleaning and bandaging (dressing) your wounds as needed. Lifestyle changes, such as new skin care routines. Taking medicines, such as: Antibiotics. Acne medicines. Medicines to reduce the activity of the immune system. A diabetes medicine (metformin). Birth control pills, for women. Steroids to reduce swelling and pain. Working with a mental health care provider, if you experience emotional distress due to this condition. If you have severe symptoms that do not get better with medicine, you may need surgery. Surgery may involve: Using a laser to clear the skin and remove hair follicles. Opening and draining deep sores. Removing the areas of skin that are diseased and scarred. Follow these instructions at home: Medicines  Take over-the-counter and prescription medicines only as told by your health care provider. If you were prescribed an antibiotic medicine, take it as told by your health care provider. Do not stop taking the antibiotic even if your condition improves. Skin care If you have open wounds, cover them with a clean dressing as told by your health care provider. Keep wounds clean by washing them gently with soap and water when you bathe. Do not shave the areas where you get hidradenitis suppurativa. Do not wear deodorant. Wear loose-fitting   clothes. Try to avoid getting overheated or sweaty. If you get sweaty or wet, change into clean, dry clothes as soon  as you can. To help relieve pain and itchiness, cover sore areas with a warm, clean washcloth (warm compress) for 5-10 minutes as often as needed. If told by your health care provider, take a bleach bath twice a week: Fill your bathtub halfway with water. Pour in  cup of unscented household bleach. Soak in the tub for 5-10 minutes. Only soak from the neck down. Avoid water on your face and hair. Shower to rinse off the bleach from your skin. General instructions Learn as much as you can about your disease so that you have an active role in your treatment. Work closely with your health care provider to find treatments that work for you. If you are overweight, work with your health care provider to lose weight as recommended. Do not use any products that contain nicotine or tobacco, such as cigarettes and e-cigarettes. If you need help quitting, ask your health care provider. If you struggle with living with this condition, talk with your health care provider or work with a mental health care provider as recommended. Keep all follow-up visits as told by your health care provider. This is important. Where to find more information Hidradenitis Suppurativa Foundation, Inc.: https://www.hs-foundation.org/ American Academy of Dermatology: https://www.aad.org Contact a health care provider if you have: A flare-up of hidradenitis suppurativa. A fever or chills. Trouble controlling your symptoms at home. Trouble doing your daily activities because of your symptoms. Trouble dealing with emotional problems related to your condition. Summary Hidradenitis suppurativa is a long-term (chronic) skin disease. It is similar to a severe form of acne, but it affects areas of the body where acne would be unusual. The first symptoms are usually painful bumps in the skin, similar to pimples. The condition may only cause mild symptoms, or it may get worse over time (progress). If you have open wounds, cover them  with a clean dressing as told by your health care provider. Keep wounds clean by washing them gently with soap and water when you bathe. Besides skin care, treatment may include medicines, laser treatment, and surgery. This information is not intended to replace advice given to you by your health care provider. Make sure you discuss any questions you have with your health care provider. Document Revised: 07/14/2020 Document Reviewed: 07/14/2020 Elsevier Patient Education  2023 Elsevier Inc.  

## 2022-03-16 ENCOUNTER — Other Ambulatory Visit: Payer: Self-pay | Admitting: Family

## 2022-03-16 DIAGNOSIS — L732 Hidradenitis suppurativa: Secondary | ICD-10-CM

## 2022-04-21 ENCOUNTER — Ambulatory Visit (INDEPENDENT_AMBULATORY_CARE_PROVIDER_SITE_OTHER): Payer: Self-pay | Admitting: Nurse Practitioner

## 2022-04-21 ENCOUNTER — Encounter: Payer: Self-pay | Admitting: Nurse Practitioner

## 2022-04-21 VITALS — BP 124/79 | HR 88 | Temp 97.7°F | Resp 20 | Ht 72.0 in | Wt 276.0 lb

## 2022-04-21 DIAGNOSIS — J029 Acute pharyngitis, unspecified: Secondary | ICD-10-CM

## 2022-04-21 LAB — RAPID STREP SCREEN (MED CTR MEBANE ONLY): Strep Gp A Ag, IA W/Reflex: NEGATIVE

## 2022-04-21 LAB — CULTURE, GROUP A STREP

## 2022-04-21 NOTE — Patient Instructions (Signed)

## 2022-04-21 NOTE — Progress Notes (Signed)
   Subjective:    Patient ID: Ruth King, female    DOB: 01-Sep-2001, 21 y.o.   MRN: [id number]   Chief Complaint: Sore Throat and Hoarse   Sore Throat  This is a new problem. The current episode started yesterday. The problem has been gradually worsening. Neither side of throat is experiencing more pain than the other. There has been no fever. The pain is at a severity of 8/10. The pain is moderate. Associated symptoms include congestion, coughing and trouble swallowing. Pertinent negatives include no ear pain, headaches or shortness of breath. She has tried acetaminophen for the symptoms. The treatment provided mild relief.       Review of Systems  HENT:  Positive for congestion and trouble swallowing. Negative for ear pain.   Respiratory:  Positive for cough. Negative for shortness of breath.   Neurological:  Negative for headaches.       Objective:   Physical Exam Constitutional:      Appearance: She is obese.  HENT:     Right Ear: Tympanic membrane normal.     Left Ear: Tympanic membrane normal.     Nose: Nose normal.     Mouth/Throat:     Mouth: Mucous membranes are moist.     Pharynx: No oropharyngeal exudate or posterior oropharyngeal erythema.  Eyes:     Conjunctiva/sclera: Conjunctivae normal.  Cardiovascular:     Rate and Rhythm: Normal rate and regular rhythm.  Pulmonary:     Effort: Pulmonary effort is normal.     Breath sounds: Normal breath sounds.  Skin:    General: Skin is warm.  Neurological:     General: No focal deficit present.     Mental Status: She is alert and oriented to person, place, and time.  Psychiatric:        Mood and Affect: Mood normal.        Behavior: Behavior normal.    BP 124/79   Pulse 88   Temp 97.7 F (36.5 C) (Temporal)   Resp 20   Ht 6' (1.829 m)   Wt 276 lb (125.2 kg)   SpO2 97%   BMI 37.43 kg/m   Strep negative       Assessment & Plan:   Ruth King in today with chief complaint of Sore Throat and  Hoarse   1. Sore throat - Rapid Strep Screen ([facility] ONLY)  2. Viral pharyngitis Force fluids Motrin or tylenol OTC OTC decongestant Throat lozenges if help New toothbrush in 3 days      The above assessment and management plan was discussed with the patient. The patient verbalized understanding of and has agreed to the management plan. Patient is aware to call the clinic if symptoms persist or worsen. Patient is aware when to return to the clinic for a follow-up visit. Patient educated on when it is appropriate to go to the emergency department.   Ruth Daphine Deutscher, FNP

## 2022-04-26 ENCOUNTER — Other Ambulatory Visit: Payer: Self-pay | Admitting: Family Medicine

## 2022-04-26 DIAGNOSIS — F339 Major depressive disorder, recurrent, unspecified: Secondary | ICD-10-CM

## 2022-04-26 DIAGNOSIS — G43019 Migraine without aura, intractable, without status migrainosus: Secondary | ICD-10-CM

## 2022-09-02 ENCOUNTER — Ambulatory Visit: Payer: Self-pay | Admitting: Family Medicine

## 2022-09-02 ENCOUNTER — Encounter: Payer: Self-pay | Admitting: Family Medicine

## 2022-09-02 VITALS — BP 137/91 | HR 84 | Temp 97.6°F | Ht 73.0 in | Wt 282.2 lb

## 2022-09-02 DIAGNOSIS — G43009 Migraine without aura, not intractable, without status migrainosus: Secondary | ICD-10-CM

## 2022-09-02 DIAGNOSIS — J020 Streptococcal pharyngitis: Secondary | ICD-10-CM

## 2022-09-02 MED ORDER — CLARITHROMYCIN 250 MG PO TABS: 250.0000 mg | ORAL_TABLET | Freq: Two times a day (BID) | ORAL | 0 refills | Status: AC

## 2022-09-02 NOTE — Progress Notes (Signed)
Subjective:  Patient ID: Ruth King, female    DOB: Sep 20, 2001, 21 y.o.   MRN: [id number]  Patient Care Team: Dettinger, Fransisca Kaufmann, MD as PCP - General (Family Medicine)   Chief Complaint:  Sore Throat and Headache (X 3 days)   HPI: Ruth King is a 21 y.o. female presenting on 09/02/2022 for Sore Throat and Headache (X 3 days)   Sore Throat  This is a new problem. Episode onset: 5 days ago. The problem has been gradually worsening. Neither side of throat is experiencing more pain than the other. The pain is at a severity of 7/10. The pain is moderate. Associated symptoms include headaches, a plugged ear sensation, swollen glands and trouble swallowing. Pertinent negatives include no abdominal pain, congestion, coughing, diarrhea, drooling, ear discharge, ear pain, hoarse voice, neck pain, shortness of breath, stridor or vomiting. She has tried acetaminophen for the symptoms. The treatment provided no relief.  Headache  This is a chronic problem. The problem occurs intermittently. The problem has been waxing and waning. The pain is located in the Parietal region. The pain does not radiate. The quality of the pain is described as aching, squeezing and throbbing. The pain is moderate. Associated symptoms include a fever, a sore throat and swollen glands. Pertinent negatives include no abdominal pain, abnormal behavior, anorexia, back pain, blurred vision, coughing, dizziness, ear pain, facial sweating, hearing loss, insomnia, loss of balance, muscle aches, nausea, neck pain, numbness, photophobia, rhinorrhea, scalp tenderness, seizures, sinus pressure, tingling, tinnitus, vomiting or weakness. The symptoms are aggravated by bright light and noise. She has tried nothing (out of migraine medications) for the symptoms. Her past medical history is significant for migraine headaches.     Relevant past medical, surgical, family, and social history reviewed and updated as indicated.   Allergies and medications reviewed and updated. Data reviewed: Chart in Epic.   Past Medical History:  Diagnosis Date   Asthma    Nausea & vomiting     Past Surgical History:  Procedure Laterality Date   ADENOIDECTOMY     APPENDECTOMY     CHOLECYSTECTOMY N/A 11/18/2019   Procedure: LAPAROSCOPIC CHOLECYSTECTOMY;  Surgeon: Virl Cagey, MD;  Location: [facility];  Service: General;  Laterality: N/A;   tubes in ears      Social History   Socioeconomic History   Marital status: Single    Spouse name: Not on file   Number of children: Not on file   Years of education: Not on file   Highest education level: Not on file  Occupational History   Not on file  Tobacco Use   Smoking status: Never   Smokeless tobacco: Never  Vaping Use   Vaping Use: Never used  Substance and Sexual Activity   Alcohol use: No   Drug use: No   Sexual activity: Not on file  Other Topics Concern   Not on file  Social History Narrative   Lives at home with mom, dad and two sisters, attends [employer] is in the 10th grade.    Social Determinants of Health   Financial Resource Strain: Not on file  Food Insecurity: Not on file  Transportation Needs: Not on file  Physical Activity: Not on file  Stress: Not on file  Social Connections: Not on file  Intimate Partner Violence: Not on file    Outpatient Encounter Medications as of 09/02/2022  Medication Sig   albuterol (PROVENTIL HFA;VENTOLIN HFA) 108 (90 Base) MCG/ACT inhaler Inhale  2 puffs into the lungs every 6 (six) hours as needed for wheezing or shortness of breath.   cetirizine (ZYRTEC) 10 MG tablet TAKE 1 TABLET BY MOUTH AT BEDTIME   clarithromycin (BIAXIN) 250 MG tablet Take 1 tablet (250 mg total) by mouth 2 (two) times daily for 10 days.   DULoxetine (CYMBALTA) 30 MG capsule TAKE ONE CAPSULE BY MOUTH AT BEDTIME   fluticasone (FLONASE) 50 MCG/ACT nasal spray Place 2 sprays into both nostrils daily.   LARIN FE 1.5/30 1.5-30 MG-MCG  tablet TAKE 1 TABLET BY MOUTH DAILY. SKIP INACTIVE PILLS FOR TWO MONTHS,STARTING NEW PACK IMMEDIATELY, THEN TAKE FOR THIRD MONTH   metFORMIN (GLUCOPHAGE) 500 MG tablet Take 1 tablet (500 mg total) by mouth daily with breakfast.   montelukast (SINGULAIR) 10 MG tablet TAKE 1 TABLET BY MOUTH AT BEDTIME   ondansetron (ZOFRAN-ODT) 4 MG disintegrating tablet Take 1 tablet (4 mg total) by mouth every 6 (six) hours as needed for nausea.   rizatriptan (MAXALT) 10 MG tablet TAKE 1 TABLET BY MOUTH AS NEEDED. MAY REPEAT IN TWO HOURS IF NEEDED   topiramate (TOPAMAX) 50 MG tablet TAKE 1 TABLET BY MOUTH TWICE DAILY   Ubrogepant (UBRELVY) 50 MG TABS Take 50 mg by mouth as directed. Take at onset of headache, may repeat after 2 hours if headache still present. Max 2 tabs per day. (Patient not taking: Reported on 09/02/2022)   No facility-administered encounter medications on file as of 09/02/2022.    Allergies  Allergen Reactions   Bee Venom Shortness Of Breath   Penicillins Hives   Azithromycin Other (See Comments)    Not effective    Cephalosporins Hives    Review of Systems  Constitutional:  Positive for chills, fatigue and fever. Negative for activity change, appetite change, diaphoresis and unexpected weight change.  HENT:  Positive for sore throat and trouble swallowing. Negative for congestion, dental problem, drooling, ear discharge, ear pain, facial swelling, hearing loss, hoarse voice, mouth sores, nosebleeds, postnasal drip, rhinorrhea, sinus pressure, sinus pain, sneezing, tinnitus and voice change.   Eyes: Negative.  Negative for blurred vision, photophobia and visual disturbance.  Respiratory:  Negative for cough, chest tightness, shortness of breath and stridor.   Cardiovascular:  Negative for chest pain, palpitations and leg swelling.  Gastrointestinal:  Negative for abdominal pain, anorexia, blood in stool, constipation, diarrhea, nausea and vomiting.  Endocrine: Negative.   Genitourinary:   Negative for decreased urine volume, difficulty urinating, dysuria, frequency and urgency.  Musculoskeletal:  Negative for arthralgias, back pain, myalgias and neck pain.  Skin: Negative.   Allergic/Immunologic: Negative.   Neurological:  Positive for headaches. Negative for dizziness, tingling, tremors, seizures, syncope, facial asymmetry, speech difficulty, weakness, light-headedness, numbness and loss of balance.  Hematological: Negative.   Psychiatric/Behavioral:  Negative for confusion, hallucinations, sleep disturbance and suicidal ideas. The patient does not have insomnia.   All other systems reviewed and are negative.       Objective:  BP (!) 137/91   Pulse 84   Temp 97.6 F (36.4 C) (Temporal)   Ht 6\' 1"  (1.854 m)   Wt 282 lb 3.2 oz (128 kg)   SpO2 98%   BMI 37.23 kg/m    Wt Readings from Last 3 Encounters:  09/02/22 282 lb 3.2 oz (128 kg)  04/21/22 276 lb (125.2 kg)  03/08/22 277 lb 6.4 oz (125.8 kg)    Physical Exam Vitals and nursing note reviewed.  Constitutional:      General: She is  not in acute distress.    Appearance: She is well-developed. She is obese. She is not ill-appearing, toxic-appearing or diaphoretic.  HENT:     Head: Normocephalic and atraumatic.     Right Ear: Ear canal normal. A middle ear effusion is present. Tympanic membrane is not erythematous.     Left Ear: Ear canal normal. A middle ear effusion is present. Tympanic membrane is not erythematous.     Nose: No congestion or rhinorrhea.     Mouth/Throat:     Mouth: Mucous membranes are moist. No oral lesions.     Pharynx: Oropharyngeal exudate and posterior oropharyngeal erythema present. No pharyngeal swelling or uvula swelling.     Tonsils: Tonsillar exudate present. 3+ on the right. 3+ on the left.  Eyes:     Conjunctiva/sclera: Conjunctivae normal.     Pupils: Pupils are equal, round, and reactive to light.  Cardiovascular:     Rate and Rhythm: Normal rate and regular rhythm.      Heart sounds: Normal heart sounds.  Pulmonary:     Effort: Pulmonary effort is normal.     Breath sounds: Normal breath sounds.  Abdominal:     General: Bowel sounds are normal.     Palpations: Abdomen is soft.  Musculoskeletal:     Cervical back: Neck supple.     Right lower leg: No edema.     Left lower leg: No edema.  Lymphadenopathy:     Cervical: Cervical adenopathy present.  Skin:    General: Skin is warm and dry.     Capillary Refill: Capillary refill takes less than 2 seconds.  Neurological:     General: No focal deficit present.     Mental Status: She is alert and oriented to person, place, and time.  Psychiatric:        Mood and Affect: Mood normal.        Behavior: Behavior normal.        Thought Content: Thought content normal.        Judgment: Judgment normal.     Results for orders placed or performed in visit on 04/21/22  Rapid Strep Screen ([facility] ONLY)   Specimen: Other   Other  Result Value Ref Range   Strep Gp A Ag, IA W/Reflex Negative Negative  Culture, Group A Strep   Other  Result Value Ref Range   Strep A Culture CANCELED        Pertinent labs & imaging results that were available during my care of the patient were reviewed by me and considered in my medical decision making.  Assessment & Plan:  Nekeshia was seen today for sore throat and headache.  Diagnoses and all orders for this visit:  Strep pharyngitis Classic strep - large erythematous tonsils with white/yellow exudate. Enlarged cervical lymp nodes, fever, and headaches. Will treat with below due to reported allergies. Report new, worsening, or persistent symptoms.  -     clarithromycin (BIAXIN) 250 MG tablet; Take 1 tablet (250 mg total) by mouth 2 (two) times daily for 10 days.  Migraine without aura and without status migrainosus, not intractable Pt reports she is out of her migraine medication and is not sure which one it is. She will call once home to let us know what she  is out of. Will give one time refill. Aware to follow up with PCP for further management.     Continue all other maintenance medications.  Follow up plan: Return if symptoms worsen  or fail to improve.   Continue healthy lifestyle choices, including diet (rich in fruits, vegetables, and lean proteins, and low in salt and simple carbohydrates) and exercise (at least 30 minutes of moderate physical activity daily).  Educational handout given for strep pharyngitis.   The above assessment and management plan was discussed with the patient. The patient verbalized understanding of and has agreed to the management plan. Patient is aware to call the clinic if they develop any new symptoms or if symptoms persist or worsen. Patient is aware when to return to the clinic for a follow-up visit. Patient educated on when it is appropriate to go to the emergency department.   Monia Pouch, FNP-C [facility] [phone]

## 2022-09-03 ENCOUNTER — Other Ambulatory Visit: Payer: Self-pay | Admitting: Family Medicine

## 2022-09-03 DIAGNOSIS — F339 Major depressive disorder, recurrent, unspecified: Secondary | ICD-10-CM

## 2022-09-03 DIAGNOSIS — G43019 Migraine without aura, intractable, without status migrainosus: Secondary | ICD-10-CM

## 2022-09-05 ENCOUNTER — Telehealth: Payer: Self-pay | Admitting: Family Medicine

## 2022-09-05 NOTE — Telephone Encounter (Signed)
Pt was seen on Friday and was prescribed an antibiotic for strep and ear pain. Says she has been taking the antibiotic but does not feel any better.   Can covering provider send in a different Rx for her to give her some relief?  Also says she needs work note extended because she can't go back to work feeling the way she feels.

## 2022-09-05 NOTE — Telephone Encounter (Signed)
Patient's mom is calling back to check on this. She said something needs to be called in for the inflammation that is behind her ears. Also wants to know when the letter will be ready to be picked up. Please call back before the end of day per mom.

## 2022-09-05 NOTE — Telephone Encounter (Signed)
Mom informed of Dettinger's recommendations. She thinks pt may be able to return to school this Thursday or Friday. Note will allow to return on Thursday. Mom will call back if needed. They will print letter from [employer].

## 2022-09-05 NOTE — Telephone Encounter (Signed)
Seen rakes 

## 2022-09-05 NOTE — Telephone Encounter (Signed)
We can extend the work note for her, but I would have her keep taking the antibiotic, that still is only slightly more than 2 days in her system so I would give it more time, that is a strong antibiotic.  She can also use Flonase and Mucinex to help with the symptoms

## 2022-10-27 ENCOUNTER — Encounter: Payer: Self-pay | Admitting: Family Medicine

## 2022-10-27 ENCOUNTER — Ambulatory Visit (INDEPENDENT_AMBULATORY_CARE_PROVIDER_SITE_OTHER): Payer: Medicaid Other | Admitting: Family Medicine

## 2022-10-27 VITALS — BP 136/86 | HR 103 | Ht 73.0 in | Wt 284.0 lb

## 2022-10-27 DIAGNOSIS — N915 Oligomenorrhea, unspecified: Secondary | ICD-10-CM

## 2022-10-27 DIAGNOSIS — G43009 Migraine without aura, not intractable, without status migrainosus: Secondary | ICD-10-CM

## 2022-10-27 DIAGNOSIS — F339 Major depressive disorder, recurrent, unspecified: Secondary | ICD-10-CM | POA: Diagnosis not present

## 2022-10-27 DIAGNOSIS — E119 Type 2 diabetes mellitus without complications: Secondary | ICD-10-CM | POA: Diagnosis not present

## 2022-10-27 DIAGNOSIS — E282 Polycystic ovarian syndrome: Secondary | ICD-10-CM

## 2022-10-27 DIAGNOSIS — G43809 Other migraine, not intractable, without status migrainosus: Secondary | ICD-10-CM

## 2022-10-27 DIAGNOSIS — G43019 Migraine without aura, intractable, without status migrainosus: Secondary | ICD-10-CM | POA: Diagnosis not present

## 2022-10-27 DIAGNOSIS — J309 Allergic rhinitis, unspecified: Secondary | ICD-10-CM

## 2022-10-27 LAB — BAYER DCA HB A1C WAIVED: HB A1C (BAYER DCA - WAIVED): 7.9 % — ABNORMAL HIGH (ref 4.8–5.6)

## 2022-10-27 LAB — LIPID PANEL

## 2022-10-27 MED ORDER — NORETHIN ACE-ETH ESTRAD-FE 1.5-30 MG-MCG PO TABS: ORAL_TABLET | ORAL | 3 refills | Status: DC

## 2022-10-27 MED ORDER — CETIRIZINE HCL 10 MG PO TABS: 10.0000 mg | ORAL_TABLET | Freq: Every day | ORAL | 3 refills | Status: DC

## 2022-10-27 MED ORDER — DULOXETINE HCL 30 MG PO CPEP: 30.0000 mg | ORAL_CAPSULE | Freq: Every day | ORAL | 3 refills | Status: DC

## 2022-10-27 MED ORDER — METFORMIN HCL 500 MG PO TABS: 500.0000 mg | ORAL_TABLET | Freq: Two times a day (BID) | ORAL | 3 refills | Status: DC

## 2022-10-27 MED ORDER — MONTELUKAST SODIUM 10 MG PO TABS: 10.0000 mg | ORAL_TABLET | Freq: Every day | ORAL | 3 refills | Status: DC

## 2022-10-27 MED ORDER — TOPIRAMATE 50 MG PO TABS: 50.0000 mg | ORAL_TABLET | Freq: Two times a day (BID) | ORAL | 3 refills | Status: DC

## 2022-10-27 NOTE — Progress Notes (Signed)
BP 136/86   Pulse (!) 103   Ht 6\' 1"  (1.854 m)   Wt 284 lb (128.8 kg)   SpO2 99%   BMI 37.47 kg/m    Subjective:   Patient ID: , female    DOB: 07-17-2001, 22 y.o.   MRN: [id number]  HPI: Ruth King is a 22 y.o. female presenting on 10/27/2022 for Medical Management of Chronic Issues and Diabetes   HPI Type 2 diabetes mellitus and PCOS Patient comes in today for recheck of his diabetes. Patient has been currently taking metformin. Patient is not currently on an ACE inhibitor/ARB. Patient has not seen an ophthalmologist this year. Patient denies any issues with their feet. The symptom started onset as an adult PCOS and depression ARE RELATED TO DM   Migraine headache.  Patient is coming in with migraine headache recheck.  She says they have been stable for the most part and she has been using the 10/29/2022 for the bad ones and the Maxalt for the not so bad ones and the Topamax for prevention.  Depression recheck Patient is coming in for depression and mood recheck.  She says she is feels like she is doing well.  She is currently taking the Cymbalta and feels like it does very well for her.  Birth control recheck Patient is taking birth control pills for menstrual cycle control and she feels like it does really well.  She has a cycle every 3 months by following the pattern.  Relevant past medical, surgical, family and social history reviewed and updated as indicated. Interim medical history since our last visit reviewed. Allergies and medications reviewed and updated.  Review of Systems  Constitutional:  Negative for chills and fever.  HENT:  Negative for congestion, ear discharge and ear pain.   Eyes:  Negative for redness and visual disturbance.  Respiratory:  Negative for chest tightness and shortness of breath.   Cardiovascular:  Negative for chest pain and leg swelling.  Genitourinary:  Negative for difficulty urinating and dysuria.  Musculoskeletal:   Negative for back pain and gait problem.  Skin:  Negative for rash.  Neurological:  Negative for light-headedness and headaches.  Psychiatric/Behavioral:  Negative for agitation and behavioral problems.   All other systems reviewed and are negative.   Per HPI unless specifically indicated above   Allergies as of 10/27/2022       Reactions   Bee Venom Shortness Of Breath   Penicillins Hives   Azithromycin Other (See Comments)   Not effective   Cephalosporins Hives        Medication List        Accurate as of October 27, 2022 11:25 AM. If you have any questions, ask your nurse or doctor.          albuterol 108 (90 Base) MCG/ACT inhaler Commonly known as: VENTOLIN HFA Inhale 2 puffs into the lungs every 6 (six) hours as needed for wheezing or shortness of breath.   cetirizine 10 MG tablet Commonly known as: ZYRTEC Take 1 tablet (10 mg total) by mouth at bedtime.   DULoxetine 30 MG capsule Commonly known as: CYMBALTA Take 1 capsule (30 mg total) by mouth at bedtime.   fluticasone 50 MCG/ACT nasal spray Commonly known as: FLONASE Place 2 sprays into both nostrils daily.   metFORMIN 500 MG tablet Commonly known as: GLUCOPHAGE Take 1 tablet (500 mg total) by mouth 2 (two) times daily with a meal. What changed: when to take this  Changed by: Fransisca Kaufmann Shevelle Smither, MD   montelukast 10 MG tablet Commonly known as: SINGULAIR Take 1 tablet (10 mg total) by mouth at bedtime.   norethindrone-ethinyl estradiol-iron 1.5-30 MG-MCG tablet Commonly known as: Larin Fe 1.5/30 TAKE 1 TABLET BY MOUTH DAILY. SKIP INACTIVE PILLS FOR TWO MONTHS,STARTING NEW PACK IMMEDIATELY, THEN TAKE FOR THIRD MONTH   ondansetron 4 MG disintegrating tablet Commonly known as: ZOFRAN-ODT Take 1 tablet (4 mg total) by mouth every 6 (six) hours as needed for nausea.   rizatriptan 10 MG tablet Commonly known as: MAXALT TAKE 1 TABLET BY MOUTH AS NEEDED. MAY REPEAT IN TWO HOURS IF NEEDED   topiramate  50 MG tablet Commonly known as: TOPAMAX Take 1 tablet (50 mg total) by mouth 2 (two) times daily.   Ubrelvy 50 MG Tabs Generic drug: Ubrogepant Take 50 mg by mouth as directed. Take at onset of headache, may repeat after 2 hours if headache still present. Max 2 tabs per day.         Objective:   BP 136/86   Pulse (!) 103   Ht 6\' 1"  (1.854 m)   Wt 284 lb (128.8 kg)   SpO2 99%   BMI 37.47 kg/m   Wt Readings from Last 3 Encounters:  10/27/22 284 lb (128.8 kg)  09/02/22 282 lb 3.2 oz (128 kg)  04/21/22 276 lb (125.2 kg)    Physical Exam Vitals and nursing note reviewed.  Constitutional:      General: She is not in acute distress.    Appearance: She is well-developed. She is not diaphoretic.  Eyes:     Conjunctiva/sclera: Conjunctivae normal.  Cardiovascular:     Rate and Rhythm: Normal rate and regular rhythm.     Heart sounds: Normal heart sounds. No murmur heard. Pulmonary:     Effort: Pulmonary effort is normal. No respiratory distress.     Breath sounds: Normal breath sounds. No wheezing.  Musculoskeletal:        General: No tenderness. Normal range of motion.  Skin:    General: Skin is warm and dry.     Findings: No rash.  Neurological:     Mental Status: She is alert and oriented to person, place, and time.     Coordination: Coordination normal.  Psychiatric:        Behavior: Behavior normal.       Assessment & Plan:   Problem List Items Addressed This Visit       Cardiovascular and Mediastinum   Migraine headache   Relevant Medications   DULoxetine (CYMBALTA) 30 MG capsule   topiramate (TOPAMAX) 50 MG tablet     Endocrine   Diabetes mellitus without complication (HCC) - Primary   Relevant Medications   metFORMIN (GLUCOPHAGE) 500 MG tablet   Other Relevant Orders   CBC with Differential/Platelet   CMP14+EGFR   Lipid panel   Bayer DCA Hb A1c Waived   Microalbumin / creatinine urine ratio   PCOS (polycystic ovarian syndrome)     Other    Depression, recurrent (HCC)   Relevant Medications   DULoxetine (CYMBALTA) 30 MG capsule   Other Visit Diagnoses     Oligomenorrhea, unspecified type       Relevant Medications   norethindrone-ethinyl estradiol-iron (LARIN FE 1.5/30) 1.5-30 MG-MCG tablet   Allergic rhinitis       Relevant Medications   montelukast (SINGULAIR) 10 MG tablet   cetirizine (ZYRTEC) 10 MG tablet       Patient's A1c is slightly  up at 7.9, likely with the holidays.  Will increase metformin to twice a day. Follow up plan: Return if symptoms worsen or fail to improve, for 3 to 4 months depression and diabetes.  Counseling provided for all of the vaccine components Orders Placed This Encounter  Procedures   CBC with Differential/Platelet   CMP14+EGFR   Lipid panel   Bayer DCA Hb A1c Waived   Microalbumin / creatinine urine ratio    Caryl Pina, MD [facility] 10/27/2022, 11:25 AM

## 2022-10-28 LAB — CBC WITH DIFFERENTIAL/PLATELET
Basophils Absolute: 0 10*3/uL (ref 0.0–0.2)
Basos: 0 %
EOS (ABSOLUTE): 0.1 10*3/uL (ref 0.0–0.4)
Eos: 1 %
Hematocrit: 36.8 % (ref 34.0–46.6)
Hemoglobin: 12 g/dL (ref 11.1–15.9)
Immature Grans (Abs): 0.1 10*3/uL (ref 0.0–0.1)
Immature Granulocytes: 1 %
Lymphocytes Absolute: 2.9 10*3/uL (ref 0.7–3.1)
Lymphs: 29 %
MCH: 27.6 pg (ref 26.6–33.0)
MCHC: 32.6 g/dL (ref 31.5–35.7)
MCV: 85 fL (ref 79–97)
Monocytes Absolute: 0.4 10*3/uL (ref 0.1–0.9)
Monocytes: 4 %
Neutrophils Absolute: 6.6 10*3/uL (ref 1.4–7.0)
Neutrophils: 65 %
Platelets: 366 10*3/uL (ref 150–450)
RBC: 4.34 x10E6/uL (ref 3.77–5.28)
RDW: 12.7 % (ref 11.7–15.4)
WBC: 10.1 10*3/uL (ref 3.4–10.8)

## 2022-10-28 LAB — CMP14+EGFR
ALT: 45 IU/L — ABNORMAL HIGH (ref 0–32)
AST: 30 IU/L (ref 0–40)
Albumin/Globulin Ratio: 1.8 (ref 1.2–2.2)
Albumin: 4.2 g/dL (ref 4.0–5.0)
Alkaline Phosphatase: 100 IU/L (ref 44–121)
BUN/Creatinine Ratio: 19 (ref 9–23)
BUN: 10 mg/dL (ref 6–20)
Bilirubin Total: <0.2 mg/dL (ref 0.0–1.2)
CO2: 21 mmol/L (ref 20–29)
Calcium: 9.2 mg/dL (ref 8.7–10.2)
Chloride: 104 mmol/L (ref 96–106)
Creatinine, Ser: 0.53 mg/dL — ABNORMAL LOW (ref 0.57–1.00)
Globulin, Total: 2.3 g/dL (ref 1.5–4.5)
Glucose: 231 mg/dL — ABNORMAL HIGH (ref 70–99)
Potassium: 4.3 mmol/L (ref 3.5–5.2)
Sodium: 141 mmol/L (ref 134–144)
Total Protein: 6.5 g/dL (ref 6.0–8.5)
eGFR: 133 mL/min/{1.73_m2} (ref >59)

## 2022-10-28 LAB — LIPID PANEL
Chol/HDL Ratio: 4.8 ratio — ABNORMAL HIGH (ref 0.0–4.4)
Cholesterol, Total: 144 mg/dL (ref 100–199)
HDL: 30 mg/dL — ABNORMAL LOW (ref >39)
LDL Chol Calc (NIH): 92 mg/dL (ref 0–99)
Triglycerides: 118 mg/dL (ref 0–149)
VLDL Cholesterol Cal: 22 mg/dL (ref 5–40)

## 2022-10-29 LAB — MICROALBUMIN / CREATININE URINE RATIO
Creatinine, Urine: 152.6 mg/dL
Microalb/Creat Ratio: 49 mg/g creat — ABNORMAL HIGH (ref 0–29)
Microalbumin, Urine: 74.7 ug/mL

## 2022-12-20 ENCOUNTER — Ambulatory Visit: Payer: Medicaid Other | Admitting: Family Medicine

## 2022-12-20 ENCOUNTER — Encounter: Payer: Self-pay | Admitting: Family Medicine

## 2022-12-20 VITALS — BP 117/78 | HR 72 | Temp 97.5°F | Ht 73.0 in | Wt 283.6 lb

## 2022-12-20 DIAGNOSIS — L249 Irritant contact dermatitis, unspecified cause: Secondary | ICD-10-CM

## 2022-12-20 MED ORDER — HYDROXYZINE PAMOATE 25 MG PO CAPS: 25.0000 mg | ORAL_CAPSULE | Freq: Three times a day (TID) | ORAL | 0 refills | Status: AC | PRN

## 2022-12-20 MED ORDER — FAMOTIDINE 20 MG PO TABS: 20.0000 mg | ORAL_TABLET | Freq: Two times a day (BID) | ORAL | 0 refills | Status: AC

## 2022-12-20 NOTE — Patient Instructions (Signed)
-  Continue prednisone 

## 2022-12-20 NOTE — Progress Notes (Signed)
Subjective:  Patient ID: Ruth King, female    DOB: 12-Jul-2001, 22 y.o.   MRN: [id number]  Patient Care Team: Dettinger, Fransisca Kaufmann, MD as PCP - General (Family Medicine)   Chief Complaint:  Rash (Patient went urgent care yesterday - next care )   HPI: Ruth King is a 22 y.o. female presenting on 12/20/2022 for Rash (Patient went urgent care yesterday - next care )   Pruritic rash for several days, was seen at [facility] yesterday and placed on prednisone.   Rash This is a new problem. The current episode started 1 to 4 weeks ago. The problem has been gradually improving since onset. The rash is diffuse. The rash is characterized by itchiness, redness and scaling. She was exposed to a new detergent/soap. Pertinent negatives include no anorexia, congestion, cough, diarrhea, eye pain, facial edema, fatigue, fever, joint pain, nail changes, rhinorrhea, shortness of breath, sore throat or vomiting. Past treatments include oral steroids. The treatment provided mild relief.    Relevant past medical, surgical, family, and social history reviewed and updated as indicated.  Allergies and medications reviewed and updated. Data reviewed: Chart in Epic.   Past Medical History:  Diagnosis Date   Asthma    Nausea & vomiting     Past Surgical History:  Procedure Laterality Date   ADENOIDECTOMY     APPENDECTOMY     CHOLECYSTECTOMY N/A 11/18/2019   Procedure: LAPAROSCOPIC CHOLECYSTECTOMY;  Surgeon: Virl Cagey, MD;  Location: [facility];  Service: General;  Laterality: N/A;   tubes in ears      Social History   Socioeconomic History   Marital status: Single    Spouse name: Not on file   Number of children: Not on file   Years of education: Not on file   Highest education level: Not on file  Occupational History   Not on file  Tobacco Use   Smoking status: Never   Smokeless tobacco: Never  Vaping Use   Vaping Use: Never used  Substance and Sexual Activity   Alcohol use: No    Drug use: No   Sexual activity: Not on file  Other Topics Concern   Not on file  Social History Narrative   Lives at home with mom, dad and two sisters, attends [employer] is in the 10th grade.    Social Determinants of Health   Financial Resource Strain: Not on file  Food Insecurity: Not on file  Transportation Needs: Not on file  Physical Activity: Not on file  Stress: Not on file  Social Connections: Not on file  Intimate Partner Violence: Not on file    Outpatient Encounter Medications as of 12/20/2022  Medication Sig   albuterol (PROVENTIL HFA;VENTOLIN HFA) 108 (90 Base) MCG/ACT inhaler Inhale 2 puffs into the lungs every 6 (six) hours as needed for wheezing or shortness of breath.   cetirizine (ZYRTEC) 10 MG tablet Take 1 tablet (10 mg total) by mouth at bedtime.   DULoxetine (CYMBALTA) 30 MG capsule Take 1 capsule (30 mg total) by mouth at bedtime.   famotidine (PEPCID) 20 MG tablet Take 1 tablet (20 mg total) by mouth 2 (two) times daily for 14 days.   fluticasone (FLONASE) 50 MCG/ACT nasal spray Place 2 sprays into both nostrils daily.   hydrOXYzine (VISTARIL) 25 MG capsule Take 1 capsule (25 mg total) by mouth every 8 (eight) hours as needed.   metFORMIN (GLUCOPHAGE) 500 MG tablet Take 1 tablet (500 mg total)  by mouth 2 (two) times daily with a meal.   montelukast (SINGULAIR) 10 MG tablet Take 1 tablet (10 mg total) by mouth at bedtime.   norethindrone-ethinyl estradiol-iron (LARIN FE 1.5/30) 1.5-30 MG-MCG tablet TAKE 1 TABLET BY MOUTH DAILY. SKIP INACTIVE PILLS FOR TWO MONTHS,STARTING NEW PACK IMMEDIATELY, THEN TAKE FOR THIRD MONTH   ondansetron (ZOFRAN-ODT) 4 MG disintegrating tablet Take 1 tablet (4 mg total) by mouth every 6 (six) hours as needed for nausea.   predniSONE (DELTASONE) 20 MG tablet Take 20 mg by mouth 2 (two) times daily.   rizatriptan (MAXALT) 10 MG tablet TAKE 1 TABLET BY MOUTH AS NEEDED. MAY REPEAT IN TWO HOURS IF NEEDED   topiramate (TOPAMAX) 50 MG  tablet Take 1 tablet (50 mg total) by mouth 2 (two) times daily.   Ubrogepant (UBRELVY) 50 MG TABS Take 50 mg by mouth as directed. Take at onset of headache, may repeat after 2 hours if headache still present. Max 2 tabs per day.   No facility-administered encounter medications on file as of 12/20/2022.    Allergies  Allergen Reactions   Bee Venom Shortness Of Breath   Penicillins Hives   Azithromycin Other (See Comments)    Not effective    Cephalosporins Hives    Review of Systems  Constitutional:  Negative for activity change, appetite change, chills, diaphoresis, fatigue, fever and unexpected weight change.  HENT:  Negative for congestion, rhinorrhea, sore throat, trouble swallowing and voice change.   Eyes:  Negative for photophobia, pain and visual disturbance.  Respiratory:  Negative for apnea, cough, choking, chest tightness, shortness of breath and stridor.   Cardiovascular:  Negative for chest pain, palpitations and leg swelling.  Gastrointestinal:  Negative for abdominal pain, anorexia, diarrhea, nausea and vomiting.  Endocrine: Negative for cold intolerance, heat intolerance, polydipsia, polyphagia and polyuria.  Genitourinary:  Negative for decreased urine volume and difficulty urinating.  Musculoskeletal:  Negative for joint pain.  Skin:  Positive for color change and rash. Negative for nail changes, pallor and wound.  Neurological:  Negative for dizziness, tremors, seizures, syncope, facial asymmetry, speech difficulty, weakness, light-headedness, numbness and headaches.  Psychiatric/Behavioral:  Negative for confusion.   All other systems reviewed and are negative.       Objective:  BP 117/78   Pulse 72   Temp (!) 97.5 F (36.4 C) (Temporal)   Ht 6\' 1"  (1.854 m)   Wt 283 lb 9.6 oz (128.6 kg)   BMI 37.42 kg/m    Wt Readings from Last 3 Encounters:  12/20/22 283 lb 9.6 oz (128.6 kg)  10/27/22 284 lb (128.8 kg)  09/02/22 282 lb 3.2 oz (128 kg)     Physical Exam Vitals and nursing note reviewed.  Constitutional:      General: She is not in acute distress.    Appearance: Normal appearance. She is obese. She is not ill-appearing, toxic-appearing or diaphoretic.  HENT:     Mouth/Throat:     Mouth: Mucous membranes are moist.  Eyes:     Pupils: Pupils are equal, round, and reactive to light.  Cardiovascular:     Rate and Rhythm: Normal rate and regular rhythm.     Heart sounds: Normal heart sounds.  Pulmonary:     Effort: Pulmonary effort is normal.     Breath sounds: Normal breath sounds.  Skin:    General: Skin is warm and dry.     Capillary Refill: Capillary refill takes less than 2 seconds.     Findings:  Erythema and rash present. Rash is macular and papular.     Comments: Scattered rash to arms, torso, and neck  Neurological:     General: No focal deficit present.     Mental Status: She is alert and oriented to person, place, and time.  Psychiatric:        Mood and Affect: Mood normal.        Behavior: Behavior normal.        Thought Content: Thought content normal.        Judgment: Judgment normal.     Results for orders placed or performed in visit on 10/27/22  CBC with Differential/Platelet  Result Value Ref Range   WBC 10.1 3.4 - 10.8 x10E3/uL   RBC 4.34 3.77 - 5.28 x10E6/uL   Hemoglobin 12.0 11.1 - 15.9 g/dL   Hematocrit 36.8 34.0 - 46.6 %   MCV 85 79 - 97 fL   MCH 27.6 26.6 - 33.0 pg   MCHC 32.6 31.5 - 35.7 g/dL   RDW 12.7 11.7 - 15.4 %   Platelets 366 150 - 450 x10E3/uL   Neutrophils 65 Not Estab. %   Lymphs 29 Not Estab. %   Monocytes 4 Not Estab. %   Eos 1 Not Estab. %   Basos 0 Not Estab. %   Neutrophils Absolute 6.6 1.4 - 7.0 x10E3/uL   Lymphocytes Absolute 2.9 0.7 - 3.1 x10E3/uL   Monocytes Absolute 0.4 0.1 - 0.9 x10E3/uL   EOS (ABSOLUTE) 0.1 0.0 - 0.4 x10E3/uL   Basophils Absolute 0.0 0.0 - 0.2 x10E3/uL   Immature Granulocytes 1 Not Estab. %   Immature Grans (Abs) 0.1 0.0 - 0.1 x10E3/uL   CMP14+EGFR  Result Value Ref Range   Glucose 231 (H) 70 - 99 mg/dL   BUN 10 6 - 20 mg/dL   Creatinine, Ser 0.53 (L) 0.57 - 1.00 mg/dL   eGFR 133 >59 mL/min/1.73   BUN/Creatinine Ratio 19 9 - 23   Sodium 141 134 - 144 mmol/L   Potassium 4.3 3.5 - 5.2 mmol/L   Chloride 104 96 - 106 mmol/L   CO2 21 20 - 29 mmol/L   Calcium 9.2 8.7 - 10.2 mg/dL   Total Protein 6.5 6.0 - 8.5 g/dL   Albumin 4.2 4.0 - 5.0 g/dL   Globulin, Total 2.3 1.5 - 4.5 g/dL   Albumin/Globulin Ratio 1.8 1.2 - 2.2   Bilirubin Total <0.2 0.0 - 1.2 mg/dL   Alkaline Phosphatase 100 44 - 121 IU/L   AST 30 0 - 40 IU/L   ALT 45 (H) 0 - 32 IU/L  Lipid panel  Result Value Ref Range   Cholesterol, Total 144 100 - 199 mg/dL   Triglycerides 118 0 - 149 mg/dL   HDL 30 (L) >39 mg/dL   VLDL Cholesterol Cal 22 5 - 40 mg/dL   LDL Chol Calc (NIH) 92 0 - 99 mg/dL   Chol/HDL Ratio 4.8 (H) 0.0 - 4.4 ratio  Bayer DCA Hb A1c Waived  Result Value Ref Range   HB A1C (BAYER DCA - WAIVED) 7.9 (H) 4.8 - 5.6 %  Microalbumin / creatinine urine ratio  Result Value Ref Range   Creatinine, Urine 152.6 Not Estab. mg/dL   Microalbumin, Urine 74.7 Not Estab. ug/mL   Microalb/Creat Ratio 49 (H) 0 - 29 mg/g creat       Pertinent labs & imaging results that were available during my care of the patient were reviewed by me and considered in my medical  decision making.  Assessment & Plan:  Takeysha was seen today for rash.  Diagnoses and all orders for this visit:  Irritant contact dermatitis, unspecified trigger Likely from detergent change. Continue prednisone. Will add below to help with reaction. No red flags concerning for anaphylaxis. Report new, worsening, or persistent symptoms.  -     famotidine (PEPCID) 20 MG tablet; Take 1 tablet (20 mg total) by mouth 2 (two) times daily for 14 days. -     hydrOXYzine (VISTARIL) 25 MG capsule; Take 1 capsule (25 mg total) by mouth every 8 (eight) hours as needed.     Continue all other  maintenance medications.  Follow up plan: Return if symptoms worsen or fail to improve.   Continue healthy lifestyle choices, including diet (rich in fruits, vegetables, and lean proteins, and low in salt and simple carbohydrates) and exercise (at least 30 minutes of moderate physical activity daily).  Educational handout given for contact dermatitis   The above assessment and management plan was discussed with the patient. The patient verbalized understanding of and has agreed to the management plan. Patient is aware to call the clinic if they develop any new symptoms or if symptoms persist or worsen. Patient is aware when to return to the clinic for a follow-up visit. Patient educated on when it is appropriate to go to the emergency department.   Monia Pouch, FNP-C [facility] [phone]

## 2023-01-12 ENCOUNTER — Emergency Department (HOSPITAL_COMMUNITY)
Admission: EM | Admit: 2023-01-12 | Discharge: 2023-01-12 | Disposition: A | Discharge status: Home / Self Care | Payer: Medicaid Other | Attending: Student | Admitting: Student

## 2023-01-12 ENCOUNTER — Encounter (HOSPITAL_COMMUNITY): Payer: Self-pay

## 2023-01-12 ENCOUNTER — Other Ambulatory Visit: Payer: Self-pay

## 2023-01-12 DIAGNOSIS — Z20822 Contact with and (suspected) exposure to covid-19: Secondary | ICD-10-CM | POA: Insufficient documentation

## 2023-01-12 DIAGNOSIS — D72829 Elevated white blood cell count, unspecified: Secondary | ICD-10-CM | POA: Diagnosis not present

## 2023-01-12 DIAGNOSIS — R112 Nausea with vomiting, unspecified: Secondary | ICD-10-CM | POA: Diagnosis present

## 2023-01-12 DIAGNOSIS — K529 Noninfective gastroenteritis and colitis, unspecified: Secondary | ICD-10-CM | POA: Diagnosis not present

## 2023-01-12 DIAGNOSIS — Z7984 Long term (current) use of oral hypoglycemic drugs: Secondary | ICD-10-CM | POA: Diagnosis not present

## 2023-01-12 DIAGNOSIS — R1084 Generalized abdominal pain: Secondary | ICD-10-CM

## 2023-01-12 LAB — RESP PANEL BY RT-PCR (RSV, FLU A&B, COVID)  RVPGX2
Influenza A by PCR: NEGATIVE
Influenza B by PCR: NEGATIVE
Resp Syncytial Virus by PCR: NEGATIVE
SARS Coronavirus 2 by RT PCR: NEGATIVE

## 2023-01-12 LAB — URINALYSIS, ROUTINE W REFLEX MICROSCOPIC
Bacteria, UA: NONE SEEN
Bilirubin Urine: NEGATIVE
Glucose, UA: NEGATIVE mg/dL
Hgb urine dipstick: NEGATIVE
Ketones, ur: NEGATIVE mg/dL
Nitrite: NEGATIVE
Protein, ur: 100 mg/dL — AB
Specific Gravity, Urine: 1.024 (ref 1.005–1.030)
pH: 5 (ref 5.0–8.0)

## 2023-01-12 LAB — COMPREHENSIVE METABOLIC PANEL
ALT: 41 U/L (ref 0–44)
AST: 25 U/L (ref 15–41)
Albumin: 4.3 g/dL (ref 3.5–5.0)
Alkaline Phosphatase: 81 U/L (ref 38–126)
Anion gap: 8 (ref 5–15)
BUN: 14 mg/dL (ref 6–20)
CO2: 21 mmol/L — ABNORMAL LOW (ref 22–32)
Calcium: 8.9 mg/dL (ref 8.9–10.3)
Chloride: 104 mmol/L (ref 98–111)
Creatinine, Ser: 0.44 mg/dL (ref 0.44–1.00)
GFR, Estimated: >60 mL/min (ref >60)
Glucose, Bld: 146 mg/dL — ABNORMAL HIGH (ref 70–99)
Potassium: 4.2 mmol/L (ref 3.5–5.1)
Sodium: 133 mmol/L — ABNORMAL LOW (ref 135–145)
Total Bilirubin: 0.3 mg/dL (ref 0.3–1.2)
Total Protein: 7.4 g/dL (ref 6.5–8.1)

## 2023-01-12 LAB — CBC WITH DIFFERENTIAL/PLATELET
Abs Immature Granulocytes: 0.07 10*3/uL (ref 0.00–0.07)
Basophils Absolute: 0 10*3/uL (ref 0.0–0.1)
Basophils Relative: 0 %
Eosinophils Absolute: 0.1 10*3/uL (ref 0.0–0.5)
Eosinophils Relative: 1 %
HCT: 40.8 % (ref 36.0–46.0)
Hemoglobin: 13.3 g/dL (ref 12.0–15.0)
Immature Granulocytes: 1 %
Lymphocytes Relative: 14 %
Lymphs Abs: 1.7 10*3/uL (ref 0.7–4.0)
MCH: 28.3 pg (ref 26.0–34.0)
MCHC: 32.6 g/dL (ref 30.0–36.0)
MCV: 86.8 fL (ref 80.0–100.0)
Monocytes Absolute: 0.6 10*3/uL (ref 0.1–1.0)
Monocytes Relative: 4 %
Neutro Abs: 10.1 10*3/uL — ABNORMAL HIGH (ref 1.7–7.7)
Neutrophils Relative %: 80 %
Platelets: 364 10*3/uL (ref 150–400)
RBC: 4.7 MIL/uL (ref 3.87–5.11)
RDW: 13.7 % (ref 11.5–15.5)
WBC: 12.5 10*3/uL — ABNORMAL HIGH (ref 4.0–10.5)
nRBC: 0 % (ref 0.0–0.2)

## 2023-01-12 LAB — PREGNANCY, URINE: Preg Test, Ur: NEGATIVE

## 2023-01-12 MED ORDER — ONDANSETRON 4 MG PO TBDP: 4.0000 mg | ORAL_TABLET | Freq: Three times a day (TID) | ORAL | 0 refills | Status: DC | PRN

## 2023-01-12 MED ORDER — ONDANSETRON HCL 4 MG/2ML IJ SOLN
4.0000 mg | Freq: Once | INTRAMUSCULAR | Status: AC
  Administered 2023-01-12: 4 mg via INTRAVENOUS
  Filled 2023-01-12: qty 2

## 2023-01-12 MED ORDER — KETOROLAC TROMETHAMINE 15 MG/ML IJ SOLN
15.0000 mg | Freq: Once | INTRAMUSCULAR | Status: AC
  Administered 2023-01-12: 15 mg via INTRAVENOUS
  Filled 2023-01-12: qty 1

## 2023-01-12 MED ORDER — SODIUM CHLORIDE 0.9 % IV BOLUS
1000.0000 mL | Freq: Once | INTRAVENOUS | Status: AC
  Administered 2023-01-12: 1000 mL via INTRAVENOUS

## 2023-01-12 NOTE — ED Triage Notes (Signed)
Pt came in today c/o vomiting and diarrhea since this morning, abd hurting in the middle.

## 2023-01-12 NOTE — ED Provider Notes (Signed)
[place] EMERGENCY DEPARTMENT AT [facility] Provider Note   CSN: [id number] Arrival date & time: 01/12/23  1119     History  Chief Complaint  Patient presents with   Abdominal Pain   Nausea   Emesis    Ruth King is a 22 y.o. female, history of intellectual disability, who presents to the ED secondary to nausea, vomiting, diarrhea, and abdominal pain has been going on for the last 12 hours.  Sister at bedside provides most of history given patient's of intellectual disability. Denies any abnormal food products or sick contacts.     Home Medications Prior to Admission medications   Medication Sig Start Date End Date Taking? Authorizing Provider  ondansetron (ZOFRAN-ODT) 4 MG disintegrating tablet Take 1 tablet (4 mg total) by mouth every 8 (eight) hours as needed for nausea or vomiting. 01/12/23  Yes Simranjit Thayer L, PA  albuterol (PROVENTIL HFA;VENTOLIN HFA) 108 (90 Base) MCG/ACT inhaler Inhale 2 puffs into the lungs every 6 (six) hours as needed for wheezing or shortness of breath. 10/08/18   [name], Carol L, MD  cetirizine (ZYRTEC) 10 MG tablet Take 1 tablet (10 mg total) by mouth at bedtime. 10/27/22   Dettinger, Elige RadonJoshua A, MD  DULoxetine (CYMBALTA) 30 MG capsule Take 1 capsule (30 mg total) by mouth at bedtime. 10/27/22   Dettinger, Elige RadonJoshua A, MD  famotidine (PEPCID) 20 MG tablet Take 1 tablet (20 mg total) by mouth 2 (two) times daily for 14 days. 12/20/22 01/03/23  Sonny Mastersakes, Linda M, FNP  fluticasone (FLONASE) 50 MCG/ACT nasal spray Place 2 sprays into both nostrils daily. 12/03/21   Dettinger, Elige RadonJoshua A, MD  hydrOXYzine (VISTARIL) 25 MG capsule Take 1 capsule (25 mg total) by mouth every 8 (eight) hours as needed. 12/20/22   Sonny Mastersakes, Linda M, FNP  metFORMIN (GLUCOPHAGE) 500 MG tablet Take 1 tablet (500 mg total) by mouth 2 (two) times daily with a meal. 10/27/22   Dettinger, Elige RadonJoshua A, MD  montelukast (SINGULAIR) 10 MG tablet Take 1 tablet (10 mg total) by mouth at bedtime.  10/27/22   Dettinger, Elige RadonJoshua A, MD  norethindrone-ethinyl estradiol-iron (LARIN FE 1.5/30) 1.5-30 MG-MCG tablet TAKE 1 TABLET BY MOUTH DAILY. SKIP INACTIVE PILLS FOR TWO MONTHS,STARTING NEW PACK IMMEDIATELY, THEN TAKE FOR THIRD MONTH 10/27/22   Dettinger, Elige RadonJoshua A, MD  predniSONE (DELTASONE) 20 MG tablet Take 20 mg by mouth 2 (two) times daily. 12/19/22   [provider]  rizatriptan (MAXALT) 10 MG tablet TAKE 1 TABLET BY MOUTH AS NEEDED. MAY REPEAT IN TWO HOURS IF NEEDED 10/14/21   Dettinger, Elige RadonJoshua A, MD  topiramate (TOPAMAX) 50 MG tablet Take 1 tablet (50 mg total) by mouth 2 (two) times daily. 10/27/22   Dettinger, Elige RadonJoshua A, MD  Ubrogepant (UBRELVY) 50 MG TABS Take 50 mg by mouth as directed. Take at onset of headache, may repeat after 2 hours if headache still present. Max 2 tabs per day. 08/05/19   Dettinger, Elige RadonJoshua A, MD      Allergies    Bee venom, Penicillins, Azithromycin, and Cephalosporins    Review of Systems   Review of Systems  Constitutional:  Negative for fever.  Gastrointestinal:  Positive for abdominal pain, diarrhea, nausea and vomiting.    Physical Exam Updated Vital Signs BP (!) 146/96 (BP Location: Right Arm)   Pulse 90   Temp 97.7 F (36.5 C) (Oral)   Resp 16   LMP 01/02/2023 (Approximate)   SpO2 98%  Physical Exam Vitals and nursing  note reviewed.  Constitutional:      General: She is not in acute distress.    Appearance: She is well-developed.  HENT:     Head: Normocephalic and atraumatic.  Eyes:     Conjunctiva/sclera: Conjunctivae normal.  Cardiovascular:     Rate and Rhythm: Normal rate and regular rhythm.     Heart sounds: No murmur heard. Pulmonary:     Effort: Pulmonary effort is normal. No respiratory distress.     Breath sounds: Normal breath sounds.  Abdominal:     Palpations: Abdomen is soft.     Tenderness: There is generalized abdominal tenderness. There is no guarding or rebound.  Musculoskeletal:        General: No swelling.      Cervical back: Neck supple.  Skin:    General: Skin is warm and dry.     Capillary Refill: Capillary refill takes less than 2 seconds.  Neurological:     Mental Status: She is alert.  Psychiatric:        Mood and Affect: Mood normal.     ED Results / Procedures / Treatments   Labs (all labs ordered are listed, but only abnormal results are displayed) Labs Reviewed  COMPREHENSIVE METABOLIC PANEL - Abnormal; Notable for the following components:      Result Value   Sodium 133 (*)    CO2 21 (*)    Glucose, Bld 146 (*)    All other components within normal limits  CBC WITH DIFFERENTIAL/PLATELET - Abnormal; Notable for the following components:   WBC 12.5 (*)    Neutro Abs 10.1 (*)    All other components within normal limits  URINALYSIS, ROUTINE W REFLEX MICROSCOPIC - Abnormal; Notable for the following components:   APPearance HAZY (*)    Protein, ur 100 (*)    Leukocytes,Ua MODERATE (*)    All other components within normal limits  RESP PANEL BY RT-PCR (RSV, FLU A&B, COVID)  RVPGX2  PREGNANCY, URINE    EKG None  Radiology No results found.  Procedures Procedures    Medications Ordered in ED Medications  ondansetron (ZOFRAN) injection 4 mg (4 mg Intravenous Given 01/12/23 1245)  sodium chloride 0.9 % bolus 1,000 mL (1,000 mLs Intravenous New Bag/Given 01/12/23 1244)  ketorolac (TORADOL) 15 MG/ML injection 15 mg (15 mg Intravenous Given 01/12/23 1245)    ED Course/ Medical Decision Making/ A&P                             Medical Decision Making She is 22 year old female, here for abdominal pain, nausea, vomiting, diarrhea that began this morning.  States that hurts in the middle of her belly.  On exam she does have tenderness to palpation without guarding.  Will obtain labs for further evaluation.  Given her nausea, vomiting, diarrhea and suspicious for probable gastroenteritis.  Amount and/or Complexity of Data Reviewed Labs: ordered.    Details: Leukocytosis of  12 K Discussion of management or test interpretation with external provider(s): Discussed with patient and caretaker/sister at bedside.  Feeling better after medications able to tolerate p.o. intake.  Labs are reassuring except for mild leukocytosis, I believe this is likely secondary to vomiting, reactivity.  She was able to tolerate p.o., given her combination nausea, vomiting, diarrhea and generalized abdominal pain, I believe this is secondary to gastroenteritis.  We discussed return precautions and I sent her Zofran to the pharmacy.  She is overall well-appearing.  Risk Prescription drug management.    Final Clinical Impression(s) / ED Diagnoses Final diagnoses:  Gastroenteritis  Generalized abdominal pain    Rx / DC Orders ED Discharge Orders          Ordered    ondansetron (ZOFRAN-ODT) 4 MG disintegrating tablet  Every 8 hours PRN        01/12/23 1516              Peregrine Nolt Elbert Ewings, PA 01/12/23 1519    Glendora Score, MD 01/12/23 1702

## 2023-01-12 NOTE — Discharge Instructions (Addendum)
Please follow-up with your primary care doctor, make sure you are drinking lots of fluids, and resting.  You can use Zofran for your nausea, and follow the diet as instructed, to help relieve your diarrhea.  Return to the ER if you have worsening abdominal pain, intractable nausea or vomiting.

## 2023-01-13 ENCOUNTER — Telehealth: Payer: Self-pay

## 2023-01-13 NOTE — Transitions of Care (Post Inpatient/ED Visit) (Unsigned)
   01/13/2023  Name: Ruth King MRN: [id number] DOB: 05-30-2001  Today's TOC FU Call Status: Today's TOC FU Call Status:: Unsuccessul Call (1st Attempt) Unsuccessful Call (1st Attempt) Date: 01/13/23  Attempted to reach the patient regarding the most recent Inpatient/ED visit.  Follow Up Plan: Additional outreach attempts will be made to reach the patient to complete the Transitions of Care (Post Inpatient/ED visit) call.   Signature Karena Addison, LPN [facility] Nurse Health Advisor Direct Dial [phone]

## 2023-01-17 NOTE — Transitions of Care (Post Inpatient/ED Visit) (Signed)
   01/17/2023  Name: Ruth King MRN: [id number] DOB: June 01, 2001  Today's TOC FU Call Status: Today's TOC FU Call Status:: Unsuccessful Call (2nd Attempt) Unsuccessful Call (1st Attempt) Date: 01/13/23 Unsuccessful Call (2nd Attempt) Date: 01/17/23  Attempted to reach the patient regarding the most recent Inpatient/ED visit.  Follow Up Plan: No further outreach attempts will be made at this time. We have been unable to contact the patient.  Signature Karena Addison, LPN [facility] Nurse Health Advisor Direct Dial [phone]

## 2023-01-27 ENCOUNTER — Ambulatory Visit: Payer: Medicaid Other | Admitting: Family Medicine

## 2023-01-27 ENCOUNTER — Encounter: Payer: Self-pay | Admitting: Family Medicine

## 2023-01-27 VITALS — BP 126/79 | HR 77 | Temp 98.7°F | Ht 73.0 in | Wt 282.0 lb

## 2023-01-27 DIAGNOSIS — E1169 Type 2 diabetes mellitus with other specified complication: Secondary | ICD-10-CM

## 2023-01-27 DIAGNOSIS — N915 Oligomenorrhea, unspecified: Secondary | ICD-10-CM

## 2023-01-27 DIAGNOSIS — E119 Type 2 diabetes mellitus without complications: Secondary | ICD-10-CM | POA: Diagnosis not present

## 2023-01-27 LAB — BAYER DCA HB A1C WAIVED: HB A1C (BAYER DCA - WAIVED): 7.5 % — ABNORMAL HIGH (ref 4.8–5.6)

## 2023-01-27 MED ORDER — SEMAGLUTIDE(0.25 OR 0.5MG/DOS) 2 MG/3ML ~~LOC~~ SOPN
0.5000 mg | PEN_INJECTOR | SUBCUTANEOUS | 3 refills | Status: DC
Start: 2023-01-27 — End: 2023-08-01

## 2023-01-27 MED ORDER — NORETHIN ACE-ETH ESTRAD-FE 1.5-30 MG-MCG PO TABS
ORAL_TABLET | ORAL | 3 refills | Status: DC
Start: 2023-01-27 — End: 2024-01-31

## 2023-01-27 NOTE — Progress Notes (Signed)
BP 126/79   Pulse 77   Temp 98.7 F (37.1 C)   Ht 6\' 1"  (1.854 m)   Wt 282 lb (127.9 kg)   LMP 01/02/2023 (Approximate)   SpO2 97%   BMI 37.21 kg/m    Subjective:   Patient ID: Ruth King, female    DOB: [birth year], 22 y.o.   MRN: [id number]  HPI: Ruth King is a 22 y.o. female presenting on 01/27/2023 for Medical Management of Chronic Issues (3 month)   HPI Type 2 diabetes mellitus Patient comes in today for recheck of his diabetes. Patient has been currently taking metformin. Patient is not currently on an ACE inhibitor/ARB. Patient has not seen an ophthalmologist this year. Patient denies any new issues with their feet. The symptom started onset as an adult polycystic ovarian syndrome ARE RELATED TO DM   Birth control refill and menstrual irregularities Patient currently takes birth control to help with PCOS and menstrual irregularities.  She feels like it goes well for her.  Anxiety depression recheck Currently taking Cymbalta and feels like her anxiety is under control and she is pretty happy with the Cymbalta and feels like it does well for her.  Denies side effects or suicidal ideations.    01/27/2023    1:09 PM 10/27/2022   10:56 AM 09/02/2022   10:38 AM 04/21/2022   12:36 PM 03/08/2022    2:49 PM  Depression screen PHQ 2/9  Decreased Interest 0 0 0 0 0  Down, Depressed, Hopeless 0 0 0 0 0  PHQ - 2 Score 0 0 0 0 0  Altered sleeping 0 0 0 0   Tired, decreased energy 0 0 0 0   Change in appetite 0 0 0 0   Feeling bad or failure about yourself  0   0   Trouble concentrating 0 0 0 0   Moving slowly or fidgety/restless 0 0 0 0   Suicidal thoughts 0 0 0 0   PHQ-9 Score 0 0 0 0   Difficult doing work/chores Not difficult at all Not difficult at all Not difficult at all Not difficult at all      Relevant past medical, surgical, family and social history reviewed and updated as indicated. Interim medical history since our last visit reviewed. Allergies and  medications reviewed and updated.  Review of Systems  Constitutional:  Negative for chills and fever.  Eyes:  Negative for visual disturbance.  Respiratory:  Negative for chest tightness and shortness of breath.   Cardiovascular:  Negative for chest pain and leg swelling.  Gastrointestinal:  Negative for abdominal pain.  Genitourinary:  Negative for difficulty urinating and dysuria.  Musculoskeletal:  Negative for back pain and gait problem.  Skin:  Negative for rash.  Neurological:  Negative for dizziness, light-headedness and headaches.  Psychiatric/Behavioral:  Negative for agitation and behavioral problems.   All other systems reviewed and are negative.   Per HPI unless specifically indicated above   Allergies as of 01/27/2023       Reactions   Bee Venom Shortness Of Breath   Penicillins Hives   Azithromycin Other (See Comments)   Not effective   Cephalosporins Hives        Medication List        Accurate as of January 27, 2023  2:07 PM. If you have any questions, ask your nurse or doctor.          STOP taking these medications    predniSONE 20  MG tablet Commonly known as: DELTASONE Stopped by: Elige Radon Jamelah Sitzer, MD       TAKE these medications    albuterol 108 (90 Base) MCG/ACT inhaler Commonly known as: VENTOLIN HFA Inhale 2 puffs into the lungs every 6 (six) hours as needed for wheezing or shortness of breath.   cetirizine 10 MG tablet Commonly known as: ZYRTEC Take 1 tablet (10 mg total) by mouth at bedtime.   DULoxetine 30 MG capsule Commonly known as: CYMBALTA Take 1 capsule (30 mg total) by mouth at bedtime.   famotidine 20 MG tablet Commonly known as: Pepcid Take 1 tablet (20 mg total) by mouth 2 (two) times daily for 14 days.   fluticasone 50 MCG/ACT nasal spray Commonly known as: FLONASE Place 2 sprays into both nostrils daily.   hydrOXYzine 25 MG capsule Commonly known as: VISTARIL Take 1 capsule (25 mg total) by mouth every 8  (eight) hours as needed.   metFORMIN 500 MG tablet Commonly known as: GLUCOPHAGE Take 1 tablet (500 mg total) by mouth 2 (two) times daily with a meal.   montelukast 10 MG tablet Commonly known as: SINGULAIR Take 1 tablet (10 mg total) by mouth at bedtime.   norethindrone-ethinyl estradiol-iron 1.5-30 MG-MCG tablet Commonly known as: Larin Fe 1.5/30 TAKE 1 TABLET BY MOUTH DAILY. SKIP INACTIVE PILLS FOR TWO MONTHS,STARTING NEW PACK IMMEDIATELY, THEN TAKE FOR THIRD MONTH   ondansetron 4 MG disintegrating tablet Commonly known as: ZOFRAN-ODT Take 1 tablet (4 mg total) by mouth every 8 (eight) hours as needed for nausea or vomiting.   rizatriptan 10 MG tablet Commonly known as: MAXALT TAKE 1 TABLET BY MOUTH AS NEEDED. MAY REPEAT IN TWO HOURS IF NEEDED   Semaglutide(0.25 or 0.5MG /DOS) 2 MG/3ML Sopn Inject 0.5 mg into the skin once a week. Started by: Nils Pyle, MD   topiramate 50 MG tablet Commonly known as: TOPAMAX Take 1 tablet (50 mg total) by mouth 2 (two) times daily.   Ubrelvy 50 MG Tabs Generic drug: Ubrogepant Take 50 mg by mouth as directed. Take at onset of headache, may repeat after 2 hours if headache still present. Max 2 tabs per day.         Objective:   BP 126/79   Pulse 77   Temp 98.7 F (37.1 C)   Ht 6\' 1"  (1.854 m)   Wt 282 lb (127.9 kg)   LMP 01/02/2023 (Approximate)   SpO2 97%   BMI 37.21 kg/m   Wt Readings from Last 3 Encounters:  01/27/23 282 lb (127.9 kg)  12/20/22 283 lb 9.6 oz (128.6 kg)  10/27/22 284 lb (128.8 kg)    Physical Exam Vitals and nursing note reviewed.  Constitutional:      General: She is not in acute distress.    Appearance: She is well-developed. She is not diaphoretic.  Eyes:     Conjunctiva/sclera: Conjunctivae normal.  Cardiovascular:     Rate and Rhythm: Normal rate and regular rhythm.     Heart sounds: Normal heart sounds. No murmur heard. Pulmonary:     Effort: Pulmonary effort is normal. No  respiratory distress.     Breath sounds: Normal breath sounds. No wheezing.  Musculoskeletal:        General: No swelling or tenderness. Normal range of motion.  Skin:    General: Skin is warm and dry.     Findings: No rash.  Neurological:     Mental Status: She is alert and oriented to person, place, and  time.     Coordination: Coordination normal.  Psychiatric:        Behavior: Behavior normal.       Assessment & Plan:   Problem List Items Addressed This Visit       Endocrine   Type 2 diabetes mellitus with other specified complication (HCC) - Primary   Relevant Medications   Semaglutide,0.25 or 0.5MG /DOS, 2 MG/3ML SOPN   Other Visit Diagnoses     Oligomenorrhea, unspecified type       Relevant Medications   norethindrone-ethinyl estradiol-iron (LARIN FE 1.5/30) 1.5-30 MG-MCG tablet     Patient is on metformin and diabetes is not controlled still, 7.5 A1c and with her young age, will try her on a trial of Ozempic.  Gave sample of Ozempic and have her start 0.25 mg weekly and then after the first month go up to 0.5 mg weekly  Follow up plan: Return in about 3 months (around 04/28/2023), or if symptoms worsen or fail to improve, for Diabetes recheck.  Counseling provided for all of the vaccine components Orders Placed This Encounter  Procedures   Bayer DCA Hb A1c Waived    Arville Care, MD [facility] 01/27/2023, 2:07 PM

## 2023-01-28 ENCOUNTER — Encounter: Payer: Self-pay | Admitting: Family Medicine

## 2023-01-30 ENCOUNTER — Telehealth: Payer: Self-pay

## 2023-01-30 MED ORDER — FREESTYLE LIBRE 3 SENSOR MISC: 1.0000 | 5 refills | Status: DC

## 2023-01-30 MED ORDER — FREESTYLE LIBRE 3 READER DEVI: 1.0000 | Freq: Two times a day (BID) | 0 refills | Status: DC

## 2023-01-30 NOTE — Telephone Encounter (Signed)
Ruth King ([name]) Rx #: [id number] Ozempic (0.25 or 0.5 MG/DOSE) 2MG /3ML pen-injectors Form [employer] Electronic Prior Authorization Form (2017 NCPDP) Created 3 days ago Sent to Plan 6 minutes ago Plan Response 5 minutes ago Submit Clinical Questions less than a minute ago Determination Wait for Determination Please wait for [employer] 2017 NCPDP to return a determination.

## 2023-02-08 ENCOUNTER — Emergency Department (HOSPITAL_COMMUNITY)
Admission: EM | Admit: 2023-02-08 | Discharge: 2023-02-08 | Disposition: A | Discharge status: Home / Self Care | Payer: Medicaid Other | Attending: Emergency Medicine | Admitting: Emergency Medicine

## 2023-02-08 ENCOUNTER — Other Ambulatory Visit: Payer: Self-pay

## 2023-02-08 ENCOUNTER — Encounter (HOSPITAL_COMMUNITY): Payer: Self-pay | Admitting: Emergency Medicine

## 2023-02-08 DIAGNOSIS — N3001 Acute cystitis with hematuria: Secondary | ICD-10-CM | POA: Insufficient documentation

## 2023-02-08 DIAGNOSIS — E119 Type 2 diabetes mellitus without complications: Secondary | ICD-10-CM | POA: Diagnosis not present

## 2023-02-08 DIAGNOSIS — Z7984 Long term (current) use of oral hypoglycemic drugs: Secondary | ICD-10-CM | POA: Diagnosis not present

## 2023-02-08 DIAGNOSIS — N390 Urinary tract infection, site not specified: Secondary | ICD-10-CM

## 2023-02-08 DIAGNOSIS — R112 Nausea with vomiting, unspecified: Secondary | ICD-10-CM | POA: Diagnosis present

## 2023-02-08 LAB — COMPREHENSIVE METABOLIC PANEL
ALT: 24 U/L (ref 0–44)
AST: 14 U/L — ABNORMAL LOW (ref 15–41)
Albumin: 3.9 g/dL (ref 3.5–5.0)
Alkaline Phosphatase: 78 U/L (ref 38–126)
Anion gap: 10 (ref 5–15)
BUN: 11 mg/dL (ref 6–20)
CO2: 23 mmol/L (ref 22–32)
Calcium: 9.2 mg/dL (ref 8.9–10.3)
Chloride: 102 mmol/L (ref 98–111)
Creatinine, Ser: 0.61 mg/dL (ref 0.44–1.00)
GFR, Estimated: >60 mL/min (ref >60)
Glucose, Bld: 133 mg/dL — ABNORMAL HIGH (ref 70–99)
Potassium: 4 mmol/L (ref 3.5–5.1)
Sodium: 135 mmol/L (ref 135–145)
Total Bilirubin: 0.6 mg/dL (ref 0.3–1.2)
Total Protein: 7.5 g/dL (ref 6.5–8.1)

## 2023-02-08 LAB — URINALYSIS, ROUTINE W REFLEX MICROSCOPIC
Bilirubin Urine: NEGATIVE
Glucose, UA: NEGATIVE mg/dL
Ketones, ur: NEGATIVE mg/dL
Nitrite: NEGATIVE
Protein, ur: 100 mg/dL — AB
Specific Gravity, Urine: 1.027 (ref 1.005–1.030)
pH: 5 (ref 5.0–8.0)

## 2023-02-08 LAB — CBC
HCT: 41.5 % (ref 36.0–46.0)
Hemoglobin: 13.3 g/dL (ref 12.0–15.0)
MCH: 28 pg (ref 26.0–34.0)
MCHC: 32 g/dL (ref 30.0–36.0)
MCV: 87.4 fL (ref 80.0–100.0)
Platelets: 441 10*3/uL — ABNORMAL HIGH (ref 150–400)
RBC: 4.75 MIL/uL (ref 3.87–5.11)
RDW: 13.6 % (ref 11.5–15.5)
WBC: 15.1 10*3/uL — ABNORMAL HIGH (ref 4.0–10.5)
nRBC: 0 % (ref 0.0–0.2)

## 2023-02-08 LAB — LIPASE, BLOOD: Lipase: 28 U/L (ref 11–51)

## 2023-02-08 LAB — PREGNANCY, URINE: Preg Test, Ur: NEGATIVE

## 2023-02-08 MED ORDER — ONDANSETRON HCL 4 MG/2ML IJ SOLN
4.0000 mg | Freq: Once | INTRAMUSCULAR | Status: AC
  Administered 2023-02-08: 4 mg via INTRAVENOUS
  Filled 2023-02-08: qty 2

## 2023-02-08 MED ORDER — ONDANSETRON HCL 4 MG PO TABS: 4.0000 mg | ORAL_TABLET | Freq: Four times a day (QID) | ORAL | 0 refills | Status: DC

## 2023-02-08 MED ORDER — NITROFURANTOIN MACROCRYSTAL 100 MG PO CAPS
100.0000 mg | ORAL_CAPSULE | Freq: Once | ORAL | Status: AC
  Administered 2023-02-08: 100 mg via ORAL
  Filled 2023-02-08: qty 1

## 2023-02-08 MED ORDER — SODIUM CHLORIDE 0.9 % IV BOLUS
1000.0000 mL | Freq: Once | INTRAVENOUS | Status: AC
  Administered 2023-02-08: 1000 mL via INTRAVENOUS

## 2023-02-08 MED ORDER — ONDANSETRON 8 MG PO TBDP
8.0000 mg | ORAL_TABLET | Freq: Once | ORAL | Status: AC
  Administered 2023-02-08: 8 mg via ORAL
  Filled 2023-02-08: qty 1

## 2023-02-08 MED ORDER — NITROFURANTOIN MONOHYD MACRO 100 MG PO CAPS: 100.0000 mg | ORAL_CAPSULE | Freq: Two times a day (BID) | ORAL | 0 refills | Status: DC

## 2023-02-08 NOTE — ED Provider Notes (Signed)
[place] EMERGENCY DEPARTMENT AT [facility] Provider Note   CSN: [id number] Arrival date & time: 02/08/23  1131     History  Chief Complaint  Patient presents with   Abdominal Pain   HPI Ruth King is a 22 y.o. female with type 2 diabetes, status post cholecystectomy in 2021 presenting for nausea and vomiting.  Started on Monday.  Also endorses associated upper abdominal pain.  Denies fever denies and recent sick contacts.  States she had a normal bowel movement this morning. Denies urinary changes.  Output is nonbloody.  Recently started on Ozempic 2 weeks ago.  Denies alcohol and marijuana use. LMP last week.    Abdominal Pain      Home Medications Prior to Admission medications   Medication Sig Start Date End Date Taking? Authorizing Provider  Continuous Glucose Receiver (FREESTYLE LIBRE 3 READER) DEVI 1 each by Does not apply route 2 (two) times daily. 01/30/23   Dettinger, Elige Radon, MD  Continuous Glucose Sensor (FREESTYLE LIBRE 3 SENSOR) MISC 1 each by Does not apply route every 14 (fourteen) days. Place 1 sensor on the skin every 14 days. Use to check glucose continuously 01/30/23   Dettinger, Elige Radon, MD  nitrofurantoin, macrocrystal-monohydrate, (MACROBID) 100 MG capsule Take 1 capsule (100 mg total) by mouth 2 (two) times daily. 02/08/23  Yes Gareth Eagle, PA-C  albuterol (PROVENTIL HFA;VENTOLIN HFA) 108 (90 Base) MCG/ACT inhaler Inhale 2 puffs into the lungs every 6 (six) hours as needed for wheezing or shortness of breath. 10/08/18   Johna Sheriff, MD  cetirizine (ZYRTEC) 10 MG tablet Take 1 tablet (10 mg total) by mouth at bedtime. 10/27/22   Dettinger, Elige Radon, MD  DULoxetine (CYMBALTA) 30 MG capsule Take 1 capsule (30 mg total) by mouth at bedtime. 10/27/22   Dettinger, Elige Radon, MD  famotidine (PEPCID) 20 MG tablet Take 1 tablet (20 mg total) by mouth 2 (two) times daily for 14 days. 12/20/22 01/03/23  Sonny Masters, FNP  fluticasone (FLONASE) 50  MCG/ACT nasal spray Place 2 sprays into both nostrils daily. 12/03/21   Dettinger, Elige Radon, MD  hydrOXYzine (VISTARIL) 25 MG capsule Take 1 capsule (25 mg total) by mouth every 8 (eight) hours as needed. 12/20/22   Sonny Masters, FNP  metFORMIN (GLUCOPHAGE) 500 MG tablet Take 1 tablet (500 mg total) by mouth 2 (two) times daily with a meal. 10/27/22   Dettinger, Elige Radon, MD  montelukast (SINGULAIR) 10 MG tablet Take 1 tablet (10 mg total) by mouth at bedtime. 10/27/22   Dettinger, Elige Radon, MD  norethindrone-ethinyl estradiol-iron (LARIN FE 1.5/30) 1.5-30 MG-MCG tablet TAKE 1 TABLET BY MOUTH DAILY. SKIP INACTIVE PILLS FOR TWO MONTHS,STARTING NEW PACK IMMEDIATELY, THEN TAKE FOR THIRD MONTH 01/27/23   Dettinger, Elige Radon, MD  ondansetron (ZOFRAN-ODT) 4 MG disintegrating tablet Take 1 tablet (4 mg total) by mouth every 8 (eight) hours as needed for nausea or vomiting. 01/12/23   Small, Brooke L, PA  rizatriptan (MAXALT) 10 MG tablet TAKE 1 TABLET BY MOUTH AS NEEDED. MAY REPEAT IN TWO HOURS IF NEEDED 10/14/21   Dettinger, Elige Radon, MD  Semaglutide,0.25 or 0.5MG /DOS, 2 MG/3ML SOPN Inject 0.5 mg into the skin once a week. 01/27/23   Dettinger, Elige Radon, MD  topiramate (TOPAMAX) 50 MG tablet Take 1 tablet (50 mg total) by mouth 2 (two) times daily. 10/27/22   Dettinger, Elige Radon, MD  Ubrogepant (UBRELVY) 50 MG TABS Take 50 mg by mouth as  directed. Take at onset of headache, may repeat after 2 hours if headache still present. Max 2 tabs per day. 08/05/19   Dettinger, Elige Radon, MD      Allergies    Bee venom, Penicillins, Azithromycin, and Cephalosporins    Review of Systems   Review of Systems  Gastrointestinal:  Positive for abdominal pain.    Physical Exam   Vitals:   02/08/23 1141  BP: (!) 143/98  Pulse: 97  Resp: 20  Temp: 98.3 F (36.8 C)  SpO2: 99%    CONSTITUTIONAL:  well-appearing, NAD NEURO:  Alert and oriented x 3, CN 3-12 grossly intact EYES:  eyes equal and reactive ENT/NECK:  Supple,  no stridor  CARDIO:  regular rate and rhythm, appears well-perfused  PULM:  No respiratory distress, CTAB GI/GU:  non-distended, soft non tender MSK/SPINE:  No gross deformities, no edema, moves all extremities  SKIN:  no rash, atraumatic  *Additional and/or pertinent findings included in MDM below  ED Results / Procedures / Treatments   Labs (all labs ordered are listed, but only abnormal results are displayed) Labs Reviewed  COMPREHENSIVE METABOLIC PANEL - Abnormal; Notable for the following components:      Result Value   Glucose, Bld 133 (*)    AST 14 (*)    All other components within normal limits  CBC - Abnormal; Notable for the following components:   WBC 15.1 (*)    Platelets 441 (*)    All other components within normal limits  URINALYSIS, ROUTINE W REFLEX MICROSCOPIC - Abnormal; Notable for the following components:   APPearance HAZY (*)    Hgb urine dipstick SMALL (*)    Protein, ur 100 (*)    Leukocytes,Ua SMALL (*)    Bacteria, UA RARE (*)    All other components within normal limits  LIPASE, BLOOD  PREGNANCY, URINE    EKG None  Radiology No results found.  Procedures Procedures    Medications Ordered in ED Medications  nitrofurantoin (MACRODANTIN) capsule 100 mg (has no administration in time range)  ondansetron (ZOFRAN-ODT) disintegrating tablet 8 mg (8 mg Oral Given 02/08/23 1254)  sodium chloride 0.9 % bolus 1,000 mL (0 mLs Intravenous Stopped 02/08/23 1722)  ondansetron (ZOFRAN) injection 4 mg (4 mg Intravenous Given 02/08/23 1616)    ED Course/ Medical Decision Making/ A&P                             Medical Decision Making Amount and/or Complexity of Data Reviewed Labs: ordered.  Risk Prescription drug management.   22 year old female who is well-appearing presenting for nausea and vomiting.  Exam was unremarkable.  DDx includes foodborne illness, pancreatitis, other intra-abdominal infection, UTI, pregnancy.  Overall appears clinically  well.  Suspect given the acute onset of her symptoms likely foodborne illness versus viral gastroenteritis.  Volume resuscitated with normal saline.  Treated nausea vomiting with Zofran.  Patient states she felt much better after treatment. Advised conservative treatment at home. Urinalysis did show evidence of UTI. Hematuria could be explained by recent menstrual period.  Started on Macrobid.  Also advised to follow-up with her PCP. Discussed return precautions.  Vital stable at discharge.        Final Clinical Impression(s) / ED Diagnoses Final diagnoses:  Urinary tract infection with hematuria, site unspecified    Rx / DC Orders ED Discharge Orders          Ordered    nitrofurantoin,  macrocrystal-monohydrate, (MACROBID) 100 MG capsule  2 times daily        02/08/23 1836              Gareth Eagle, PA-C 02/08/23 1839    Jacalyn Lefevre, MD 02/08/23 2136

## 2023-02-08 NOTE — ED Provider Triage Note (Signed)
Emergency Medicine Provider Triage Evaluation Note  Ruth King , a 22 y.o. female  was evaluated in triage.  Pt complains of nausea, vomiting, epigastric abdominal pain.  Patient's been with symptoms for the past 3 days.  States she recently began Ozempic 2 weeks ago.  Denies any alcohol use.  Denies radiation of pain.  Denies hematemesis, urinary symptoms, change in bowel habits.  Review of Systems  Positive: See above Negative:  Physical Exam  BP (!) 143/98 (BP Location: Right Arm)   Pulse 97   Temp 98.3 F (36.8 C) (Oral)   Resp 20   Ht 6' (1.829 m)   Wt 127.9 kg   LMP 02/01/2023 (Approximate)   SpO2 99%   BMI 38.25 kg/m  Gen:   Awake, no distress   Resp:  Normal effort  MSK:   Moves extremities without difficulty  Other:  Epigastric tenderness to palpation  Medical Decision Making  Medically screening exam initiated at 12:35 PM.  Appropriate orders placed.  Ruth King was informed that the remainder of the evaluation will be completed by another provider, this initial triage assessment does not replace that evaluation, and the importance of remaining in the ED until their evaluation is complete.     Peter Garter, [place] 02/08/23 1235

## 2023-02-08 NOTE — Discharge Instructions (Addendum)
Evaluation of your nausea and vomiting are overall reassuring.  Suspect it was a foodborne illness or viral gastroenteritis.  Treatment is conservative at home which includes assertive hydration with water and Gatorade.  Also recommend you follow-up with your PCP.  Urinalysis did reveal concern for UTI.  Have started you on Macrobid which is an antibiotic.  Please take it for 5 days.  Also recommend you follow-up with your PCP for this as well.  If you have worsening abdominal pain, blood in in the urine vomit or stool, fever or any other concern please return emergency department further evaluation.

## 2023-02-08 NOTE — ED Triage Notes (Signed)
Pt c/o mid upper abd pain x 3 days. N/v x 5 in last 24 hours. Denies diarrhea. Lnbm today. Color wnl. Holding emesis bag to face. New med Ozempic added x 2 weeks ago. C/o dizziness

## 2023-02-09 ENCOUNTER — Telehealth: Payer: Self-pay

## 2023-02-09 NOTE — Transitions of Care (Post Inpatient/ED Visit) (Signed)
02/09/2023  Name: Ruth King MRN: [id number] DOB: 12-26-2000  Today's TOC FU Call Status: Today's TOC FU Call Status:: Successful TOC FU Call Competed TOC FU Call Complete Date: 02/09/23  Transition Care Management Follow-up Telephone Call Date of Discharge: 02/08/23 [name] Type of Discharge: Emergency Department Reason for ED Visit: Other: (UTI) How have you been since you were released from the hospital?: Better Any questions or concerns?: No  Items Reviewed: Did you receive and understand the discharge instructions provided?: Yes Medications obtained,verified, and reconciled?: Yes (Medications Reviewed) Any new allergies since your discharge?: No Dietary orders reviewed?: Yes Do you have support at home?: Yes People in Home: parent(s)  Medications Reviewed Today: Medications Reviewed Today     Reviewed by Karena Addison, LPN (Licensed Practical Nurse) on 02/09/23 at 1129  Med List Status: <None>   Medication Order Taking? Sig Documenting Provider Last Dose Status Informant  albuterol (PROVENTIL HFA;VENTOLIN HFA) 108 (90 Base) MCG/ACT inhaler [id number] Yes Inhale 2 puffs into the lungs every 6 (six) hours as needed for wheezing or shortness of breath. Johna Sheriff, MD Taking Active Self  cetirizine (ZYRTEC) 10 MG tablet [id number] Yes Take 1 tablet (10 mg total) by mouth at bedtime. Dettinger, Elige Radon, MD Taking Active   Continuous Glucose Receiver (FREESTYLE LIBRE 3 READER) DEVI [id number] Yes 1 each by Does not apply route 2 (two) times daily. Dettinger, Elige Radon, MD Taking Active   Continuous Glucose Sensor (FREESTYLE LIBRE 3 SENSOR) [place] [id number] Yes 1 each by Does not apply route every 14 (fourteen) days. Place 1 sensor on the skin every 14 days. Use to check glucose continuously Dettinger, Elige Radon, MD Taking Active   DULoxetine (CYMBALTA) 30 MG capsule [id number] Yes Take 1 capsule (30 mg total) by mouth at bedtime. Dettinger, Elige Radon, MD Taking Active   famotidine (PEPCID) 20 MG tablet [id number]  Take 1 tablet (20 mg total) by mouth 2 (two) times daily for 14 days. Sonny Masters, FNP  Expired 01/03/23 2359   fluticasone (FLONASE) 50 MCG/ACT nasal spray [id number] Yes Place 2 sprays into both nostrils daily. Dettinger, Elige Radon, MD Taking Active   hydrOXYzine (VISTARIL) 25 MG capsule [id number] Yes Take 1 capsule (25 mg total) by mouth every 8 (eight) hours as needed. Sonny Masters, FNP Taking Active   metFORMIN (GLUCOPHAGE) 500 MG tablet [id number] Yes Take 1 tablet (500 mg total) by mouth 2 (two) times daily with a meal. Dettinger, Elige Radon, MD Taking Active   montelukast (SINGULAIR) 10 MG tablet [id number] Yes Take 1 tablet (10 mg total) by mouth at bedtime. Dettinger, Elige Radon, MD Taking Active   nitrofurantoin, macrocrystal-monohydrate, (MACROBID) 100 MG capsule [id number] Yes Take 1 capsule (100 mg total) by mouth 2 (two) times daily. Gareth Eagle, PA-C Taking Active   norethindrone-ethinyl estradiol-iron Elmarie Shiley FE 1.5/30) 1.5-30 MG-MCG tablet [id number] Yes TAKE 1 TABLET BY MOUTH DAILY. SKIP INACTIVE PILLS FOR TWO MONTHS,STARTING NEW PACK IMMEDIATELY, THEN TAKE FOR THIRD MONTH Dettinger, Elige Radon, MD Taking Active   ondansetron (ZOFRAN) 4 MG tablet [id number] Yes Take 1 tablet (4 mg total) by mouth every 6 (six) hours. Gareth Eagle, PA-C Taking Active   ondansetron (ZOFRAN-ODT) 4 MG disintegrating tablet [id number] Yes Take 1 tablet (4 mg total) by mouth every 8 (eight) hours as needed for nausea or vomiting. Small, Brooke L, PA Taking Active   rizatriptan (MAXALT) 10 MG tablet [id number] Yes TAKE 1 TABLET BY MOUTH AS NEEDED.  MAY REPEAT IN TWO HOURS IF NEEDED Dettinger, Elige Radon, MD Taking Active   Semaglutide,0.25 or 0.5MG /DOS, 2 MG/3ML SOPN [id number] Yes Inject 0.5 mg into the skin once a week. Dettinger, Elige Radon, MD Taking Active   topiramate (TOPAMAX) 50 MG tablet [id number] Yes Take 1 tablet (50 mg total) by mouth 2  (two) times daily. Dettinger, Elige Radon, MD Taking Active   Ubrogepant (UBRELVY) 50 MG TABS [id number] Yes Take 50 mg by mouth as directed. Take at onset of headache, may repeat after 2 hours if headache still present. Max 2 tabs per day. Dettinger, Elige Radon, MD Taking Active Self            Home Care and Equipment/Supplies: Were Home Health Services Ordered?: NA Any new equipment or medical supplies ordered?: NA  Functional Questionnaire: Do you need assistance with bathing/showering or dressing?: No Do you need assistance with meal preparation?: No Do you need assistance with eating?: No Do you have difficulty maintaining continence: No Do you need assistance with getting out of bed/getting out of a chair/moving?: No Do you have difficulty managing or taking your medications?: No  Follow up appointments reviewed: PCP Follow-up appointment confirmed?: No (no avail appt time, sent message to staff to schedule) MD Provider Line [phone] Given: No Specialist Hospital Follow-up appointment confirmed?: NA Do you need transportation to your follow-up appointment?: No Do you understand care options if your condition(s) worsen?: Yes-patient verbalized understanding    SIGNATURE Karena Addison, LPN [facility] Nurse Health Advisor Direct Dial [phone]

## 2023-02-13 ENCOUNTER — Telehealth: Payer: Self-pay

## 2023-02-13 NOTE — Telephone Encounter (Signed)
FreeStyle Libre 3 Sensor  Key: [id number]  PA Case ID #: [id number] Rx #: [id number]  Sent to plan

## 2023-02-14 ENCOUNTER — Other Ambulatory Visit (HOSPITAL_COMMUNITY): Payer: Self-pay

## 2023-02-14 NOTE — Telephone Encounter (Signed)
Patient Advocate Encounter  Prior Authorization for [employer] 3 Sensor has been approved with [employer].    PA# [id number] Effective dates: 02/13/23 through 08/16/23  Per [facility] test claim, copay for 28 days supply is $4

## 2023-04-21 ENCOUNTER — Other Ambulatory Visit: Payer: Self-pay

## 2023-04-21 MED ORDER — ONDANSETRON 4 MG PO TBDP: 4.0000 mg | ORAL_TABLET | Freq: Three times a day (TID) | ORAL | 0 refills | Status: DC | PRN

## 2023-05-01 ENCOUNTER — Encounter: Payer: Self-pay | Admitting: Family Medicine

## 2023-05-01 ENCOUNTER — Ambulatory Visit: Payer: Medicaid Other | Admitting: Family Medicine

## 2023-05-01 ENCOUNTER — Ambulatory Visit (INDEPENDENT_AMBULATORY_CARE_PROVIDER_SITE_OTHER): Payer: Medicaid Other | Admitting: Family Medicine

## 2023-05-01 VITALS — BP 137/82 | HR 87 | Ht 72.0 in | Wt 274.0 lb

## 2023-05-01 DIAGNOSIS — E282 Polycystic ovarian syndrome: Secondary | ICD-10-CM | POA: Diagnosis not present

## 2023-05-01 DIAGNOSIS — E1169 Type 2 diabetes mellitus with other specified complication: Secondary | ICD-10-CM | POA: Diagnosis not present

## 2023-05-01 LAB — BAYER DCA HB A1C WAIVED: HB A1C (BAYER DCA - WAIVED): 7.2 % — ABNORMAL HIGH (ref 4.8–5.6)

## 2023-05-01 NOTE — Progress Notes (Signed)
BP 137/82   Pulse 87   Ht 6' (1.829 m)   Wt 274 lb (124.3 kg)   SpO2 99%   BMI 37.16 kg/m    Subjective:   Patient ID: Ruth King, female    DOB: 06/06/2001, 22 y.o.   MRN: [id number]  HPI: Ruth King is a 22 y.o. female presenting on 05/01/2023 for Medical Management of Chronic Issues and Diabetes   HPI Type 2 diabetes mellitus Patient comes in today for recheck of his diabetes. Patient has been currently taking Ozempic and metformin. Patient is not currently on an ACE inhibitor/ARB. Patient has not seen an ophthalmologist this year. Patient denies any new issues with their feet. The symptom started onset as an adult PCOS ARE RELATED TO DM   Patient has PCOS and prediabetes.  Rechecking that today.  She currently takes oral birth control pills  Depression recheck Patient has depression and currently takes Cymbalta.  She feels like she is doing well and works going well.  She denies any major issues.  Denies any suicidal ideations or thoughts of hurting herself.    05/01/2023    3:06 PM 01/27/2023    1:09 PM 10/27/2022   10:56 AM 09/02/2022   10:38 AM 04/21/2022   12:36 PM  Depression screen PHQ 2/9  Decreased Interest 0 0 0 0 0  Down, Depressed, Hopeless 0 0 0 0 0  PHQ - 2 Score 0 0 0 0 0  Altered sleeping 0 0 0 0 0  Tired, decreased energy 0 0 0 0 0  Change in appetite 0 0 0 0 0  Feeling bad or failure about yourself  0 0   0  Trouble concentrating 0 0 0 0 0  Moving slowly or fidgety/restless 0 0 0 0 0  Suicidal thoughts 0 0 0 0 0  PHQ-9 Score 0 0 0 0 0  Difficult doing work/chores Not difficult at all Not difficult at all Not difficult at all Not difficult at all Not difficult at all     Relevant past medical, surgical, family and social history reviewed and updated as indicated. Interim medical history since our last visit reviewed. Allergies and medications reviewed and updated.  Review of Systems  Constitutional:  Negative for chills and fever.  Eyes:   Negative for visual disturbance.  Respiratory:  Negative for chest tightness and shortness of breath.   Cardiovascular:  Negative for chest pain and leg swelling.  Musculoskeletal:  Negative for back pain and gait problem.  Skin:  Negative for rash.  Neurological:  Negative for dizziness, light-headedness and headaches.  Psychiatric/Behavioral:  Negative for agitation and behavioral problems.   All other systems reviewed and are negative.   Per HPI unless specifically indicated above   Allergies as of 05/01/2023       Reactions   Bee Venom Shortness Of Breath   Penicillins Hives   Azithromycin Other (See Comments)   Not effective   Cephalosporins Hives        Medication List        Accurate as of May 01, 2023  3:39 PM. If you have any questions, ask your nurse or doctor.          STOP taking these medications    nitrofurantoin (macrocrystal-monohydrate) 100 MG capsule Commonly known as: MACROBID Stopped by: Elige Radon Willye Javier       TAKE these medications    albuterol 108 (90 Base) MCG/ACT inhaler Commonly known as: VENTOLIN HFA Inhale  2 puffs into the lungs every 6 (six) hours as needed for wheezing or shortness of breath.   cetirizine 10 MG tablet Commonly known as: ZYRTEC Take 1 tablet (10 mg total) by mouth at bedtime.   DULoxetine 30 MG capsule Commonly known as: CYMBALTA Take 1 capsule (30 mg total) by mouth at bedtime.   famotidine 20 MG tablet Commonly known as: Pepcid Take 1 tablet (20 mg total) by mouth 2 (two) times daily for 14 days.   fluticasone 50 MCG/ACT nasal spray Commonly known as: FLONASE Place 2 sprays into both nostrils daily.   FreeStyle Libre 3 Reader Devi 1 each by Does not apply route 2 (two) times daily.   FreeStyle Libre 3 Sensor Misc 1 each by Does not apply route every 14 (fourteen) days. Place 1 sensor on the skin every 14 days. Use to check glucose continuously   hydrOXYzine 25 MG capsule Commonly known as:  VISTARIL Take 1 capsule (25 mg total) by mouth every 8 (eight) hours as needed.   metFORMIN 500 MG tablet Commonly known as: GLUCOPHAGE Take 1 tablet (500 mg total) by mouth 2 (two) times daily with a meal.   montelukast 10 MG tablet Commonly known as: SINGULAIR Take 1 tablet (10 mg total) by mouth at bedtime.   norethindrone-ethinyl estradiol-iron 1.5-30 MG-MCG tablet Commonly known as: Larin Fe 1.5/30 TAKE 1 TABLET BY MOUTH DAILY. SKIP INACTIVE PILLS FOR TWO MONTHS,STARTING NEW PACK IMMEDIATELY, THEN TAKE FOR THIRD MONTH   ondansetron 4 MG disintegrating tablet Commonly known as: ZOFRAN-ODT Take 1 tablet (4 mg total) by mouth every 8 (eight) hours as needed for nausea or vomiting.   ondansetron 4 MG tablet Commonly known as: ZOFRAN Take 1 tablet (4 mg total) by mouth every 6 (six) hours.   rizatriptan 10 MG tablet Commonly known as: MAXALT TAKE 1 TABLET BY MOUTH AS NEEDED. MAY REPEAT IN TWO HOURS IF NEEDED   Semaglutide(0.25 or 0.5MG /DOS) 2 MG/3ML Sopn Inject 0.5 mg into the skin once a week.   topiramate 50 MG tablet Commonly known as: TOPAMAX Take 1 tablet (50 mg total) by mouth 2 (two) times daily.   Ubrelvy 50 MG Tabs Generic drug: Ubrogepant Take 50 mg by mouth as directed. Take at onset of headache, may repeat after 2 hours if headache still present. Max 2 tabs per day.         Objective:   BP 137/82   Pulse 87   Ht 6' (1.829 m)   Wt 274 lb (124.3 kg)   SpO2 99%   BMI 37.16 kg/m   Wt Readings from Last 3 Encounters:  05/01/23 274 lb (124.3 kg)  02/08/23 282 lb (127.9 kg)  01/27/23 282 lb (127.9 kg)    Physical Exam Vitals and nursing note reviewed.  Constitutional:      General: She is not in acute distress.    Appearance: She is well-developed. She is not diaphoretic.  Eyes:     Conjunctiva/sclera: Conjunctivae normal.  Cardiovascular:     Rate and Rhythm: Normal rate and regular rhythm.     Heart sounds: Normal heart sounds. No murmur  heard. Pulmonary:     Effort: Pulmonary effort is normal. No respiratory distress.     Breath sounds: Normal breath sounds. No wheezing.  Musculoskeletal:        General: No swelling or tenderness. Normal range of motion.  Skin:    General: Skin is warm and dry.     Findings: No rash.  Neurological:  Mental Status: She is alert and oriented to person, place, and time.     Coordination: Coordination normal.  Psychiatric:        Behavior: Behavior normal.       Assessment & Plan:   Problem List Items Addressed This Visit       Endocrine   Type 2 diabetes mellitus with other specified complication (HCC) - Primary   Relevant Orders   CBC with Differential/Platelet   CMP14+EGFR   Lipid panel   Bayer DCA Hb A1c Waived   PCOS (polycystic ovarian syndrome)    A1c 7.2, showing improvement, working on weight loss and showing improvement and trying to change diet.  Continue with current plans, no change in medicine.  She does get the occasional low at night but not too often so will not increase her diabetes medicines at this point. Follow up plan: Return in about 3 months (around 08/01/2023), or if symptoms worsen or fail to improve, for Diabetes recheck.  Counseling provided for all of the vaccine components Orders Placed This Encounter  Procedures   CBC with Differential/Platelet   CMP14+EGFR   Lipid panel   Bayer DCA Hb A1c Waived    Arville Care, MD [facility] 05/01/2023, 3:39 PM

## 2023-06-05 ENCOUNTER — Emergency Department (HOSPITAL_COMMUNITY)
Admission: EM | Admit: 2023-06-05 | Discharge: 2023-06-05 | Disposition: A | Discharge status: Home / Self Care | Payer: Medicaid Other | Attending: Emergency Medicine | Admitting: Emergency Medicine

## 2023-06-05 ENCOUNTER — Encounter (HOSPITAL_COMMUNITY): Payer: Self-pay

## 2023-06-05 ENCOUNTER — Emergency Department (HOSPITAL_COMMUNITY): Payer: Medicaid Other

## 2023-06-05 DIAGNOSIS — Z8616 Personal history of COVID-19: Secondary | ICD-10-CM | POA: Insufficient documentation

## 2023-06-05 DIAGNOSIS — R112 Nausea with vomiting, unspecified: Secondary | ICD-10-CM | POA: Insufficient documentation

## 2023-06-05 DIAGNOSIS — R197 Diarrhea, unspecified: Secondary | ICD-10-CM | POA: Diagnosis not present

## 2023-06-05 LAB — CBC WITH DIFFERENTIAL/PLATELET
Abs Immature Granulocytes: 0.05 10*3/uL (ref 0.00–0.07)
Basophils Absolute: 0 10*3/uL (ref 0.0–0.1)
Basophils Relative: 0 %
Eosinophils Absolute: 0.3 10*3/uL (ref 0.0–0.5)
Eosinophils Relative: 3 %
HCT: 44 % (ref 36.0–46.0)
Hemoglobin: 14.4 g/dL (ref 12.0–15.0)
Immature Granulocytes: 0 %
Lymphocytes Relative: 19 %
Lymphs Abs: 2.4 10*3/uL (ref 0.7–4.0)
MCH: 28.3 pg (ref 26.0–34.0)
MCHC: 32.7 g/dL (ref 30.0–36.0)
MCV: 86.6 fL (ref 80.0–100.0)
Monocytes Absolute: 0.5 10*3/uL (ref 0.1–1.0)
Monocytes Relative: 4 %
Neutro Abs: 9.5 10*3/uL — ABNORMAL HIGH (ref 1.7–7.7)
Neutrophils Relative %: 74 %
Platelets: 443 10*3/uL — ABNORMAL HIGH (ref 150–400)
RBC: 5.08 MIL/uL (ref 3.87–5.11)
RDW: 13.1 % (ref 11.5–15.5)
WBC: 12.8 10*3/uL — ABNORMAL HIGH (ref 4.0–10.5)
nRBC: 0 % (ref 0.0–0.2)

## 2023-06-05 LAB — URINALYSIS, ROUTINE W REFLEX MICROSCOPIC
Bilirubin Urine: NEGATIVE
Glucose, UA: NEGATIVE mg/dL
Ketones, ur: NEGATIVE mg/dL
Nitrite: NEGATIVE
Protein, ur: NEGATIVE mg/dL
Specific Gravity, Urine: 1.015 (ref 1.005–1.030)
pH: 5 (ref 5.0–8.0)

## 2023-06-05 LAB — COMPREHENSIVE METABOLIC PANEL
ALT: 43 U/L (ref 0–44)
AST: 28 U/L (ref 15–41)
Albumin: 4.1 g/dL (ref 3.5–5.0)
Alkaline Phosphatase: 85 U/L (ref 38–126)
Anion gap: 13 (ref 5–15)
BUN: 12 mg/dL (ref 6–20)
CO2: 20 mmol/L — ABNORMAL LOW (ref 22–32)
Calcium: 9.5 mg/dL (ref 8.9–10.3)
Chloride: 104 mmol/L (ref 98–111)
Creatinine, Ser: 0.47 mg/dL (ref 0.44–1.00)
GFR, Estimated: >60 mL/min (ref >60)
Glucose, Bld: 111 mg/dL — ABNORMAL HIGH (ref 70–99)
Potassium: 4.2 mmol/L (ref 3.5–5.1)
Sodium: 137 mmol/L (ref 135–145)
Total Bilirubin: 0.4 mg/dL (ref 0.3–1.2)
Total Protein: 7.7 g/dL (ref 6.5–8.1)

## 2023-06-05 LAB — PREGNANCY, URINE: Preg Test, Ur: NEGATIVE

## 2023-06-05 LAB — LIPASE, BLOOD: Lipase: 25 U/L (ref 11–51)

## 2023-06-05 MED ORDER — SODIUM CHLORIDE 0.9 % IV BOLUS
1000.0000 mL | Freq: Once | INTRAVENOUS | Status: AC
  Administered 2023-06-05: 1000 mL via INTRAVENOUS

## 2023-06-05 MED ORDER — PROMETHAZINE HCL 25 MG PO TABS
25.0000 mg | ORAL_TABLET | Freq: Four times a day (QID) | ORAL | 0 refills | Status: AC | PRN
Start: 2023-06-05 — End: ?

## 2023-06-05 MED ORDER — ONDANSETRON HCL 4 MG/2ML IJ SOLN
4.0000 mg | Freq: Once | INTRAMUSCULAR | Status: AC
  Administered 2023-06-05: 4 mg via INTRAVENOUS
  Filled 2023-06-05: qty 2

## 2023-06-05 MED ORDER — IOHEXOL 300 MG/ML  SOLN
100.0000 mL | Freq: Once | INTRAMUSCULAR | Status: AC | PRN
  Administered 2023-06-05: 100 mL via INTRAVENOUS

## 2023-06-05 NOTE — ED Notes (Signed)
Pt provided ice chips to see if she can tolerate keeping them down, per PA's direction.

## 2023-06-05 NOTE — Discharge Instructions (Signed)
As discussed, frequent small amounts of liquids, bland diet as tolerated.  You may take over-the-counter Imodium as directed for your diarrhea.  Your workup was overall reassuring.  Your symptoms are likely related to your recent COVID diagnosis.  Please stop your ondansetron, you have been prescribed a different medication for your nausea vomiting.  This may cause drowsiness, please avoid operating machinery or driving while taking this medication.  Please follow-up with your primary care provider for recheck if needed.

## 2023-06-05 NOTE — ED Provider Notes (Signed)
[place] EMERGENCY DEPARTMENT AT [facility] Provider Note   CSN: [id number] click on incomplete noted on Arrival date & time: 06/05/23  1144     History  Chief Complaint  Patient presents with   Abdominal Pain   Emesis   Diarrhea   Covid Positive    Ruth King is a 22 y.o. female.   Abdominal Pain Associated symptoms: diarrhea, nausea and vomiting   Associated symptoms: no chest pain, no chills, no dysuria, no fever and no shortness of breath   Emesis Associated symptoms: abdominal pain and diarrhea   Associated symptoms: no chills and no fever   Diarrhea Associated symptoms: abdominal pain and vomiting   Associated symptoms: no chills and no fever        Ruth King is a 22 y.o. female who presents to the Emergency Department complaining of upper abdominal pain, nausea, vomiting, and diarrhea.  Diagnosed with COVID at urgent care last Wednesday.  Was given antiemetic without relief.  Continues to have vomiting and loose watery stools.  States vomitus and stools are nonbloody or black.  Describes constant cramping type pain to her upper abdomen that is nonradiating.  She denies any fever or chills.  No dysuria or flank pain.  Denies any pain radiating into her back.  Home Medications Prior to Admission medications   Medication Sig Start Date End Date Taking? Authorizing Provider  albuterol (PROVENTIL HFA;VENTOLIN HFA) 108 (90 Base) MCG/ACT inhaler Inhale 2 puffs into the lungs every 6 (six) hours as needed for wheezing or shortness of breath. 10/08/18   Johna Sheriff, MD  cetirizine (ZYRTEC) 10 MG tablet Take 1 tablet (10 mg total) by mouth at bedtime. 10/27/22   Dettinger, Elige Radon, MD  Continuous Glucose Receiver (FREESTYLE LIBRE 3 READER) DEVI 1 each by Does not apply route 2 (two) times daily. 01/30/23   Dettinger, Elige Radon, MD  Continuous Glucose Sensor (FREESTYLE LIBRE 3 SENSOR) MISC 1 each by Does not apply route every 14 (fourteen) days. Place 1  sensor on the skin every 14 days. Use to check glucose continuously 01/30/23   Dettinger, Elige Radon, MD  DULoxetine (CYMBALTA) 30 MG capsule Take 1 capsule (30 mg total) by mouth at bedtime. 10/27/22   Dettinger, Elige Radon, MD  famotidine (PEPCID) 20 MG tablet Take 1 tablet (20 mg total) by mouth 2 (two) times daily for 14 days. 12/20/22 01/03/23  Sonny Masters, FNP  fluticasone (FLONASE) 50 MCG/ACT nasal spray Place 2 sprays into both nostrils daily. 12/03/21   Dettinger, Elige Radon, MD  hydrOXYzine (VISTARIL) 25 MG capsule Take 1 capsule (25 mg total) by mouth every 8 (eight) hours as needed. 12/20/22   Sonny Masters, FNP  metFORMIN (GLUCOPHAGE) 500 MG tablet Take 1 tablet (500 mg total) by mouth 2 (two) times daily with a meal. 10/27/22   Dettinger, Elige Radon, MD  montelukast (SINGULAIR) 10 MG tablet Take 1 tablet (10 mg total) by mouth at bedtime. 10/27/22   Dettinger, Elige Radon, MD  norethindrone-ethinyl estradiol-iron (LARIN FE 1.5/30) 1.5-30 MG-MCG tablet TAKE 1 TABLET BY MOUTH DAILY. SKIP INACTIVE PILLS FOR TWO MONTHS,STARTING NEW PACK IMMEDIATELY, THEN TAKE FOR THIRD MONTH 01/27/23   Dettinger, Elige Radon, MD  ondansetron (ZOFRAN) 4 MG tablet Take 1 tablet (4 mg total) by mouth every 6 (six) hours. 02/08/23   Gareth Eagle, PA-C  ondansetron (ZOFRAN-ODT) 4 MG disintegrating tablet Take 1 tablet (4 mg total) by mouth every 8 (eight) hours as  needed for nausea or vomiting. 04/21/23   Dettinger, Elige Radon, MD  rizatriptan (MAXALT) 10 MG tablet TAKE 1 TABLET BY MOUTH AS NEEDED. MAY REPEAT IN TWO HOURS IF NEEDED 10/14/21   Dettinger, Elige Radon, MD  Semaglutide,0.25 or 0.5MG /DOS, 2 MG/3ML SOPN Inject 0.5 mg into the skin once a week. 01/27/23   Dettinger, Elige Radon, MD  topiramate (TOPAMAX) 50 MG tablet Take 1 tablet (50 mg total) by mouth 2 (two) times daily. 10/27/22   Dettinger, Elige Radon, MD  Ubrogepant (UBRELVY) 50 MG TABS Take 50 mg by mouth as directed. Take at onset of headache, may repeat after 2 hours if headache  still present. Max 2 tabs per day. 08/05/19   Dettinger, Elige Radon, MD      Allergies    Bee venom, Penicillins, Azithromycin, and Cephalosporins    Review of Systems   Review of Systems  Constitutional:  Positive for appetite change. Negative for chills and fever.  Respiratory:  Negative for shortness of breath.   Cardiovascular:  Negative for chest pain.  Gastrointestinal:  Positive for abdominal pain, diarrhea, nausea and vomiting.  Genitourinary:  Negative for dysuria.  Neurological:  Positive for weakness. Negative for dizziness and numbness.    Physical Exam Updated Vital Signs BP (!) 147/102 (BP Location: Right Arm)   Pulse 95   Temp 98.8 F (37.1 C) (Oral)   Resp 18   Ht 6' (1.829 m)   Wt 117.9 kg   SpO2 99%   BMI 35.26 kg/m  Physical Exam Vitals and nursing note reviewed.  Constitutional:      General: She is not in acute distress.    Appearance: She is well-developed. She is not ill-appearing or toxic-appearing.  HENT:     Mouth/Throat:     Mouth: Mucous membranes are moist.  Eyes:     Conjunctiva/sclera: Conjunctivae normal.  Cardiovascular:     Rate and Rhythm: Normal rate and regular rhythm.     Pulses: Normal pulses.  Pulmonary:     Effort: Pulmonary effort is normal. No respiratory distress.  Abdominal:     General: There is no distension.     Palpations: Abdomen is soft.     Tenderness: There is no abdominal tenderness. There is no right CVA tenderness, left CVA tenderness or guarding.  Musculoskeletal:        General: Normal range of motion.     Cervical back: No rigidity.  Skin:    General: Skin is warm.     Capillary Refill: Capillary refill takes less than 2 seconds.  Neurological:     General: No focal deficit present.     Mental Status: She is alert.     Sensory: No sensory deficit.     Motor: No weakness.     ED Results / Procedures / Treatments   Labs (all labs ordered are listed, but only abnormal results are displayed) Labs  Reviewed  COMPREHENSIVE METABOLIC PANEL - Abnormal; Notable for the following components:      Result Value   CO2 20 (*)    Glucose, Bld 111 (*)    All other components within normal limits  CBC WITH DIFFERENTIAL/PLATELET - Abnormal; Notable for the following components:   WBC 12.8 (*)    Platelets 443 (*)    Neutro Abs 9.5 (*)    All other components within normal limits  URINALYSIS, ROUTINE W REFLEX MICROSCOPIC - Abnormal; Notable for the following components:   Hgb urine dipstick SMALL (*)  Leukocytes,Ua TRACE (*)    Bacteria, UA RARE (*)    All other components within normal limits  LIPASE, BLOOD  PREGNANCY, URINE    EKG None  Radiology No results found.  Procedures Procedures    Medications Ordered in ED Medications  sodium chloride 0.9 % bolus 1,000 mL (has no administration in time range)  ondansetron (ZOFRAN) injection 4 mg (has no administration in time range)    ED Course/ Medical Decision Making/ A&P                                Medical Decision Making Patient here with symptoms secondary to recent COVID diagnosis.  Reported loose stools nausea vomiting and abdominal pain, GI symptoms not improved with previously prescribed antiemetic.  Concerned she is dehydrated.  Symptoms felt to be secondary to COVID.  Gastroenteritis or other viral process also considered.  Acute abdomen also considered but felt less likely as patient has reassuring abdominal exam.  She is nontoxic-appearing.  Amount and/or Complexity of Data Reviewed Labs: ordered.    Details: Labs interpreted by me, reassuring without acute derangement Radiology: ordered.    Details: CT abdomen and pelvis ordered for further evaluation.  No acute intra-abdominal process. Discussion of management or test interpretation with external provider(s): Patient given IV fluids and antiemetic here.  Vomiting has improved.  Tolerated fluid challenge.  Appears appropriate for discharge home.  Will continue  symptomatic treatment.  Risk Prescription drug management.           Final Clinical Impression(s) / ED Diagnoses Final diagnoses:  Nausea vomiting and diarrhea    Rx / DC Orders ED Discharge Orders     None         Pauline Aus, PA-C 06/09/23 1410    Bethann Berkshire, MD 06/10/23 1025

## 2023-06-05 NOTE — ED Notes (Signed)
Patient transported to CT 

## 2023-06-05 NOTE — ED Triage Notes (Signed)
Pt reports:  Dx with covid Dx at [facility] last week Loose Stools Started friday Abdomen pain Started  Vomiting Started last Wednesday Off and on

## 2023-06-14 ENCOUNTER — Telehealth: Payer: Self-pay | Admitting: Family Medicine

## 2023-08-01 ENCOUNTER — Encounter: Payer: Self-pay | Admitting: Family Medicine

## 2023-08-01 ENCOUNTER — Ambulatory Visit: Payer: Medicaid Other | Admitting: Family Medicine

## 2023-08-01 ENCOUNTER — Telehealth: Payer: Self-pay | Admitting: Family Medicine

## 2023-08-01 VITALS — BP 133/88 | HR 88 | Ht 72.0 in | Wt 272.0 lb

## 2023-08-01 DIAGNOSIS — E1169 Type 2 diabetes mellitus with other specified complication: Secondary | ICD-10-CM

## 2023-08-01 DIAGNOSIS — E282 Polycystic ovarian syndrome: Secondary | ICD-10-CM

## 2023-08-01 DIAGNOSIS — F339 Major depressive disorder, recurrent, unspecified: Secondary | ICD-10-CM | POA: Diagnosis not present

## 2023-08-01 DIAGNOSIS — G43019 Migraine without aura, intractable, without status migrainosus: Secondary | ICD-10-CM | POA: Diagnosis not present

## 2023-08-01 DIAGNOSIS — Z7984 Long term (current) use of oral hypoglycemic drugs: Secondary | ICD-10-CM

## 2023-08-01 LAB — BAYER DCA HB A1C WAIVED: HB A1C (BAYER DCA - WAIVED): 7.1 % — ABNORMAL HIGH (ref 4.8–5.6)

## 2023-08-01 MED ORDER — SEMAGLUTIDE (1 MG/DOSE) 4 MG/3ML ~~LOC~~ SOPN: 1.0000 mg | PEN_INJECTOR | SUBCUTANEOUS | 3 refills | Status: DC

## 2023-08-01 NOTE — Progress Notes (Signed)
BP 133/88   Pulse 88   Ht 6' (1.829 m)   Wt 272 lb (123.4 kg)   SpO2 99%   BMI 36.89 kg/m    Subjective:   Patient ID: Ruth King, female    DOB: March 08, 2001, 22 y.o.   MRN: [id number]  HPI: REEGHAN King is a 22 y.o. female presenting on 08/01/2023 for Medical Management of Chronic Issues and Diabetes   HPI Type 2 diabetes mellitus Patient comes in today for recheck of his diabetes. Patient has been currently taking metformin and Ozempic. Patient is not currently on an ACE inhibitor/ARB. Patient has not seen an ophthalmologist this year. Patient denies any new issues with their feet. The symptom started onset as an adult PCOS and obesity ARE RELATED TO DM.  Patient also has PCOS and takes metformin and Ozempic to help with this and the diabetes.  Depression recheck Patient is coming in today for depression recheck.  She currently takes duloxetine for this and feels like she is doing well.  She denies any suicidal ideations or thoughts of hurting self or major feelings of anxiety depression.    08/01/2023   11:38 AM 05/01/2023    3:06 PM 01/27/2023    1:09 PM 10/27/2022   10:56 AM 09/02/2022   10:38 AM  Depression screen PHQ 2/9  Decreased Interest 0 0 0 0 0  Down, Depressed, Hopeless 0 0 0 0 0  PHQ - 2 Score 0 0 0 0 0  Altered sleeping 0 0 0 0 0  Tired, decreased energy 0 0 0 0 0  Change in appetite 0 0 0 0 0  Feeling bad or failure about yourself  0 0 0    Trouble concentrating 0 0 0 0 0  Moving slowly or fidgety/restless 0 0 0 0 0  Suicidal thoughts 0 0 0 0 0  PHQ-9 Score 0 0 0 0 0  Difficult doing work/chores Not difficult at all Not difficult at all Not difficult at all Not difficult at all Not difficult at all    Migraines recheck Patient takes Topamax and uses Maxalt as needed and seems to do well with that.  We did try and prescribed Bernita Raisin but she never did end up getting it from her insurance and she has been using the others and she feels like her headache  and migraines are doing okay.  Relevant past medical, surgical, family and social history reviewed and updated as indicated. Interim medical history since our last visit reviewed. Allergies and medications reviewed and updated.  Review of Systems  Constitutional:  Negative for chills and fever.  HENT:  Negative for congestion, ear discharge and ear pain.   Eyes:  Negative for redness and visual disturbance.  Respiratory:  Negative for chest tightness and shortness of breath.   Cardiovascular:  Negative for chest pain and leg swelling.  Genitourinary:  Negative for difficulty urinating and dysuria.  Musculoskeletal:  Negative for back pain and gait problem.  Skin:  Negative for rash.  Neurological:  Positive for headaches. Negative for dizziness and light-headedness.  Psychiatric/Behavioral:  Negative for agitation, behavioral problems, dysphoric mood, self-injury, sleep disturbance and suicidal ideas. The patient is not nervous/anxious and is not hyperactive.   All other systems reviewed and are negative.   Per HPI unless specifically indicated above   Allergies as of 08/01/2023       Reactions   Bee Venom Shortness Of Breath   Penicillins Hives   Azithromycin Other (See  Comments)   Not effective   Cephalosporins Hives        Medication List        Accurate as of August 01, 2023 12:10 PM. If you have any questions, ask your nurse or doctor.          STOP taking these medications    Semaglutide(0.25 or 0.5MG /DOS) 2 MG/3ML Sopn Replaced by: Semaglutide (1 MG/DOSE) 4 MG/3ML Sopn Stopped by: Ivin Booty A Ciera Beckum       TAKE these medications    albuterol 108 (90 Base) MCG/ACT inhaler Commonly known as: VENTOLIN HFA Inhale 2 puffs into the lungs every 6 (six) hours as needed for wheezing or shortness of breath.   cetirizine 10 MG tablet Commonly known as: ZYRTEC Take 1 tablet (10 mg total) by mouth at bedtime.   DULoxetine 30 MG capsule Commonly known as:  CYMBALTA Take 1 capsule (30 mg total) by mouth at bedtime.   famotidine 20 MG tablet Commonly known as: Pepcid Take 1 tablet (20 mg total) by mouth 2 (two) times daily for 14 days.   fluticasone 50 MCG/ACT nasal spray Commonly known as: FLONASE Place 2 sprays into both nostrils daily.   FreeStyle Libre 3 Reader Devi 1 each by Does not apply route 2 (two) times daily.   FreeStyle Libre 3 Sensor Misc 1 each by Does not apply route every 14 (fourteen) days. Place 1 sensor on the skin every 14 days. Use to check glucose continuously   hydrOXYzine 25 MG capsule Commonly known as: VISTARIL Take 1 capsule (25 mg total) by mouth every 8 (eight) hours as needed.   metFORMIN 500 MG tablet Commonly known as: GLUCOPHAGE Take 1 tablet (500 mg total) by mouth 2 (two) times daily with a meal.   montelukast 10 MG tablet Commonly known as: SINGULAIR Take 1 tablet (10 mg total) by mouth at bedtime.   norethindrone-ethinyl estradiol-iron 1.5-30 MG-MCG tablet Commonly known as: Larin Fe 1.5/30 TAKE 1 TABLET BY MOUTH DAILY. SKIP INACTIVE PILLS FOR TWO MONTHS,STARTING NEW PACK IMMEDIATELY, THEN TAKE FOR THIRD MONTH   promethazine 25 MG tablet Commonly known as: PHENERGAN Take 1 tablet (25 mg total) by mouth every 6 (six) hours as needed for nausea or vomiting. May cause drowsiness   rizatriptan 10 MG tablet Commonly known as: MAXALT TAKE 1 TABLET BY MOUTH AS NEEDED. MAY REPEAT IN TWO HOURS IF NEEDED   Semaglutide (1 MG/DOSE) 4 MG/3ML Sopn Inject 1 mg as directed once a week. Replaces: Semaglutide(0.25 or 0.5MG /DOS) 2 MG/3ML Sopn Started by: Elige Radon Adelae Yodice   topiramate 50 MG tablet Commonly known as: TOPAMAX Take 1 tablet (50 mg total) by mouth 2 (two) times daily.   Ubrelvy 50 MG Tabs Generic drug: Ubrogepant Take 50 mg by mouth as directed. Take at onset of headache, may repeat after 2 hours if headache still present. Max 2 tabs per day.         Objective:   BP 133/88    Pulse 88   Ht 6' (1.829 m)   Wt 272 lb (123.4 kg)   SpO2 99%   BMI 36.89 kg/m   Wt Readings from Last 3 Encounters:  08/01/23 272 lb (123.4 kg)  06/05/23 260 lb (117.9 kg)  05/01/23 274 lb (124.3 kg)    Physical Exam Vitals and nursing note reviewed.  Constitutional:      General: She is not in acute distress.    Appearance: She is well-developed. She is not diaphoretic.  Eyes:  Conjunctiva/sclera: Conjunctivae normal.  Cardiovascular:     Rate and Rhythm: Normal rate and regular rhythm.     Heart sounds: Normal heart sounds. No murmur heard. Pulmonary:     Effort: Pulmonary effort is normal. No respiratory distress.     Breath sounds: Normal breath sounds. No wheezing.  Musculoskeletal:        General: No swelling. Normal range of motion.  Skin:    General: Skin is warm and dry.     Findings: No rash.  Neurological:     Mental Status: She is alert and oriented to person, place, and time.     Coordination: Coordination normal.  Psychiatric:        Behavior: Behavior normal.       Assessment & Plan:   Problem List Items Addressed This Visit       Cardiovascular and Mediastinum   Migraine headache     Endocrine   Type 2 diabetes mellitus with other specified complication (HCC) - Primary   Relevant Medications   Semaglutide, 1 MG/DOSE, 4 MG/3ML SOPN   Other Relevant Orders   Bayer DCA Hb A1c Waived   PCOS (polycystic ovarian syndrome)     Other   Depression, recurrent (HCC)  A1c is 7.1 which is slightly better than last time at 7.2.  She has made a lot of dietary changes and seems to be doing okay with that.  Discussed with patient doing diet but then also will increase the Ozempic to 1 mg weekly.  Follow up plan: Return in about 3 months (around 11/01/2023), or if symptoms worsen or fail to improve, for Diabetes.  Counseling provided for all of the vaccine components Orders Placed This Encounter  Procedures   Bayer DCA Hb A1c Waived    Arville Care, MD [facility] 08/01/2023, 12:10 PM

## 2023-08-01 NOTE — Telephone Encounter (Signed)
New Rx sent with E11.9 to [facility]

## 2023-08-01 NOTE — Telephone Encounter (Signed)
Pharmacy called stating that pts Ozempic Rx is missing diag code. Needs DM diag code in order for insurance to cover.

## 2023-08-02 ENCOUNTER — Ambulatory Visit: Payer: Medicaid Other | Admitting: Family Medicine

## 2023-08-17 ENCOUNTER — Ambulatory Visit: Payer: Medicaid Other

## 2023-09-22 ENCOUNTER — Ambulatory Visit: Payer: Self-pay | Admitting: Family Medicine

## 2023-09-22 NOTE — Telephone Encounter (Signed)
Yes I agree she needs to be seen and evaluated since it is the end of our day, urgent care is the likely option for her.

## 2023-09-22 NOTE — Telephone Encounter (Signed)
Copied from CRM [id number]. Topic: Clinical - Red Word Triage >> Sep 22, 2023  2:32 PM Carloyn Manner C wrote: Red Word that prompted transfer to Nurse Triage: Patient called in and stated that the left lower back pain that she is in severe pain and has been happening for about 3-4 days and is concerned. She cannot bend or stand up long periods of time and it is excruciating pain to walk.   Chief Complaint: Back pain Symptoms: Left lower back pain Frequency: Constant  Pertinent Negatives: Patient denies numbness or tingling, loss of bowel or bladder control Disposition: [] ED /[x] Urgent Care (no appt availability in office) / [] Appointment(In office/virtual)/ []  [facility]/ [] Home Care/ [] Refused Recommended Disposition /[] [place] [facility]/ []  Follow-up with PCP Additional Notes: Patient reports she has been experiencing left lower back pain for the last 4-5 days stating "I think I overworked it." Patient reports her pain is improved when at rest and is exacerbated with movement. She denies any loss of bowel or bladder control, numbness/tingling, fever, or urinary symptoms. Patient has been taking Aleve without relief. I advised the patient that her office does not have any appointment until 1/3 and suggested she could follow up with Urgent Care or the ED if she needs immediate evaluation. Patient understood and has declined to make an appointment in the office. Patient given care instruction including pain medications she could use at home.    Reason for Disposition  [1] MODERATE back pain (e.g., interferes with normal activities) AND [2] present > 3 days  Answer Assessment - Initial Assessment Questions 1. ONSET: "When did the pain begin?"      5 days ago 2. LOCATION: "Where does it hurt?" (upper, mid or lower back)     Left lower back 3. SEVERITY: "How bad is the pain?"  (e.g., Scale 1-10; mild, moderate, or severe)   - MILD (1-3): Doesn't interfere with normal activities.    -  MODERATE (4-7): Interferes with normal activities or awakens from sleep.    - SEVERE (8-10): Excruciating pain, unable to do any normal activities.      7/10 4. PATTERN: "Is the pain constant?" (e.g., yes, no; constant, intermittent)      Constant   5. RADIATION: "Does the pain shoot into your legs or somewhere else?"     No radiation  6. CAUSE:  "What do you think is causing the back pain?"      Believes might have overused it.  7. BACK OVERUSE:  "Any recent lifting of heavy objects, strenuous work or exercise?"     Yes 8. MEDICINES: "What have you taken so far for the pain?" (e.g., nothing, acetaminophen, NSAIDS)     Aleve without relief  9. NEUROLOGIC SYMPTOMS: "Do you have any weakness, numbness, or problems with bowel/bladder control?"     No 10. OTHER SYMPTOMS: "Do you have any other symptoms?" (e.g., fever, abdomen pain, burning with urination, blood in urine)       No 11. PREGNANCY: "Is there any chance you are pregnant?" "When was your last menstrual period?"       No  Protocols used: Back Pain-A-AH

## 2023-11-01 ENCOUNTER — Encounter: Payer: Self-pay | Admitting: Family Medicine

## 2023-11-01 ENCOUNTER — Ambulatory Visit (INDEPENDENT_AMBULATORY_CARE_PROVIDER_SITE_OTHER): Payer: Medicaid Other | Admitting: Family Medicine

## 2023-11-01 VITALS — BP 121/68 | HR 89 | Ht 72.0 in | Wt 272.0 lb

## 2023-11-01 DIAGNOSIS — L732 Hidradenitis suppurativa: Secondary | ICD-10-CM

## 2023-11-01 DIAGNOSIS — E119 Type 2 diabetes mellitus without complications: Secondary | ICD-10-CM

## 2023-11-01 DIAGNOSIS — J309 Allergic rhinitis, unspecified: Secondary | ICD-10-CM | POA: Diagnosis not present

## 2023-11-01 DIAGNOSIS — F339 Major depressive disorder, recurrent, unspecified: Secondary | ICD-10-CM | POA: Diagnosis not present

## 2023-11-01 DIAGNOSIS — E1169 Type 2 diabetes mellitus with other specified complication: Secondary | ICD-10-CM | POA: Diagnosis not present

## 2023-11-01 DIAGNOSIS — Z7984 Long term (current) use of oral hypoglycemic drugs: Secondary | ICD-10-CM

## 2023-11-01 DIAGNOSIS — G43809 Other migraine, not intractable, without status migrainosus: Secondary | ICD-10-CM

## 2023-11-01 DIAGNOSIS — G43009 Migraine without aura, not intractable, without status migrainosus: Secondary | ICD-10-CM | POA: Diagnosis not present

## 2023-11-01 DIAGNOSIS — Z7985 Long-term (current) use of injectable non-insulin antidiabetic drugs: Secondary | ICD-10-CM

## 2023-11-01 DIAGNOSIS — G43019 Migraine without aura, intractable, without status migrainosus: Secondary | ICD-10-CM

## 2023-11-01 LAB — BAYER DCA HB A1C WAIVED: HB A1C (BAYER DCA - WAIVED): 6.5 % — ABNORMAL HIGH (ref 4.8–5.6)

## 2023-11-01 LAB — LIPID PANEL

## 2023-11-01 MED ORDER — SULFAMETHOXAZOLE-TRIMETHOPRIM 800-160 MG PO TABS
1.0000 | ORAL_TABLET | Freq: Two times a day (BID) | ORAL | 0 refills | Status: DC
Start: 2023-11-01 — End: 2024-01-31

## 2023-11-01 MED ORDER — CETIRIZINE HCL 10 MG PO TABS: 10.0000 mg | ORAL_TABLET | Freq: Every day | ORAL | 3 refills | Status: AC

## 2023-11-01 MED ORDER — MONTELUKAST SODIUM 10 MG PO TABS: 10.0000 mg | ORAL_TABLET | Freq: Every day | ORAL | 3 refills | Status: AC

## 2023-11-01 MED ORDER — TOPIRAMATE 50 MG PO TABS: 50.0000 mg | ORAL_TABLET | Freq: Two times a day (BID) | ORAL | 3 refills | Status: AC

## 2023-11-01 MED ORDER — METFORMIN HCL 500 MG PO TABS: 500.0000 mg | ORAL_TABLET | Freq: Two times a day (BID) | ORAL | 3 refills | Status: AC

## 2023-11-01 MED ORDER — DULOXETINE HCL 30 MG PO CPEP: 30.0000 mg | ORAL_CAPSULE | Freq: Every day | ORAL | 3 refills | Status: AC

## 2023-11-01 NOTE — Progress Notes (Signed)
BP 121/68   Pulse 89   Ht 6' (1.829 m)   Wt 272 lb (123.4 kg)   SpO2 97%   BMI 36.89 kg/m    Subjective:   Patient ID: Ruth King, female    DOB: 2001-02-14, 23 y.o.   MRN: [id number]  HPI: Ruth King is a 23 y.o. female presenting on 11/01/2023 for Medical Management of Chronic Issues and Diabetes   HPI Type 2 diabetes mellitus Patient comes in today for recheck of his diabetes. Patient has been currently taking Ozempic and metformin. Patient is not currently on an ACE inhibitor/ARB. Patient has not seen an ophthalmologist this year. Patient denies any new issues with their feet. The symptom started onset as an adult polycystic ovarian syndrome ARE RELATED TO DM   Migraine recheck Patient is coming in today for migraine recheck.  She currently taking topiramate and uses Maxalt as needed and she is feels like she has been doing well and not having too many of the headaches.  Patient says she is doing well with her birth control and menstrual cycle control and is not currently sexually active and has not been in the past.  Patient is also coming in today with complaints of recurrent hidradenitis in her axilla and breast, currently is mostly in her left axilla.  She did have some in her breast tissue before but it has since resolved.  Relevant past medical, surgical, family and social history reviewed and updated as indicated. Interim medical history since our last visit reviewed. Allergies and medications reviewed and updated.  Review of Systems  Constitutional:  Negative for chills and fever.  HENT:  Negative for congestion, ear discharge and ear pain.   Eyes:  Negative for redness and visual disturbance.  Respiratory:  Negative for chest tightness and shortness of breath.   Cardiovascular:  Negative for chest pain and leg swelling.  Genitourinary:  Negative for difficulty urinating and dysuria.  Musculoskeletal:  Negative for back pain and gait problem.  Skin:   Negative for rash.  Neurological:  Negative for light-headedness and headaches.  Psychiatric/Behavioral:  Negative for agitation and behavioral problems.   All other systems reviewed and are negative.   Per HPI unless specifically indicated above   Allergies as of 11/01/2023       Reactions   Bee Venom Shortness Of Breath   Penicillins Hives   Azithromycin Other (See Comments)   Not effective   Cephalosporins Hives        Medication List        Accurate as of November 01, 2023  3:48 PM. If you have any questions, ask your nurse or doctor.          albuterol 108 (90 Base) MCG/ACT inhaler Commonly known as: VENTOLIN HFA Inhale 2 puffs into the lungs every 6 (six) hours as needed for wheezing or shortness of breath.   cetirizine 10 MG tablet Commonly known as: ZYRTEC Take 1 tablet (10 mg total) by mouth at bedtime.   DULoxetine 30 MG capsule Commonly known as: CYMBALTA Take 1 capsule (30 mg total) by mouth at bedtime.   famotidine 20 MG tablet Commonly known as: Pepcid Take 1 tablet (20 mg total) by mouth 2 (two) times daily for 14 days.   fluticasone 50 MCG/ACT nasal spray Commonly known as: FLONASE Place 2 sprays into both nostrils daily.   FreeStyle Libre 3 Reader Devi 1 each by Does not apply route 2 (two) times daily.   FreeStyle  Libre 3 Sensor Misc 1 each by Does not apply route every 14 (fourteen) days. Place 1 sensor on the skin every 14 days. Use to check glucose continuously   hydrOXYzine 25 MG capsule Commonly known as: VISTARIL Take 1 capsule (25 mg total) by mouth every 8 (eight) hours as needed.   metFORMIN 500 MG tablet Commonly known as: GLUCOPHAGE Take 1 tablet (500 mg total) by mouth 2 (two) times daily with a meal.   montelukast 10 MG tablet Commonly known as: SINGULAIR Take 1 tablet (10 mg total) by mouth at bedtime.   norethindrone-ethinyl estradiol-iron 1.5-30 MG-MCG tablet Commonly known as: Larin Fe 1.5/30 TAKE 1 TABLET BY  MOUTH DAILY. SKIP INACTIVE PILLS FOR TWO MONTHS,STARTING NEW PACK IMMEDIATELY, THEN TAKE FOR THIRD MONTH   promethazine 25 MG tablet Commonly known as: PHENERGAN Take 1 tablet (25 mg total) by mouth every 6 (six) hours as needed for nausea or vomiting. May cause drowsiness   rizatriptan 10 MG tablet Commonly known as: MAXALT TAKE 1 TABLET BY MOUTH AS NEEDED. MAY REPEAT IN TWO HOURS IF NEEDED   Semaglutide (1 MG/DOSE) 4 MG/3ML Sopn Inject 1 mg as directed once a week.   sulfamethoxazole-trimethoprim 800-160 MG tablet Commonly known as: BACTRIM DS Take 1 tablet by mouth 2 (two) times daily. Started by: Elige Radon Wendolyn Raso   topiramate 50 MG tablet Commonly known as: TOPAMAX Take 1 tablet (50 mg total) by mouth 2 (two) times daily.   Ubrelvy 50 MG Tabs Generic drug: Ubrogepant Take 50 mg by mouth as directed. Take at onset of headache, may repeat after 2 hours if headache still present. Max 2 tabs per day.         Objective:   BP 121/68   Pulse 89   Ht 6' (1.829 m)   Wt 272 lb (123.4 kg)   SpO2 97%   BMI 36.89 kg/m   Wt Readings from Last 3 Encounters:  11/01/23 272 lb (123.4 kg)  08/01/23 272 lb (123.4 kg)  06/05/23 260 lb (117.9 kg)    Physical Exam Vitals and nursing note reviewed.  Constitutional:      General: She is not in acute distress.    Appearance: She is well-developed. She is not diaphoretic.  Eyes:     Conjunctiva/sclera: Conjunctivae normal.  Cardiovascular:     Rate and Rhythm: Normal rate and regular rhythm.     Heart sounds: Normal heart sounds. No murmur heard. Pulmonary:     Effort: Pulmonary effort is normal. No respiratory distress.     Breath sounds: Normal breath sounds. No wheezing.  Musculoskeletal:        General: No tenderness. Normal range of motion.  Skin:    General: Skin is warm and dry.     Findings: No rash.  Neurological:     Mental Status: She is alert and oriented to person, place, and time.     Coordination:  Coordination normal.  Psychiatric:        Behavior: Behavior normal.       Assessment & Plan:   Problem List Items Addressed This Visit       Cardiovascular and Mediastinum   Migraine headache   Relevant Medications   DULoxetine (CYMBALTA) 30 MG capsule   topiramate (TOPAMAX) 50 MG tablet     Endocrine   Type 2 diabetes mellitus with other specified complication (HCC) - Primary   Relevant Medications   metFORMIN (GLUCOPHAGE) 500 MG tablet   Other Relevant Orders  CBC with Differential/Platelet   CMP14+EGFR   Lipid panel   Bayer DCA Hb A1c Waived   Microalbumin / creatinine urine ratio     Other   Depression, recurrent (HCC)   Relevant Medications   DULoxetine (CYMBALTA) 30 MG capsule   Other Visit Diagnoses       Allergic rhinitis       Relevant Medications   cetirizine (ZYRTEC) 10 MG tablet   montelukast (SINGULAIR) 10 MG tablet     Diabetes mellitus without complication (HCC)       Relevant Medications   metFORMIN (GLUCOPHAGE) 500 MG tablet     Hidradenitis suppurativa       Relevant Medications   sulfamethoxazole-trimethoprim (BACTRIM DS) 800-160 MG tablet     Gave Bactrim to treat hidradenitis on this episode.  She will schedule back for skin lesion removal.  Continue current medicine.  Follow up plan: Return in about 3 months (around 01/30/2024), or if symptoms worsen or fail to improve, for Diabetes recheck.  Counseling provided for all of the vaccine components Orders Placed This Encounter  Procedures   CBC with Differential/Platelet   CMP14+EGFR   Lipid panel   Bayer DCA Hb A1c Waived   Microalbumin / creatinine urine ratio    Arville Care, MD [place] [facility] 11/01/2023, 3:48 PM

## 2023-11-02 LAB — CMP14+EGFR
ALT: 28 IU/L (ref 0–32)
AST: 21 IU/L (ref 0–40)
Albumin: 4.2 g/dL (ref 4.0–5.0)
Alkaline Phosphatase: 94 IU/L (ref 44–121)
BUN/Creatinine Ratio: 18 (ref 9–23)
BUN: 10 mg/dL (ref 6–20)
Bilirubin Total: 0.3 mg/dL (ref 0.0–1.2)
CO2: 24 mmol/L (ref 20–29)
Calcium: 9.3 mg/dL (ref 8.7–10.2)
Chloride: 103 mmol/L (ref 96–106)
Creatinine, Ser: 0.56 mg/dL — ABNORMAL LOW (ref 0.57–1.00)
Globulin, Total: 2.5 g/dL (ref 1.5–4.5)
Glucose: 97 mg/dL (ref 70–99)
Potassium: 4.2 mmol/L (ref 3.5–5.2)
Sodium: 140 mmol/L (ref 134–144)
Total Protein: 6.7 g/dL (ref 6.0–8.5)
eGFR: 131 mL/min/{1.73_m2} (ref >59)

## 2023-11-02 LAB — CBC WITH DIFFERENTIAL/PLATELET
Basophils Absolute: 0 10*3/uL (ref 0.0–0.2)
Basos: 0 %
EOS (ABSOLUTE): 0.2 10*3/uL (ref 0.0–0.4)
Eos: 2 %
Hematocrit: 40.2 % (ref 34.0–46.6)
Hemoglobin: 13.3 g/dL (ref 11.1–15.9)
Immature Grans (Abs): 0 10*3/uL (ref 0.0–0.1)
Immature Granulocytes: 0 %
Lymphocytes Absolute: 3.1 10*3/uL (ref 0.7–3.1)
Lymphs: 27 %
MCH: 28.5 pg (ref 26.6–33.0)
MCHC: 33.1 g/dL (ref 31.5–35.7)
MCV: 86 fL (ref 79–97)
Monocytes Absolute: 0.5 10*3/uL (ref 0.1–0.9)
Monocytes: 4 %
Neutrophils Absolute: 7.5 10*3/uL — ABNORMAL HIGH (ref 1.4–7.0)
Neutrophils: 67 %
Platelets: 377 10*3/uL (ref 150–450)
RBC: 4.66 x10E6/uL (ref 3.77–5.28)
RDW: 13.3 % (ref 11.7–15.4)
WBC: 11.3 10*3/uL — ABNORMAL HIGH (ref 3.4–10.8)

## 2023-11-02 LAB — MICROALBUMIN / CREATININE URINE RATIO
Creatinine, Urine: 90.8 mg/dL
Microalb/Creat Ratio: 38 mg/g{creat} — ABNORMAL HIGH (ref 0–29)
Microalbumin, Urine: 34.4 ug/mL

## 2023-11-02 LAB — LIPID PANEL
Cholesterol, Total: 171 mg/dL (ref 100–199)
HDL: 33 mg/dL — ABNORMAL LOW (ref >39)
LDL CALC COMMENT:: 5.2 ratio — ABNORMAL HIGH (ref 0.0–4.4)
LDL Chol Calc (NIH): 113 mg/dL — ABNORMAL HIGH (ref 0–99)
Triglycerides: 136 mg/dL (ref 0–149)
VLDL Cholesterol Cal: 25 mg/dL (ref 5–40)

## 2023-11-09 ENCOUNTER — Encounter: Payer: Self-pay | Admitting: Family Medicine

## 2023-11-22 ENCOUNTER — Ambulatory Visit: Payer: Medicaid Other | Admitting: Family Medicine

## 2023-12-05 ENCOUNTER — Other Ambulatory Visit: Payer: Self-pay | Admitting: Family Medicine

## 2023-12-05 DIAGNOSIS — R519 Headache, unspecified: Secondary | ICD-10-CM

## 2023-12-05 DIAGNOSIS — G43019 Migraine without aura, intractable, without status migrainosus: Secondary | ICD-10-CM

## 2024-01-31 ENCOUNTER — Encounter: Payer: Self-pay | Admitting: Family Medicine

## 2024-01-31 ENCOUNTER — Ambulatory Visit: Payer: Medicaid Other | Admitting: Family Medicine

## 2024-01-31 ENCOUNTER — Telehealth: Payer: Self-pay

## 2024-01-31 VITALS — BP 120/80 | HR 89 | Ht 72.0 in | Wt 266.0 lb

## 2024-01-31 DIAGNOSIS — Z7984 Long term (current) use of oral hypoglycemic drugs: Secondary | ICD-10-CM

## 2024-01-31 DIAGNOSIS — E1169 Type 2 diabetes mellitus with other specified complication: Secondary | ICD-10-CM | POA: Diagnosis not present

## 2024-01-31 DIAGNOSIS — F339 Major depressive disorder, recurrent, unspecified: Secondary | ICD-10-CM | POA: Diagnosis not present

## 2024-01-31 DIAGNOSIS — E282 Polycystic ovarian syndrome: Secondary | ICD-10-CM

## 2024-01-31 DIAGNOSIS — Z7985 Long-term (current) use of injectable non-insulin antidiabetic drugs: Secondary | ICD-10-CM

## 2024-01-31 DIAGNOSIS — N915 Oligomenorrhea, unspecified: Secondary | ICD-10-CM

## 2024-01-31 LAB — BAYER DCA HB A1C WAIVED: HB A1C (BAYER DCA - WAIVED): 6.4 % — ABNORMAL HIGH (ref 4.8–5.6)

## 2024-01-31 MED ORDER — NORETHIN ACE-ETH ESTRAD-FE 1.5-30 MG-MCG PO TABS: ORAL_TABLET | ORAL | 3 refills | Status: AC

## 2024-01-31 NOTE — Telephone Encounter (Signed)
 Pharmacy Patient Advocate Encounter   Received notification from CoverMyMeds that prior authorization for Larin Fe 1.5/30 1.5-30MG -MCG tablets is required/requested.   Insurance verification completed.   The patient is insured through [facility] .   Per test claim: PA required; PA submitted to above mentioned insurance via CoverMyMeds Key/confirmation #/EOC [id number] Status is pending

## 2024-01-31 NOTE — Progress Notes (Signed)
 BP 120/80   Pulse 89   Ht 6' (1.829 m)   Wt 266 lb (120.7 kg)   SpO2 99%   BMI 36.08 kg/m    Subjective:   Patient ID: Ruth King, female    DOB: 01/10/2001, 23 y.o.   MRN: [id number]  HPI: Ruth King is a 23 y.o. female presenting on 01/31/2024 for Medical Management of Chronic Issues and Diabetes   HPI Type 2 diabetes mellitus Patient comes in today for recheck of his diabetes. Patient has been currently taking metformin  and Ozempic . Patient is not currently on an ACE inhibitor/ARB. Patient has not seen an ophthalmologist this year. Patient denies any new issues with their feet. The symptom started onset as an adult polycystic ovarian syndrome ARE RELATED TO DM   Polycystic ovarian syndrome Patient is coming in for recheck for polycystic ovarian syndrome.  She currently takes birth control and metformin  to help with this.  She also has been working on losing weight and is down 6 pounds from where she was 3 months ago.  She feels like she is doing well and her cycles do well with the medication  Depression recheck Patient currently takes Cymbalta  for depression and mood.  She feels like her moods doing well and she is content feels like everything is going well for her.  Denies any suicidal ideations or thoughts of hurting self.    01/31/2024    1:27 PM 11/01/2023    1:17 PM 08/01/2023   11:38 AM 05/01/2023    3:06 PM 01/27/2023    1:09 PM  Depression screen PHQ 2/9  Decreased Interest 0 0 0 0 0  Down, Depressed, Hopeless 0 0 0 0 0  PHQ - 2 Score 0 0 0 0 0  Altered sleeping  0 0 0 0  Tired, decreased energy  0 0 0 0  Change in appetite  0 0 0 0  Feeling bad or failure about yourself   0 0 0 0  Trouble concentrating  0 0 0 0  Moving slowly or fidgety/restless  0 0 0 0  Suicidal thoughts  0 0 0 0  PHQ-9 Score  0 0 0 0  Difficult doing work/chores  Not difficult at all Not difficult at all Not difficult at all Not difficult at all     Relevant past medical,  surgical, family and social history reviewed and updated as indicated. Interim medical history since our last visit reviewed. Allergies and medications reviewed and updated.  Review of Systems  Constitutional:  Negative for chills and fever.  HENT:  Negative for congestion, ear discharge and ear pain.   Eyes:  Negative for redness and visual disturbance.  Respiratory:  Negative for chest tightness and shortness of breath.   Cardiovascular:  Negative for chest pain and leg swelling.  Genitourinary:  Negative for difficulty urinating and dysuria.  Musculoskeletal:  Negative for back pain and gait problem.  Skin:  Negative for rash.  Neurological:  Negative for dizziness, light-headedness and headaches.  Psychiatric/Behavioral:  Negative for agitation and behavioral problems.   All other systems reviewed and are negative.   Per HPI unless specifically indicated above   Allergies as of 01/31/2024       Reactions   Bee Venom Shortness Of Breath   Penicillins Hives   Azithromycin  Other (See Comments)   Not effective   Cephalosporins Hives        Medication List        Accurate  as of January 31, 2024  1:51 PM. If you have any questions, ask your nurse or doctor.          STOP taking these medications    sulfamethoxazole -trimethoprim  800-160 MG tablet Commonly known as: BACTRIM  DS Stopped by: Lucio Sabin Destanie Tibbetts       TAKE these medications    albuterol  108 (90 Base) MCG/ACT inhaler Commonly known as: VENTOLIN  HFA Inhale 2 puffs into the lungs every 6 (six) hours as needed for wheezing or shortness of breath.   cetirizine  10 MG tablet Commonly known as: ZYRTEC  Take 1 tablet (10 mg total) by mouth at bedtime.   DULoxetine  30 MG capsule Commonly known as: CYMBALTA  Take 1 capsule (30 mg total) by mouth at bedtime.   famotidine  20 MG tablet Commonly known as: Pepcid  Take 1 tablet (20 mg total) by mouth 2 (two) times daily for 14 days.   fluticasone  50 MCG/ACT  nasal spray Commonly known as: FLONASE  palce TWO SPRAYS into BOTH nostrils DAILY   FreeStyle Libre 3 Reader Devi 1 each by Does not apply route 2 (two) times daily.   FreeStyle Libre 3 Sensor Misc 1 each by Does not apply route every 14 (fourteen) days. Place 1 sensor on the skin every 14 days. Use to check glucose continuously   hydrOXYzine  25 MG capsule Commonly known as: VISTARIL  Take 1 capsule (25 mg total) by mouth every 8 (eight) hours as needed.   metFORMIN  500 MG tablet Commonly known as: GLUCOPHAGE  Take 1 tablet (500 mg total) by mouth 2 (two) times daily with a meal.   montelukast  10 MG tablet Commonly known as: SINGULAIR  Take 1 tablet (10 mg total) by mouth at bedtime.   norethindrone-ethinyl estradiol-iron 1.5-30 MG-MCG tablet Commonly known as: Larin Fe 1.5/30 TAKE 1 TABLET BY MOUTH DAILY. SKIP INACTIVE PILLS FOR TWO MONTHS,STARTING NEW PACK IMMEDIATELY, THEN TAKE FOR THIRD MONTH   promethazine  25 MG tablet Commonly known as: PHENERGAN  Take 1 tablet (25 mg total) by mouth every 6 (six) hours as needed for nausea or vomiting. May cause drowsiness   rizatriptan  10 MG tablet Commonly known as: MAXALT  TAKE 1 TABLET BY MOUTH AS NEEDED. MAY REPEAT IN TWO HOURS IF NEEDED   Semaglutide  (1 MG/DOSE) 4 MG/3ML Sopn Inject 1 mg as directed once a week.   topiramate  50 MG tablet Commonly known as: TOPAMAX  Take 1 tablet (50 mg total) by mouth 2 (two) times daily.   Ubrelvy  50 MG Tabs Generic drug: Ubrogepant  Take 50 mg by mouth as directed. Take at onset of headache, may repeat after 2 hours if headache still present. Max 2 tabs per day.         Objective:   BP 120/80   Pulse 89   Ht 6' (1.829 m)   Wt 266 lb (120.7 kg)   SpO2 99%   BMI 36.08 kg/m   Wt Readings from Last 3 Encounters:  01/31/24 266 lb (120.7 kg)  11/01/23 272 lb (123.4 kg)  08/01/23 272 lb (123.4 kg)    Physical Exam Vitals and nursing note reviewed.  Constitutional:      General: She  is not in acute distress.    Appearance: She is well-developed. She is not diaphoretic.  Eyes:     Conjunctiva/sclera: Conjunctivae normal.  Cardiovascular:     Rate and Rhythm: Normal rate and regular rhythm.     Heart sounds: Normal heart sounds. No murmur heard. Pulmonary:     Effort: Pulmonary effort is normal. No  respiratory distress.     Breath sounds: Normal breath sounds. No wheezing.  Musculoskeletal:        General: No swelling. Normal range of motion.  Skin:    General: Skin is warm and dry.     Findings: No rash.  Neurological:     Mental Status: She is alert and oriented to person, place, and time.     Coordination: Coordination normal.  Psychiatric:        Behavior: Behavior normal.       Assessment & Plan:   Problem List Items Addressed This Visit       Endocrine   Type 2 diabetes mellitus with other specified complication (HCC) - Primary   Relevant Orders   Bayer DCA Hb A1c Waived   PCOS (polycystic ovarian syndrome)     Other   Depression, recurrent (HCC)   Other Visit Diagnoses       Oligomenorrhea, unspecified type       Relevant Medications   norethindrone-ethinyl estradiol-iron (LARIN FE 1.5/30) 1.5-30 MG-MCG tablet     Continue current medicine, seems to be doing well.  A1c looks good at 6.4 today.  Follow up plan: Return in about 3 months (around 05/01/2024), or if symptoms worsen or fail to improve, for Diabetes PCOS.  Counseling provided for all of the vaccine components Orders Placed This Encounter  Procedures   Bayer DCA Hb A1c Waived    Jolyne Needs, MD [facility] 01/31/2024, 1:51 PM

## 2024-02-13 ENCOUNTER — Other Ambulatory Visit (HOSPITAL_COMMUNITY): Payer: Self-pay

## 2024-02-13 ENCOUNTER — Telehealth: Payer: Self-pay

## 2024-02-13 NOTE — Telephone Encounter (Signed)
Attempted to contact patient. VM full.

## 2024-02-13 NOTE — Telephone Encounter (Signed)
 Pharmacy Patient Advocate Encounter  Received notification from [employer] that Prior Authorization for Ozempic  1 has been APPROVED from 02/13/24 to 02/12/25. Ran test claim, Copay is $4.00. This test claim was processed through [facility]- copay amounts may vary at other pharmacies due to pharmacy/plan contracts, or as the patient moves through the different stages of their insurance plan.   PA #/Case ID/Reference #: Samantha Cress

## 2024-02-13 NOTE — Telephone Encounter (Signed)
 Pharmacy Patient Advocate Encounter   Received notification from Patient Pharmacy that prior authorization for Ozempic  1 is required/requested.   Insurance verification completed.   The patient is insured through [employer] .   Per test claim: PA required; PA submitted to above mentioned insurance via CoverMyMeds Key/confirmation #/EOC [id number] Status is pending

## 2024-02-15 NOTE — Telephone Encounter (Signed)
Attempted to contact patient. VM full.

## 2024-02-16 NOTE — Telephone Encounter (Signed)
 Unable to lm on vm. 3rd attempt. Sent Mychart message regarding approval. LS

## 2024-02-21 NOTE — H&P (Signed)
  Patient: Ruth King  PID: [id number]  DOB: [birth year]  SEX: Female   Patient referred by DDS for extraction teeth 1, 3, 14, 16, 17, 32. ,   CC: No pain.  Past Medical History:  Asthma, Diabetes type 1, Obese    Medications: Cetirizine , Duloxetine , Metformin , Montelukast , Ondansetron , Ozempic , topiramate     Allergies:     Cefzil, bees, Penicillin    Surgeries:   Appendectomy, gall bladder removal                              Exam: BMI 35. Erupted 1, 16, 17, 32, Tooth # 3 gross decay with exposed roots. Tooth # 14 no decay noted. No percussion tenderness.   No purulence, edema, fluctuance, trismus. Oral cancer screening negative. Pharynx clear. No lymphadenopathy.  Panorex:Erupted 1, 16, 17, 32, Decay # 1, 3, 16, 17, occlusal decay # 14.   Assessment: ASA 3. Non-restorable  teeth # 1, 3, 16, 17, 32.              Plan: Extraction Teeth # 1, 3, 16, 17, 32.           Hospital Day surgery.                 Rx: n               Risks and complications explained. Questions answered.   Cornelia Dieter, DMD

## 2024-02-22 ENCOUNTER — Encounter (HOSPITAL_COMMUNITY): Payer: Self-pay | Admitting: Oral Surgery

## 2024-02-22 NOTE — Progress Notes (Signed)
 Unable to contact patient or leave a voicemail on her phone as her mailbox was full. Left a message on her mother's,Peggy Bonanno,phone instructing her to hold Semaglutide  until after surgery. Told her someone would call with instructions for surgery tomorrow.

## 2024-02-23 ENCOUNTER — Other Ambulatory Visit: Payer: Self-pay

## 2024-02-23 ENCOUNTER — Encounter (HOSPITAL_COMMUNITY): Payer: Self-pay | Admitting: Oral Surgery

## 2024-02-23 NOTE — Progress Notes (Signed)
 SDW call  Patient was given pre-op instructions over the phone. Patient verbalized understanding of instructions provided.     PCP - Dr. Melodie Spry Dettinger Cardiologist -  Pulmonary:    PPM/ICD - denies Device Orders - na Rep Notified - na   Chest x-ray - denies EKG -  DOS, 02/27/2024 Stress Test - ECHO -  Cardiac Cath -   Sleep Study/sleep apnea/CPAP: denies  Type II diabetes. She is no longer using the Libre 3 Fasting Blood sugar range: 130-140 How often check sugars: daily Metformin , instructed to hold DOS  GLP1: Semaglutide , states last dose 02/16/2024   Blood Thinner Instructions: denies Aspirin Instructions:denies   ERAS Protcol - NPO  Anesthesia review: Yes, DM, asthma, on GLP1,    Patient denies shortness of breath, fever, cough and chest pain over the phone call  Your procedure is scheduled on Tuesday Feb 27, 2024  Report to [facility] Main Entrance "A" at 0930  A.M., then check in with the Admitting office.  Call this number if you have problems the morning of surgery:  [phone]   If you have any questions prior to your surgery date call [phone]: Open Monday-Friday 8am-4pm If you experience any cold or flu symptoms such as cough, fever, chills, shortness of breath, etc. between now and your scheduled surgery, please notify us  at the above number    Remember:  Do not eat or drink after midnight the night before your surgery  Take these medicines the morning of surgery with A SIP OF WATER:  Flonase , topamax   As needed: Albuterol , vistaril   As of today, STOP taking any Aspirin (unless otherwise instructed by your surgeon) Aleve , Naproxen , Ibuprofen , Motrin , Advil , Goody's, BC's, all herbal medications, fish oil, and all vitamins.

## 2024-02-27 ENCOUNTER — Ambulatory Visit (HOSPITAL_BASED_OUTPATIENT_CLINIC_OR_DEPARTMENT_OTHER): Payer: Self-pay | Admitting: Physician Assistant

## 2024-02-27 ENCOUNTER — Encounter (HOSPITAL_COMMUNITY): Payer: Self-pay | Admitting: Oral Surgery

## 2024-02-27 ENCOUNTER — Other Ambulatory Visit: Payer: Self-pay

## 2024-02-27 ENCOUNTER — Ambulatory Visit (HOSPITAL_COMMUNITY)
Admission: RE | Admit: 2024-02-27 | Discharge: 2024-02-27 | Disposition: A | Discharge status: Home / Self Care | Attending: Oral Surgery | Admitting: Oral Surgery

## 2024-02-27 ENCOUNTER — Encounter (HOSPITAL_COMMUNITY)
Admission: RE | Disposition: A | Discharge status: Home / Self Care | Payer: Self-pay | Source: Home / Self Care | Attending: Oral Surgery

## 2024-02-27 ENCOUNTER — Ambulatory Visit (HOSPITAL_COMMUNITY): Payer: Self-pay | Admitting: Physician Assistant

## 2024-02-27 DIAGNOSIS — Z6835 Body mass index (BMI) 35.0-35.9, adult: Secondary | ICD-10-CM | POA: Diagnosis not present

## 2024-02-27 DIAGNOSIS — E669 Obesity, unspecified: Secondary | ICD-10-CM | POA: Diagnosis not present

## 2024-02-27 DIAGNOSIS — E119 Type 2 diabetes mellitus without complications: Secondary | ICD-10-CM | POA: Diagnosis not present

## 2024-02-27 DIAGNOSIS — K029 Dental caries, unspecified: Secondary | ICD-10-CM | POA: Insufficient documentation

## 2024-02-27 DIAGNOSIS — Z7985 Long-term (current) use of injectable non-insulin antidiabetic drugs: Secondary | ICD-10-CM | POA: Diagnosis not present

## 2024-02-27 DIAGNOSIS — Z7984 Long term (current) use of oral hypoglycemic drugs: Secondary | ICD-10-CM | POA: Insufficient documentation

## 2024-02-27 HISTORY — DX: Anxiety disorder, unspecified: F41.9

## 2024-02-27 HISTORY — PX: TOOTH EXTRACTION: SHX859

## 2024-02-27 HISTORY — DX: Depression, unspecified: F32.A

## 2024-02-27 HISTORY — DX: Type 2 diabetes mellitus without complications: E11.9

## 2024-02-27 LAB — CBC
HCT: 40.8 % (ref 36.0–46.0)
Hemoglobin: 12.8 g/dL (ref 12.0–15.0)
MCH: 27.9 pg (ref 26.0–34.0)
MCHC: 31.4 g/dL (ref 30.0–36.0)
MCV: 89.1 fL (ref 80.0–100.0)
Platelets: 377 10*3/uL (ref 150–400)
RBC: 4.58 MIL/uL (ref 3.87–5.11)
RDW: 13.2 % (ref 11.5–15.5)
WBC: 11.7 10*3/uL — ABNORMAL HIGH (ref 4.0–10.5)
nRBC: 0 % (ref 0.0–0.2)

## 2024-02-27 LAB — BASIC METABOLIC PANEL WITH GFR
Anion gap: 9 (ref 5–15)
BUN: 13 mg/dL (ref 6–20)
CO2: 24 mmol/L (ref 22–32)
Calcium: 9.4 mg/dL (ref 8.9–10.3)
Chloride: 107 mmol/L (ref 98–111)
Creatinine, Ser: 0.58 mg/dL (ref 0.44–1.00)
GFR, Estimated: >60 mL/min (ref >60)
Glucose, Bld: 107 mg/dL — ABNORMAL HIGH (ref 70–99)
Potassium: 4 mmol/L (ref 3.5–5.1)
Sodium: 140 mmol/L (ref 135–145)

## 2024-02-27 LAB — GLUCOSE, CAPILLARY
Glucose-Capillary: 110 mg/dL — ABNORMAL HIGH (ref 70–99)
Glucose-Capillary: 153 mg/dL — ABNORMAL HIGH (ref 70–99)

## 2024-02-27 LAB — POCT PREGNANCY, URINE: Preg Test, Ur: NEGATIVE

## 2024-02-27 SURGERY — DENTAL RESTORATION/EXTRACTIONS
Anesthesia: General | Site: Mouth

## 2024-02-27 MED ORDER — ONDANSETRON HCL 4 MG/2ML IJ SOLN
INTRAMUSCULAR | Status: AC
  Filled 2024-02-27: qty 2

## 2024-02-27 MED ORDER — CLINDAMYCIN PHOSPHATE 600 MG/50ML IV SOLN
600.0000 mg | INTRAVENOUS | Status: AC
  Administered 2024-02-27: 600 mg via INTRAVENOUS
  Filled 2024-02-27: qty 50

## 2024-02-27 MED ORDER — FENTANYL CITRATE (PF) 250 MCG/5ML IJ SOLN
INTRAMUSCULAR | Status: DC | PRN
  Administered 2024-02-27: 100 ug via INTRAVENOUS
  Administered 2024-02-27: 50 ug via INTRAVENOUS

## 2024-02-27 MED ORDER — PROPOFOL 10 MG/ML IV BOLUS
INTRAVENOUS | Status: DC | PRN
  Administered 2024-02-27: 200 mg via INTRAVENOUS

## 2024-02-27 MED ORDER — 0.9 % SODIUM CHLORIDE (POUR BTL) OPTIME
TOPICAL | Status: DC | PRN
  Administered 2024-02-27: 1000 mL

## 2024-02-27 MED ORDER — ORAL CARE MOUTH RINSE: 15.0000 mL | Freq: Once | OROMUCOSAL | Status: AC

## 2024-02-27 MED ORDER — SODIUM CHLORIDE 0.9 % IR SOLN
Status: DC | PRN
  Administered 2024-02-27: 1000 mL

## 2024-02-27 MED ORDER — LIDOCAINE-EPINEPHRINE 2 %-1:100000 IJ SOLN
INTRAMUSCULAR | Status: DC | PRN
  Administered 2024-02-27: 14 mL via INTRADERMAL

## 2024-02-27 MED ORDER — SUGAMMADEX SODIUM 200 MG/2ML IV SOLN
INTRAVENOUS | Status: DC | PRN
  Administered 2024-02-27: 400 mg via INTRAVENOUS

## 2024-02-27 MED ORDER — DEXAMETHASONE SODIUM PHOSPHATE 10 MG/ML IJ SOLN
INTRAMUSCULAR | Status: DC | PRN
  Administered 2024-02-27: 10 mg via INTRAVENOUS

## 2024-02-27 MED ORDER — ROCURONIUM BROMIDE 10 MG/ML (PF) SYRINGE
PREFILLED_SYRINGE | INTRAVENOUS | Status: AC
Start: 2024-02-27 — End: ?
  Filled 2024-02-27: qty 10

## 2024-02-27 MED ORDER — OXYMETAZOLINE HCL 0.05 % NA SOLN
NASAL | Status: DC | PRN
  Administered 2024-02-27: 1 via TOPICAL

## 2024-02-27 MED ORDER — ROCURONIUM BROMIDE 10 MG/ML (PF) SYRINGE
PREFILLED_SYRINGE | INTRAVENOUS | Status: DC | PRN
  Administered 2024-02-27: 80 mg via INTRAVENOUS

## 2024-02-27 MED ORDER — HYDROCODONE-ACETAMINOPHEN 5-325 MG PO TABS: 1.0000 | ORAL_TABLET | ORAL | 0 refills | Status: AC | PRN

## 2024-02-27 MED ORDER — LIDOCAINE 2% (20 MG/ML) 5 ML SYRINGE
INTRAMUSCULAR | Status: AC
  Filled 2024-02-27: qty 5

## 2024-02-27 MED ORDER — LIDOCAINE 2% (20 MG/ML) 5 ML SYRINGE
INTRAMUSCULAR | Status: DC | PRN
  Administered 2024-02-27: 100 mg via INTRAVENOUS

## 2024-02-27 MED ORDER — CHLORHEXIDINE GLUCONATE 0.12 % MT SOLN
15.0000 mL | Freq: Once | OROMUCOSAL | Status: AC
  Administered 2024-02-27: 15 mL via OROMUCOSAL
  Filled 2024-02-27: qty 15

## 2024-02-27 MED ORDER — MIDAZOLAM HCL 2 MG/2ML IJ SOLN
INTRAMUSCULAR | Status: DC | PRN
  Administered 2024-02-27: 2 mg via INTRAVENOUS

## 2024-02-27 MED ORDER — FENTANYL CITRATE (PF) 250 MCG/5ML IJ SOLN
INTRAMUSCULAR | Status: AC
  Filled 2024-02-27: qty 5

## 2024-02-27 MED ORDER — MIDAZOLAM HCL 2 MG/2ML IJ SOLN
INTRAMUSCULAR | Status: AC
Start: 2024-02-27 — End: ?
  Filled 2024-02-27: qty 2

## 2024-02-27 MED ORDER — OXYMETAZOLINE HCL 0.05 % NA SOLN
NASAL | Status: AC
  Filled 2024-02-27: qty 30

## 2024-02-27 MED ORDER — ONDANSETRON HCL 4 MG/2ML IJ SOLN
INTRAMUSCULAR | Status: DC | PRN
  Administered 2024-02-27: 4 mg via INTRAVENOUS

## 2024-02-27 MED ORDER — LACTATED RINGERS IV SOLN: INTRAVENOUS | Status: DC

## 2024-02-27 MED ORDER — DEXAMETHASONE SODIUM PHOSPHATE 10 MG/ML IJ SOLN
INTRAMUSCULAR | Status: AC
  Filled 2024-02-27: qty 1

## 2024-02-27 MED ORDER — DEXMEDETOMIDINE HCL IN NACL 80 MCG/20ML IV SOLN
INTRAVENOUS | Status: DC | PRN
  Administered 2024-02-27: 4 ug via INTRAVENOUS
  Administered 2024-02-27: 8 ug via INTRAVENOUS

## 2024-02-27 MED ORDER — PROPOFOL 10 MG/ML IV BOLUS
INTRAVENOUS | Status: AC
  Filled 2024-02-27: qty 20

## 2024-02-27 SURGICAL SUPPLY — 26 items
BAG COUNTER SPONGE SURGICOUNT (BAG) IMPLANT
BLADE SURG 15 STRL LF DISP TIS (BLADE) ×1 IMPLANT
BUR CROSS CUT FISSURE 1.6 (BURR) ×1 IMPLANT
BUR EGG ELITE 4.0 (BURR) IMPLANT
CANISTER SUCTION 3000ML PPV (SUCTIONS) ×1 IMPLANT
COVER SURGICAL LIGHT HANDLE (MISCELLANEOUS) ×1 IMPLANT
GAUZE PACKING FOLDED 2 STR (GAUZE/BANDAGES/DRESSINGS) ×1 IMPLANT
GLOVE BIO SURGEON STRL SZ8 (GLOVE) ×1 IMPLANT
GOWN STRL REUS W/ TWL LRG LVL3 (GOWN DISPOSABLE) ×1 IMPLANT
GOWN STRL REUS W/ TWL XL LVL3 (GOWN DISPOSABLE) ×1 IMPLANT
IV NS 1000ML BAXH (IV SOLUTION) ×1 IMPLANT
KIT BASIN OR (CUSTOM PROCEDURE TRAY) ×1 IMPLANT
KIT TURNOVER KIT B (KITS) ×1 IMPLANT
NDL HYPO 25GX1X1/2 BEV (NEEDLE) ×2 IMPLANT
NEEDLE HYPO 25GX1X1/2 BEV (NEEDLE) ×2 IMPLANT
NS IRRIG 1000ML POUR BTL (IV SOLUTION) ×1 IMPLANT
PAD ARMBOARD POSITIONER FOAM (MISCELLANEOUS) ×1 IMPLANT
SLEEVE IRRIGATION ELITE 7 (MISCELLANEOUS) ×1 IMPLANT
SPIKE FLUID TRANSFER (MISCELLANEOUS) IMPLANT
SUT CHROMIC 3 0 SH 27 (SUTURE) IMPLANT
SUT PLAIN 3 0 PS2 27 (SUTURE) IMPLANT
SYR BULB IRRIG 60ML STRL (SYRINGE) ×1 IMPLANT
SYR CONTROL 10ML LL (SYRINGE) ×1 IMPLANT
TRAY ENT MC OR (CUSTOM PROCEDURE TRAY) ×1 IMPLANT
TUBING IRRIGATION (MISCELLANEOUS) ×1 IMPLANT
YANKAUER SUCT BULB TIP NO VENT (SUCTIONS) ×1 IMPLANT

## 2024-02-27 NOTE — H&P (Signed)
 H&P documentation  -History and Physical Reviewed  -Patient has been re-examined  -No change in the plan of care  Ruth King

## 2024-02-27 NOTE — H&P (Signed)
 Anesthesia H&P Update: History and Physical Exam reviewed; patient is OK for planned anesthetic and procedure. ? ?

## 2024-02-27 NOTE — Op Note (Signed)
 02/27/2024  1:13 PM  PATIENT:  Ruth King  23 y.o. female  PRE-OPERATIVE DIAGNOSIS:  Non-restorable teeth # 1, 3, 16, 17, 32 secondary to dental caries  POST-OPERATIVE DIAGNOSIS:  SAME  PROCEDURE:  Procedure(s): EXTRACTIONS teeth # 1, 3, 16, 17, 32   SURGEON:  Surgeon(s): Ascencion Lava, DMD  ANESTHESIA:   local and general  EBL:  minimal  DRAINS: none   SPECIMEN:  No Specimen  COUNTS:  YES  PLAN OF CARE: Discharge to home after PACU  PATIENT DISPOSITION:  PACU - hemodynamically stable.   PROCEDURE DETAILS: Dictation #[id number]  Cornelia Dieter, DMD 02/27/2024 1:13 PM

## 2024-02-27 NOTE — Op Note (Unsigned)
 NAME: Ruth King, Ruth King MEDICAL RECORD NO: [id number] ACCOUNT NO: [id number] DATE OF BIRTH: Dec 06, 2000 FACILITY: [facility] LOCATION: [facility] PHYSICIAN: Cornelia Dieter, DDS  Operative Report   DATE OF PROCEDURE: 02/27/2024  PREOPERATIVE DIAGNOSES:  Nonrestorable teeth secondary to dental caries numbers 1, 3, 16, 17, and 32.  POSTOPERATIVE DIAGNOSES:  Nonrestorable teeth secondary to dental caries numbers 1, 3, 16, 17, and 32.  PROCEDURE:  Extraction of teeth numbers 1, 3, 16, 17, and 32.  SURGEON:  Cornelia Dieter, DDS  ANESTHESIA:  General. Dr. Yvonnie Heritage attending. Nasal intubation.  DESCRIPTION OF PROCEDURE:  The patient was taken to the operating room and placed on the table in the supine position.  General anesthesia was administered and a nasal endotracheal tube was placed.  The eyes were protected.  The tube was secured and the  patient was draped for surgery.  A timeout was performed.  The posterior pharynx was suctioned and a throat pack was placed.  2% lidocaine  with 1:100,000 epinephrine was infiltrated in an inferior alveolar block on the right and left sides and then  buccal and palatal infiltration of the maxilla around the upper teeth to be removed.  A bite block was placed on the right side of the mouth.  The left side was operated first.  A #15 blade was used to make an incision in the gingival sulcus around teeth  numbers 16 and 17.  The periosteum was reflected from around the teeth.  The teeth were elevated with a 301 elevator and removed from the mouth with the dental forceps.  The sockets were curetted, irrigated, and closed with 3-0 chromic.  Attention was  then turned to the right side of the mouth.  A 15 blade was used to make an incision in the gingival sulcus around teeth numbers 1, 3, and 32.  The periosteum was reflected.  The teeth were elevated with a 301 elevator and removed from the mouth with the  dental forceps.  The sockets were curetted, irrigated, and closed with  3-0 chromic.  The oral cavity was then irrigated and suctioned.  The throat pack was removed.  The patient was left under the care of anesthesia for extubation and transport to  recovery with plans for discharge home through day surgery.  ESTIMATED BLOOD LOSS:  Minimum.  COMPLICATIONS:  None.  SPECIMENS:  None.   PUS D: 02/27/2024 1:16:23 pm T: 02/27/2024 3:49:00 pm  JOB: [id number]/ [id number]

## 2024-02-27 NOTE — Anesthesia Procedure Notes (Signed)
 Procedure Name: Intubation Date/Time: 02/27/2024 12:46 PM  Performed by: Ancel Baltimore, RNPre-anesthesia Checklist: Patient identified, Emergency Drugs available, Suction available and Patient being monitored Patient Re-evaluated:Patient Re-evaluated prior to induction Oxygen  Delivery Method: Circle System Utilized Preoxygenation: Pre-oxygenation with 100% oxygen  Induction Type: IV induction Ventilation: Two handed mask ventilation required and Oral airway inserted - appropriate to patient size Laryngoscope Size: Mac and 4 Grade View: Grade I Tube type: Oral Nasal Tubes: Right, Nasal prep performed, Nasal Rae and Magill forceps- large, utilized Tube size: 7.0 mm Number of attempts: 1 Airway Equipment and Method: Stylet and Oral airway Placement Confirmation: ETT inserted through vocal cords under direct vision, positive ETCO2 and breath sounds checked- equal and bilateral Secured at: 25 cm Tube secured with: Tape Dental Injury: Teeth and Oropharynx as per pre-operative assessment  Comments: Preformed by SRNA, MDA and CRNA present

## 2024-02-27 NOTE — Transfer of Care (Signed)
 Immediate Anesthesia Transfer of Care Note  Patient: Ruth King  Procedure(s) Performed: DENTAL RESTORATION/EXTRACTIONS (Mouth)  Patient Location: PACU  Anesthesia Type:General  Level of Consciousness: awake and drowsy  Airway & Oxygen  Therapy: Patient Spontanous Breathing, Patient connected to face mask oxygen , and Patient connected to face mask  Post-op Assessment: Report given to RN and Post -op Vital signs reviewed and stable  Post vital signs: Reviewed and stable  Last Vitals:  Vitals Value Taken Time  BP 136/83 02/27/24 1324  Temp 37.4 C 02/27/24 1324  Pulse 86 02/27/24 1327  Resp 24 02/27/24 1327  SpO2 88 % 02/27/24 1327  Vitals shown include unfiled device data.  Last Pain:  Vitals:   02/27/24 0951  TempSrc: Oral  PainSc:       Patients Stated Pain Goal: 0 (02/27/24 0950)  Complications: No notable events documented.

## 2024-02-27 NOTE — Anesthesia Postprocedure Evaluation (Signed)
 Anesthesia Post Note  Patient: Ruth King  Procedure(s) Performed: DENTAL RESTORATION/EXTRACTIONS (Mouth)     Patient location during evaluation: PACU Anesthesia Type: General Level of consciousness: awake and alert and oriented Pain management: pain level controlled Vital Signs Assessment: post-procedure vital signs reviewed and stable Respiratory status: spontaneous breathing, nonlabored ventilation and respiratory function stable Cardiovascular status: blood pressure returned to baseline and stable Postop Assessment: no apparent nausea or vomiting Anesthetic complications: no   No notable events documented.  Last Vitals:  Vitals:   02/27/24 1445 02/27/24 1515  BP: 139/82 (!) 136/92  Pulse: 81 91  Resp: 17 (!) 30  Temp:  37.2 C  SpO2: 92% 93%    Last Pain:  Vitals:   02/27/24 1515  TempSrc:   PainSc: 0-No pain                 Montae Stager A.

## 2024-02-27 NOTE — Anesthesia Preprocedure Evaluation (Addendum)
 Anesthesia Evaluation  Patient identified by MRN, date of birth, ID band Patient awake    Reviewed: Allergy & Precautions, NPO status , Patient's Chart, lab work & pertinent test results  Airway Mallampati: II  TM Distance: >3 FB     Dental  (+) Dental Advisory Given, Poor Dentition   Pulmonary asthma    Pulmonary exam normal breath sounds clear to auscultation       Cardiovascular negative cardio ROS Normal cardiovascular exam Rhythm:Regular Rate:Normal     Neuro/Psych  Headaches PSYCHIATRIC DISORDERS Anxiety Depression       GI/Hepatic Neg liver ROS,,,Dental caries   Endo/Other  diabetes, Well Controlled, Type 2  Obesity GLP-1 RA therapy- last dose 5/16   Renal/GU negative Renal ROS  negative genitourinary   Musculoskeletal negative musculoskeletal ROS (+)    Abdominal   Peds  Hematology negative hematology ROS (+)   Anesthesia Other Findings   Reproductive/Obstetrics                              Anesthesia Physical Anesthesia Plan  ASA: 2  Anesthesia Plan: General   Post-op Pain Management: Minimal or no pain anticipated   Induction: Intravenous  PONV Risk Score and Plan: 3 and Treatment may vary due to age or medical condition, Midazolam , Ondansetron  and Dexamethasone   Airway Management Planned: Nasal ETT  Additional Equipment: None  Intra-op Plan:   Post-operative Plan: Extubation in OR  Informed Consent: I have reviewed the patients History and Physical, chart, labs and discussed the procedure including the risks, benefits and alternatives for the proposed anesthesia with the patient or authorized representative who has indicated his/her understanding and acceptance.     Dental advisory given  Plan Discussed with: Anesthesiologist and CRNA  Anesthesia Plan Comments:        Anesthesia Quick Evaluation

## 2024-02-28 ENCOUNTER — Encounter (HOSPITAL_COMMUNITY): Payer: Self-pay | Admitting: Oral Surgery

## 2024-03-04 ENCOUNTER — Encounter: Payer: Self-pay | Admitting: Family Medicine

## 2024-03-04 ENCOUNTER — Ambulatory Visit: Admitting: Family Medicine

## 2024-03-04 VITALS — BP 114/82 | HR 89 | Temp 97.4°F | Ht 72.0 in | Wt 266.4 lb

## 2024-03-04 DIAGNOSIS — L918 Other hypertrophic disorders of the skin: Secondary | ICD-10-CM

## 2024-03-04 NOTE — Progress Notes (Signed)
 BP 114/82   Pulse 89   Temp (!) 97.4 F (36.3 C)   Ht 6' (1.829 m)   Wt 266 lb 6.4 oz (120.8 kg)   LMP 02/01/2024 (Approximate)   SpO2 98%   BMI 36.13 kg/m    Subjective:   Patient ID: Ruth King, female    DOB: June 27, 2001, 23 y.o.   MRN: [id number]  HPI: Ruth King is a 23 y.o. female presenting on 03/04/2024 for Skin Tag (Skin tag under right arm.)   HPI Skin tag removal right axilla Patient has a skin tag that irritates her under her right axilla and catches when she shaves and on her deodorant and bleeds sometimes.  She wants it removed  Relevant past medical, surgical, family and social history reviewed and updated as indicated. Interim medical history since our last visit reviewed. Allergies and medications reviewed and updated.  Review of Systems  Constitutional:  Negative for activity change, chills and fever.    Per HPI unless specifically indicated above   Allergies as of 03/04/2024       Reactions   Bee Venom Shortness Of Breath   Penicillins Hives   Azithromycin  Other (See Comments)   Not effective   Cephalosporins Hives        Medication List        Accurate as of March 04, 2024  2:56 PM. If you have any questions, ask your nurse or doctor.          albuterol  108 (90 Base) MCG/ACT inhaler Commonly known as: VENTOLIN  HFA Inhale 2 puffs into the lungs every 6 (six) hours as needed for wheezing or shortness of breath.   cetirizine  10 MG tablet Commonly known as: ZYRTEC  Take 1 tablet (10 mg total) by mouth at bedtime.   DULoxetine  30 MG capsule Commonly known as: CYMBALTA  Take 1 capsule (30 mg total) by mouth at bedtime.   famotidine  20 MG tablet Commonly known as: Pepcid  Take 1 tablet (20 mg total) by mouth 2 (two) times daily for 14 days. What changed: when to take this   fluticasone  50 MCG/ACT nasal spray Commonly known as: FLONASE  palce TWO SPRAYS into BOTH nostrils DAILY   hydrOXYzine  25 MG capsule Commonly known as:  VISTARIL  Take 1 capsule (25 mg total) by mouth every 8 (eight) hours as needed.   metFORMIN  500 MG tablet Commonly known as: GLUCOPHAGE  Take 1 tablet (500 mg total) by mouth 2 (two) times daily with a meal.   montelukast  10 MG tablet Commonly known as: SINGULAIR  Take 1 tablet (10 mg total) by mouth at bedtime.   norethindrone-ethinyl estradiol-iron 1.5-30 MG-MCG tablet Commonly known as: Larin Fe 1.5/30 TAKE 1 TABLET BY MOUTH DAILY. SKIP INACTIVE PILLS FOR TWO MONTHS,STARTING NEW PACK IMMEDIATELY, THEN TAKE FOR THIRD MONTH   promethazine  25 MG tablet Commonly known as: PHENERGAN  Take 1 tablet (25 mg total) by mouth every 6 (six) hours as needed for nausea or vomiting. May cause drowsiness   rizatriptan  10 MG tablet Commonly known as: MAXALT  TAKE 1 TABLET BY MOUTH AS NEEDED. MAY REPEAT IN TWO HOURS IF NEEDED   Semaglutide  (1 MG/DOSE) 4 MG/3ML Sopn Inject 1 mg as directed once a week.   topiramate  50 MG tablet Commonly known as: TOPAMAX  Take 1 tablet (50 mg total) by mouth 2 (two) times daily.   Ubrelvy  50 MG Tabs Generic drug: Ubrogepant  Take 50 mg by mouth as directed. Take at onset of headache, may repeat after 2 hours if headache  still present. Max 2 tabs per day.         Objective:   BP 114/82   Pulse 89   Temp (!) 97.4 F (36.3 C)   Ht 6' (1.829 m)   Wt 266 lb 6.4 oz (120.8 kg)   LMP 02/01/2024 (Approximate)   SpO2 98%   BMI 36.13 kg/m   Wt Readings from Last 3 Encounters:  03/04/24 266 lb 6.4 oz (120.8 kg)  02/27/24 260 lb (117.9 kg)  01/31/24 266 lb (120.7 kg)    Physical Exam Vitals and nursing note reviewed.  Constitutional:      Appearance: Normal appearance.  Skin:    Comments: Right axillary skin tag 0.2 cm in size  Neurological:     Mental Status: She is alert.     Skin tag removal: Using forceps and derma blade excised 1 skin tag. Used silver nitrate sticks for hemostasis and pressure dressing with topical antibiotic. Patient tolerated  well and had minimal bleeding.   Assessment & Plan:   Problem List Items Addressed This Visit   None Visit Diagnoses       Skin tag    -  Primary       Right axilla skin tag, removed Follow up plan: Return if symptoms worsen or fail to improve.  Counseling provided for all of the vaccine components No orders of the defined types were placed in this encounter.   Jolyne Needs, MD [facility] 03/04/2024, 2:56 PM

## 2024-03-20 NOTE — Telephone Encounter (Signed)
 Pharmacy Patient Advocate Encounter  Received notification from [facility] that Prior Authorization for Larin Fe 1.5/30 1.5-30MG -MCG tablets has been DENIED.  See denial reason below. No denial letter attached in CMM. Will attach denial letter to Media tab once received.   PA #/Case ID/Reference #: [id number]

## 2024-05-01 ENCOUNTER — Encounter: Payer: Self-pay | Admitting: Family Medicine

## 2024-05-01 ENCOUNTER — Ambulatory Visit: Admitting: Family Medicine

## 2024-05-01 VITALS — BP 127/81 | HR 83 | Temp 98.2°F | Ht 72.0 in | Wt 279.0 lb

## 2024-05-01 DIAGNOSIS — E1169 Type 2 diabetes mellitus with other specified complication: Secondary | ICD-10-CM

## 2024-05-01 DIAGNOSIS — G43809 Other migraine, not intractable, without status migrainosus: Secondary | ICD-10-CM

## 2024-05-01 DIAGNOSIS — G43009 Migraine without aura, not intractable, without status migrainosus: Secondary | ICD-10-CM

## 2024-05-01 DIAGNOSIS — Z7985 Long-term (current) use of injectable non-insulin antidiabetic drugs: Secondary | ICD-10-CM

## 2024-05-01 DIAGNOSIS — Z7984 Long term (current) use of oral hypoglycemic drugs: Secondary | ICD-10-CM | POA: Diagnosis not present

## 2024-05-01 DIAGNOSIS — F339 Major depressive disorder, recurrent, unspecified: Secondary | ICD-10-CM | POA: Diagnosis not present

## 2024-05-01 LAB — BAYER DCA HB A1C WAIVED: HB A1C (BAYER DCA - WAIVED): 7.2 % — ABNORMAL HIGH (ref 4.8–5.6)

## 2024-05-01 LAB — LIPID PANEL

## 2024-05-01 MED ORDER — SEMAGLUTIDE (1 MG/DOSE) 4 MG/3ML ~~LOC~~ SOPN: 1.0000 mg | PEN_INJECTOR | SUBCUTANEOUS | 3 refills | Status: AC

## 2024-05-01 MED ORDER — RIZATRIPTAN BENZOATE 10 MG PO TABS: ORAL_TABLET | ORAL | 3 refills | Status: AC

## 2024-05-01 NOTE — Progress Notes (Signed)
 BP 127/81   Pulse 83   Temp 98.2 F (36.8 C) (Temporal)   Ht 6' (1.829 m)   Wt 279 lb (126.6 kg)   SpO2 96%   BMI 37.84 kg/m    Subjective:   Patient ID: Ruth King, female    DOB: August 17, 2001, 23 y.o.   MRN: [id number]  HPI: Ruth King is a 23 y.o. female presenting on 05/01/2024 for Medical Management of Chronic Issues   HPI Type 2 diabetes mellitus Patient comes in today for recheck of his diabetes. Patient has been currently taking metformin  and Ozempic . Patient is not currently on an ACE inhibitor/ARB. Patient has not seen an ophthalmologist this year. Patient denies any new issues with their feet. The symptom started onset as an adult migraines and PCOS ARE RELATED TO DM   Migraine recheck Patient is coming in for migraine recheck.  She says she has only had 1 migraine in the past month and she thinks it was due to overheating because of the summer.  Other than that she has been doing really well.  Uses Maxalt  and Topamax  and Cymbalta   Depression and anxiety recheck Patient feels like she is doing really well with depression and anxiety, currently taking Cymbalta .  She also takes Topamax  for migraines.    05/01/2024    9:41 AM 01/31/2024    1:27 PM 11/01/2023    1:17 PM 08/01/2023   11:38 AM 05/01/2023    3:06 PM  Depression screen PHQ 2/9  Decreased Interest 0 0 0 0 0  Down, Depressed, Hopeless 0 0 0 0 0  PHQ - 2 Score 0 0 0 0 0  Altered sleeping 0  0 0 0  Tired, decreased energy 0  0 0 0  Change in appetite 0  0 0 0  Feeling bad or failure about yourself  0  0 0 0  Trouble concentrating 0  0 0 0  Moving slowly or fidgety/restless 0  0 0 0  Suicidal thoughts 0  0 0 0  PHQ-9 Score 0  0 0 0  Difficult doing work/chores Not difficult at all  Not difficult at all Not difficult at all Not difficult at all     Patient says her cycles have been keeping regular with the birth control and she does not like them but she feels like it is helping her body and feeling  better with her PCOS  Relevant past medical, surgical, family and social history reviewed and updated as indicated. Interim medical history since our last visit reviewed. Allergies and medications reviewed and updated.  Review of Systems  Constitutional:  Negative for chills and fever.  Eyes:  Negative for visual disturbance.  Respiratory:  Negative for chest tightness and shortness of breath.   Cardiovascular:  Negative for chest pain and leg swelling.  Genitourinary:  Negative for difficulty urinating and dysuria.  Musculoskeletal:  Negative for back pain and gait problem.  Skin:  Negative for rash.  Neurological:  Negative for dizziness, light-headedness and headaches.  Psychiatric/Behavioral:  Negative for agitation, behavioral problems, dysphoric mood, sleep disturbance and suicidal ideas. The patient is not nervous/anxious.   All other systems reviewed and are negative.   Per HPI unless specifically indicated above   Allergies as of 05/01/2024       Reactions   Bee Venom Shortness Of Breath   Penicillins Hives   Azithromycin  Other (See Comments)   Not effective   Cephalosporins Hives  Medication List        Accurate as of May 01, 2024 10:16 AM. If you have any questions, ask your nurse or doctor.          albuterol  108 (90 Base) MCG/ACT inhaler Commonly known as: VENTOLIN  HFA Inhale 2 puffs into the lungs every 6 (six) hours as needed for wheezing or shortness of breath.   cetirizine  10 MG tablet Commonly known as: ZYRTEC  Take 1 tablet (10 mg total) by mouth at bedtime.   DULoxetine  30 MG capsule Commonly known as: CYMBALTA  Take 1 capsule (30 mg total) by mouth at bedtime.   famotidine  20 MG tablet Commonly known as: Pepcid  Take 1 tablet (20 mg total) by mouth 2 (two) times daily for 14 days. What changed: when to take this   fluticasone  50 MCG/ACT nasal spray Commonly known as: FLONASE  palce TWO SPRAYS into BOTH nostrils DAILY   hydrOXYzine   25 MG capsule Commonly known as: VISTARIL  Take 1 capsule (25 mg total) by mouth every 8 (eight) hours as needed.   metFORMIN  500 MG tablet Commonly known as: GLUCOPHAGE  Take 1 tablet (500 mg total) by mouth 2 (two) times daily with a meal.   montelukast  10 MG tablet Commonly known as: SINGULAIR  Take 1 tablet (10 mg total) by mouth at bedtime.   norethindrone-ethinyl estradiol-iron 1.5-30 MG-MCG tablet Commonly known as: Larin Fe 1.5/30 TAKE 1 TABLET BY MOUTH DAILY. SKIP INACTIVE PILLS FOR TWO MONTHS,STARTING NEW PACK IMMEDIATELY, THEN TAKE FOR THIRD MONTH   promethazine  25 MG tablet Commonly known as: PHENERGAN  Take 1 tablet (25 mg total) by mouth every 6 (six) hours as needed for nausea or vomiting. May cause drowsiness   rizatriptan  10 MG tablet Commonly known as: MAXALT  TAKE 1 TABLET BY MOUTH AS NEEDED. MAY REPEAT IN TWO HOURS IF NEEDED   Semaglutide  (1 MG/DOSE) 4 MG/3ML Sopn Inject 1 mg as directed once a week.   topiramate  50 MG tablet Commonly known as: TOPAMAX  Take 1 tablet (50 mg total) by mouth 2 (two) times daily.   Ubrelvy  50 MG Tabs Generic drug: Ubrogepant  Take 50 mg by mouth as directed. Take at onset of headache, may repeat after 2 hours if headache still present. Max 2 tabs per day.         Objective:   BP 127/81   Pulse 83   Temp 98.2 F (36.8 C) (Temporal)   Ht 6' (1.829 m)   Wt 279 lb (126.6 kg)   SpO2 96%   BMI 37.84 kg/m   Wt Readings from Last 3 Encounters:  05/01/24 279 lb (126.6 kg)  03/04/24 266 lb 6.4 oz (120.8 kg)  02/27/24 260 lb (117.9 kg)    Physical Exam Vitals and nursing note reviewed.  Constitutional:      General: She is not in acute distress.    Appearance: She is well-developed. She is not diaphoretic.  Eyes:     Conjunctiva/sclera: Conjunctivae normal.  Cardiovascular:     Rate and Rhythm: Normal rate and regular rhythm.     Heart sounds: Normal heart sounds. No murmur heard. Pulmonary:     Effort: Pulmonary  effort is normal. No respiratory distress.     Breath sounds: Normal breath sounds. No wheezing.  Musculoskeletal:        General: No swelling.  Skin:    General: Skin is warm and dry.     Findings: No rash.  Neurological:     Mental Status: She is alert and oriented to person,  place, and time.     Coordination: Coordination normal.  Psychiatric:        Behavior: Behavior normal.       Assessment & Plan:   Problem List Items Addressed This Visit       Cardiovascular and Mediastinum   Migraine headache   Relevant Medications   rizatriptan  (MAXALT ) 10 MG tablet     Endocrine   Type 2 diabetes mellitus with other specified complication (HCC) - Primary   Relevant Medications   Semaglutide , 1 MG/DOSE, 4 MG/3ML SOPN   Other Relevant Orders   Bayer DCA Hb A1c Waived   CBC with Differential/Platelet   Lipid panel   CMP14+EGFR   Vitamin B12     Other   Depression, recurrent (HCC)    A1c 7.2 which is up from last time at 6.4.  She does admit that she accidentally during the past month bought the wrong case of sodas and it was not 0 sugar but it was normal sugar.  She says she still focusing on her diet really good.  Headaches doing well, only 1 in the past month, refill Maxalt  Follow up plan: Return in about 3 months (around 08/01/2024), or if symptoms worsen or fail to improve, for Diabetes.  Counseling provided for all of the vaccine components Orders Placed This Encounter  Procedures   Bayer DCA Hb A1c Waived   CBC with Differential/Platelet   Lipid panel   CMP14+EGFR   Vitamin B12    Fonda Levins, MD [facility] 05/01/2024, 10:16 AM

## 2024-05-02 LAB — CMP14+EGFR
ALT: 32 IU/L (ref 0–32)
AST: 20 IU/L (ref 0–40)
Albumin: 4.2 g/dL (ref 4.0–5.0)
Alkaline Phosphatase: 99 IU/L (ref 44–121)
BUN/Creatinine Ratio: 20 (ref 9–23)
BUN: 11 mg/dL (ref 6–20)
Bilirubin Total: 0.2 mg/dL (ref 0.0–1.2)
CO2: 20 mmol/L (ref 20–29)
Calcium: 9.2 mg/dL (ref 8.7–10.2)
Chloride: 103 mmol/L (ref 96–106)
Creatinine, Ser: 0.56 mg/dL — AB (ref 0.57–1.00)
Globulin, Total: 2.4 g/dL (ref 1.5–4.5)
Glucose: 140 mg/dL — AB (ref 70–99)
Potassium: 4.4 mmol/L (ref 3.5–5.2)
Sodium: 140 mmol/L (ref 134–144)
Total Protein: 6.6 g/dL (ref 6.0–8.5)
eGFR: 131 mL/min/1.73 (ref >59)

## 2024-05-02 LAB — CBC WITH DIFFERENTIAL/PLATELET
Basophils Absolute: 0 x10E3/uL (ref 0.0–0.2)
Basos: 0 %
EOS (ABSOLUTE): 0.1 x10E3/uL (ref 0.0–0.4)
Eos: 1 %
Hematocrit: 40.6 % (ref 34.0–46.6)
Hemoglobin: 12.7 g/dL (ref 11.1–15.9)
Immature Grans (Abs): 0.1 x10E3/uL (ref 0.0–0.1)
Immature Granulocytes: 1 %
Lymphocytes Absolute: 3 x10E3/uL (ref 0.7–3.1)
Lymphs: 27 %
MCH: 28.1 pg (ref 26.6–33.0)
MCHC: 31.3 g/dL — ABNORMAL LOW (ref 31.5–35.7)
MCV: 90 fL (ref 79–97)
Monocytes Absolute: 0.5 x10E3/uL (ref 0.1–0.9)
Monocytes: 5 %
Neutrophils Absolute: 7.3 x10E3/uL — ABNORMAL HIGH (ref 1.4–7.0)
Neutrophils: 66 %
Platelets: 345 x10E3/uL (ref 150–450)
RBC: 4.52 x10E6/uL (ref 3.77–5.28)
RDW: 13.2 % (ref 11.7–15.4)
WBC: 11 x10E3/uL — ABNORMAL HIGH (ref 3.4–10.8)

## 2024-05-02 LAB — LIPID PANEL
Cholesterol, Total: 170 mg/dL (ref 100–199)
HDL: 31 mg/dL — AB (ref >39)
LDL CALC COMMENT:: 5.5 ratio — AB (ref 0.0–4.4)
LDL Chol Calc (NIH): 107 mg/dL — AB (ref 0–99)
Triglycerides: 180 mg/dL — AB (ref 0–149)
VLDL Cholesterol Cal: 32 mg/dL (ref 5–40)

## 2024-05-02 LAB — VITAMIN B12: Vitamin B-12: 338 pg/mL (ref 232–1245)

## 2024-05-03 ENCOUNTER — Telehealth: Payer: Self-pay

## 2024-05-03 ENCOUNTER — Other Ambulatory Visit (HOSPITAL_COMMUNITY): Payer: Self-pay

## 2024-05-03 NOTE — Telephone Encounter (Signed)
 Pharmacy Patient Advocate Encounter   Received notification from Onbase that prior authorization for OZEMPIC  is required/requested.   Insurance verification completed.   The patient is insured through [facility] .   Per test claim: The current 28 day co-pay is, $4.  No PA needed at this time. This test claim was processed through [facility]- copay amounts may vary at other pharmacies due to pharmacy/plan contracts, or as the patient moves through the different stages of their insurance plan.      Called pharmacy. Pharmacy was trying to fill 3 month supply. Insurance will only allow 1 month at time. Pharmacy received a paid claim.

## 2024-05-08 ENCOUNTER — Ambulatory Visit: Payer: Self-pay | Admitting: Family Medicine

## 2024-07-22 ENCOUNTER — Encounter: Payer: Self-pay | Admitting: *Deleted

## 2024-08-01 ENCOUNTER — Ambulatory Visit: Admitting: Family Medicine

## 2024-08-02 ENCOUNTER — Ambulatory Visit: Admitting: Family Medicine

## 2024-08-02 ENCOUNTER — Encounter: Payer: Self-pay | Admitting: Family Medicine

## 2024-08-02 VITALS — BP 115/83 | HR 87 | Ht 72.0 in | Wt 278.0 lb

## 2024-08-02 DIAGNOSIS — E1169 Type 2 diabetes mellitus with other specified complication: Secondary | ICD-10-CM | POA: Diagnosis not present

## 2024-08-02 DIAGNOSIS — G43019 Migraine without aura, intractable, without status migrainosus: Secondary | ICD-10-CM

## 2024-08-02 DIAGNOSIS — Z7984 Long term (current) use of oral hypoglycemic drugs: Secondary | ICD-10-CM

## 2024-08-02 DIAGNOSIS — F339 Major depressive disorder, recurrent, unspecified: Secondary | ICD-10-CM

## 2024-08-02 DIAGNOSIS — E282 Polycystic ovarian syndrome: Secondary | ICD-10-CM | POA: Diagnosis not present

## 2024-08-02 LAB — BAYER DCA HB A1C WAIVED: HB A1C (BAYER DCA - WAIVED): 7.8 % — ABNORMAL HIGH (ref 4.8–5.6)

## 2024-08-02 NOTE — Progress Notes (Signed)
 BP 115/83   Pulse 87   Ht 6' (1.829 m)   Wt 278 lb (126.1 kg)   SpO2 98%   BMI 37.70 kg/m    Subjective:   Patient ID: Ruth King, female    DOB: Jan 08, 2001, 23 y.o.   MRN: [id number]  HPI: Ruth King is a 23 y.o. female presenting on 08/02/2024 for Medical Management of Chronic Issues and Diabetes   Discussed the use of AI scribe software for clinical note transcription with the patient, who gave verbal consent to proceed.  History of Present Illness   Ruth King is a 23 year old female with diabetes who presents for a recheck of her blood sugar levels.  Hyperglycemia and glycemic control - Diabetes mellitus with recent increase in hemoglobin A1c to 7.8% - Recent dietary modifications include cessation of soda, increased water intake, avoidance of pasta and potatoes, and occasional consumption of sandwiches due to financial limitations - Initiated regular exercise over the past month to improve glycemic control - Continues metformin  twice daily, with intermittent nausea as a side effect - Continues Ozempic  weekly without adverse effects  Cephalgia and migraine prophylaxis - On Cymbalta  for migraine prevention, resulting in decreased headache frequency - Stress identified as a trigger for remaining headaches - Prescribed Maxalt  and ubrelvy  for severe headaches, but infrequent need for use  Polycystic ovarian syndrome and menstrual regulation - Uses birth control for management of polycystic ovarian syndrome - Experiences regular menstrual cycles with current regimen  Allergic symptoms - Takes montelukast  for allergies without adverse effects  Physical activity and occupational status - Employment at [employer] involves regular physical activity including walking, squatting, and lifting, contributing to overall exercise routine          Relevant past medical, surgical, family and social history reviewed and updated as indicated. Interim medical history  since our last visit reviewed. Allergies and medications reviewed and updated.  Review of Systems  Constitutional:  Negative for chills and fever.  HENT:  Negative for congestion, ear discharge and ear pain.   Eyes:  Negative for redness and visual disturbance.  Respiratory:  Negative for chest tightness and shortness of breath.   Cardiovascular:  Negative for chest pain and leg swelling.  Genitourinary:  Negative for difficulty urinating and dysuria.  Musculoskeletal:  Negative for back pain and gait problem.  Skin:  Negative for rash.  Neurological:  Positive for headaches. Negative for dizziness and light-headedness.  Psychiatric/Behavioral:  Negative for agitation and behavioral problems.   All other systems reviewed and are negative.   Per HPI unless specifically indicated above   Allergies as of 08/02/2024       Reactions   Bee Venom Shortness Of Breath   Penicillins Hives   Azithromycin  Other (See Comments)   Not effective   Cephalosporins Hives        Medication List        Accurate as of August 02, 2024 10:06 AM. If you have any questions, ask your nurse or doctor.          albuterol  108 (90 Base) MCG/ACT inhaler Commonly known as: VENTOLIN  HFA Inhale 2 puffs into the lungs every 6 (six) hours as needed for wheezing or shortness of breath.   cetirizine  10 MG tablet Commonly known as: ZYRTEC  Take 1 tablet (10 mg total) by mouth at bedtime.   DULoxetine  30 MG capsule Commonly known as: CYMBALTA  Take 1 capsule (30 mg total) by mouth at bedtime.   famotidine   20 MG tablet Commonly known as: Pepcid  Take 1 tablet (20 mg total) by mouth 2 (two) times daily for 14 days. What changed: when to take this   fluticasone  50 MCG/ACT nasal spray Commonly known as: FLONASE  palce TWO SPRAYS into BOTH nostrils DAILY   hydrOXYzine  25 MG capsule Commonly known as: VISTARIL  Take 1 capsule (25 mg total) by mouth every 8 (eight) hours as needed.   metFORMIN  500 MG  tablet Commonly known as: GLUCOPHAGE  Take 1 tablet (500 mg total) by mouth 2 (two) times daily with a meal.   montelukast  10 MG tablet Commonly known as: SINGULAIR  Take 1 tablet (10 mg total) by mouth at bedtime.   norethindrone-ethinyl estradiol-iron 1.5-30 MG-MCG tablet Commonly known as: Larin Fe 1.5/30 TAKE 1 TABLET BY MOUTH DAILY. SKIP INACTIVE PILLS FOR TWO MONTHS,STARTING NEW PACK IMMEDIATELY, THEN TAKE FOR THIRD MONTH   promethazine  25 MG tablet Commonly known as: PHENERGAN  Take 1 tablet (25 mg total) by mouth every 6 (six) hours as needed for nausea or vomiting. May cause drowsiness   rizatriptan  10 MG tablet Commonly known as: MAXALT  TAKE 1 TABLET BY MOUTH AS NEEDED. MAY REPEAT IN TWO HOURS IF NEEDED   Semaglutide  (1 MG/DOSE) 4 MG/3ML Sopn Inject 1 mg as directed once a week.   topiramate  50 MG tablet Commonly known as: TOPAMAX  Take 1 tablet (50 mg total) by mouth 2 (two) times daily.   Ubrelvy  50 MG Tabs Generic drug: Ubrogepant  Take 50 mg by mouth as directed. Take at onset of headache, may repeat after 2 hours if headache still present. Max 2 tabs per day.         Objective:   BP 115/83   Pulse 87   Ht 6' (1.829 m)   Wt 278 lb (126.1 kg)   SpO2 98%   BMI 37.70 kg/m   Wt Readings from Last 3 Encounters:  08/02/24 278 lb (126.1 kg)  05/01/24 279 lb (126.6 kg)  03/04/24 266 lb 6.4 oz (120.8 kg)    Physical Exam Physical Exam   NECK: Thyroid without nodules. CHEST: Lungs clear to auscultation. CARDIOVASCULAR: Heart regular rate and rhythm. EXTREMITIES: No edema in legs.         Assessment & Plan:   Problem List Items Addressed This Visit       Cardiovascular and Mediastinum   Migraine headache     Endocrine   Type 2 diabetes mellitus with other specified complication (HCC) - Primary   Relevant Orders   Bayer DCA Hb A1c Waived   Vitamin B12   PCOS (polycystic ovarian syndrome)     Other   Depression, recurrent       Type 2  diabetes mellitus A1c increased to 7.8%. Dietary changes and exercise initiated. Metformin  causes gastrointestinal side effects; Ozempic  well tolerated. - Continue metformin  twice daily. - Continue Ozempic  1 mg weekly. - Reassess A1c in three months.  Migraine prophylaxis and management Cymbalta  effective in reducing headache frequency. Maxalt  and ubrelvy  available for acute management. Stress identified as a trigger. - Continue Cymbalta . - Continue Maxalt  and ubrelvy  as needed.  Polycystic ovarian syndrome Birth control effectively regulates menstrual cycles. - Continue birth control.          Follow up plan: Return in about 3 months (around 11/02/2024), or if symptoms worsen or fail to improve, for Diabetes .  Counseling provided for all of the vaccine components Orders Placed This Encounter  Procedures   Bayer DCA Hb A1c Waived   Vitamin  B12    Fonda Levins, MD [facility] 08/02/2024, 10:06 AM

## 2024-08-03 LAB — VITAMIN B12: Vitamin B-12: 533 pg/mL (ref 232–1245)

## 2024-08-09 ENCOUNTER — Ambulatory Visit: Payer: Self-pay | Admitting: Family Medicine

## 2024-11-04 ENCOUNTER — Ambulatory Visit: Admitting: Family Medicine

## 2024-12-18 ENCOUNTER — Ambulatory Visit: Admitting: Family Medicine
